# Patient Record
Sex: Female | Born: 1975 | Race: Black or African American | Hispanic: No | Marital: Single | Smoking: Current every day smoker
Health system: Southern US, Community
[De-identification: ages and names within clinical notes are randomized; demographics above are authoritative.]

## PROBLEM LIST (undated history)

## (undated) ENCOUNTER — Ambulatory Visit

## (undated) ENCOUNTER — Encounter

## (undated) ENCOUNTER — Telehealth

## (undated) ENCOUNTER — Encounter: Payer: MEDICARE | Attending: Registered" | Primary: Registered"

## (undated) ENCOUNTER — Encounter: Attending: Adult Health | Primary: Adult Health

## (undated) ENCOUNTER — Encounter: Attending: Emergency Medicine | Primary: Emergency Medicine

## (undated) ENCOUNTER — Encounter: Attending: Family | Primary: Family

## (undated) ENCOUNTER — Ambulatory Visit: Payer: MEDICARE

## (undated) ENCOUNTER — Encounter: Payer: MEDICARE | Attending: Emergency Medicine | Primary: Emergency Medicine

## (undated) ENCOUNTER — Ambulatory Visit
Attending: Student in an Organized Health Care Education/Training Program | Primary: Student in an Organized Health Care Education/Training Program

## (undated) ENCOUNTER — Ambulatory Visit: Attending: Adult Health | Primary: Adult Health

## (undated) ENCOUNTER — Ambulatory Visit: Payer: Medicare (Managed Care)

## (undated) ENCOUNTER — Telehealth: Attending: Registered" | Primary: Registered"

## (undated) ENCOUNTER — Encounter: Attending: Mental Health | Primary: Mental Health

## (undated) ENCOUNTER — Encounter: Payer: MEDICARE | Attending: Psychologist | Primary: Psychologist

## (undated) ENCOUNTER — Encounter: Attending: Diagnostic Radiology | Primary: Diagnostic Radiology

## (undated) ENCOUNTER — Ambulatory Visit: Payer: Medicare (Managed Care) | Attending: Gastroenterology | Primary: Gastroenterology

## (undated) ENCOUNTER — Encounter: Attending: Hematology & Oncology | Primary: Hematology & Oncology

## (undated) ENCOUNTER — Encounter: Payer: MEDICARE | Attending: Physical Medicine & Rehabilitation | Primary: Physical Medicine & Rehabilitation

## (undated) ENCOUNTER — Encounter: Payer: MEDICARE | Attending: Foot and Ankle Surgery | Primary: Foot and Ankle Surgery

## (undated) ENCOUNTER — Ambulatory Visit: Payer: MEDICARE | Attending: Adult Health | Primary: Adult Health

## (undated) ENCOUNTER — Encounter: Payer: MEDICARE | Attending: Adult Health | Primary: Adult Health

## (undated) ENCOUNTER — Telehealth: Attending: Family | Primary: Family

## (undated) ENCOUNTER — Encounter: Payer: Medicare (Managed Care) | Attending: Hematology & Oncology | Primary: Hematology & Oncology

## (undated) ENCOUNTER — Ambulatory Visit
Payer: MEDICARE | Attending: Student in an Organized Health Care Education/Training Program | Primary: Student in an Organized Health Care Education/Training Program

## (undated) ENCOUNTER — Encounter: Payer: Medicare (Managed Care) | Attending: Family Medicine | Primary: Family Medicine

## (undated) ENCOUNTER — Ambulatory Visit
Payer: Medicare (Managed Care) | Attending: Student in an Organized Health Care Education/Training Program | Primary: Student in an Organized Health Care Education/Training Program

## (undated) ENCOUNTER — Encounter: Attending: Family Medicine | Primary: Family Medicine

## (undated) ENCOUNTER — Ambulatory Visit: Payer: MEDICARE | Attending: Family | Primary: Family

## (undated) ENCOUNTER — Encounter
Attending: Student in an Organized Health Care Education/Training Program | Primary: Student in an Organized Health Care Education/Training Program

## (undated) ENCOUNTER — Encounter: Payer: MEDICARE | Attending: Family | Primary: Family

## (undated) ENCOUNTER — Ambulatory Visit: Payer: Medicare (Managed Care) | Attending: Adult Health | Primary: Adult Health

## (undated) ENCOUNTER — Telehealth: Attending: Physician Assistant | Primary: Physician Assistant

## (undated) ENCOUNTER — Ambulatory Visit: Payer: MEDICARE | Attending: Hematology & Oncology | Primary: Hematology & Oncology

## (undated) ENCOUNTER — Ambulatory Visit: Payer: Medicare (Managed Care) | Attending: Social Worker | Primary: Social Worker

## (undated) ENCOUNTER — Encounter: Attending: Registered" | Primary: Registered"

## (undated) ENCOUNTER — Ambulatory Visit: Payer: MEDICARE | Attending: Emergency Medicine | Primary: Emergency Medicine

## (undated) ENCOUNTER — Other Ambulatory Visit

## (undated) ENCOUNTER — Ambulatory Visit: Attending: Gastroenterology | Primary: Gastroenterology

## (undated) ENCOUNTER — Ambulatory Visit: Payer: Medicare (Managed Care) | Attending: Hematology & Oncology | Primary: Hematology & Oncology

## (undated) DIAGNOSIS — F419 Anxiety disorder, unspecified: Secondary | ICD-10-CM

## (undated) DIAGNOSIS — K449 Diaphragmatic hernia without obstruction or gangrene: Secondary | ICD-10-CM

## (undated) DIAGNOSIS — Z8679 Personal history of other diseases of the circulatory system: Secondary | ICD-10-CM

## (undated) DIAGNOSIS — F319 Bipolar disorder, unspecified: Secondary | ICD-10-CM

## (undated) DIAGNOSIS — J45909 Unspecified asthma, uncomplicated: Secondary | ICD-10-CM

## (undated) DIAGNOSIS — M810 Age-related osteoporosis without current pathological fracture: Secondary | ICD-10-CM

## (undated) DIAGNOSIS — F32A Depression, unspecified: Secondary | ICD-10-CM

## (undated) DIAGNOSIS — M5136 Other intervertebral disc degeneration, lumbar region: Secondary | ICD-10-CM

## (undated) DIAGNOSIS — E785 Hyperlipidemia, unspecified: Secondary | ICD-10-CM

## (undated) DIAGNOSIS — M51369 Other intervertebral disc degeneration, lumbar region without mention of lumbar back pain or lower extremity pain: Secondary | ICD-10-CM

## (undated) DIAGNOSIS — F329 Major depressive disorder, single episode, unspecified: Secondary | ICD-10-CM

## (undated) DIAGNOSIS — K589 Irritable bowel syndrome without diarrhea: Secondary | ICD-10-CM

## (undated) DIAGNOSIS — Z889 Allergy status to unspecified drugs, medicaments and biological substances status: Secondary | ICD-10-CM

## (undated) DIAGNOSIS — F4310 Post-traumatic stress disorder, unspecified: Secondary | ICD-10-CM

## (undated) HISTORY — DX: Age-related osteoporosis without current pathological fracture: M81.0

## (undated) HISTORY — DX: Post-traumatic stress disorder, unspecified: F43.10

## (undated) HISTORY — DX: Unspecified asthma, uncomplicated: J45.909

## (undated) HISTORY — DX: Irritable bowel syndrome, unspecified: K58.9

## (undated) HISTORY — DX: Diaphragmatic hernia without obstruction or gangrene: K44.9

## (undated) HISTORY — DX: Major depressive disorder, single episode, unspecified: F32.9

## (undated) HISTORY — PX: GALLBLADDER SURGERY: SHX652

## (undated) HISTORY — DX: Allergy status to unspecified drugs, medicaments and biological substances: Z88.9

## (undated) HISTORY — PX: TONSILLECTOMY AND ADENOIDECTOMY: SUR1326

## (undated) HISTORY — DX: Bipolar disorder, unspecified: F31.9

## (undated) HISTORY — DX: Anxiety disorder, unspecified: F41.9

## (undated) HISTORY — DX: Hyperlipidemia, unspecified: E78.5

## (undated) HISTORY — DX: Depression, unspecified: F32.A

## (undated) HISTORY — PX: ABDOMINAL HYSTERECTOMY: SUR658

## (undated) HISTORY — DX: Personal history of other diseases of the circulatory system: Z86.79

## (undated) HISTORY — PX: ABDOMINAL HYSTERECTOMY: SHX81

---
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
## 2003-10-24 ENCOUNTER — Other Ambulatory Visit: Payer: Self-pay

## 2003-12-28 ENCOUNTER — Other Ambulatory Visit: Payer: Self-pay

## 2003-12-29 ENCOUNTER — Other Ambulatory Visit: Payer: Self-pay

## 2003-12-30 ENCOUNTER — Other Ambulatory Visit: Payer: Self-pay

## 2003-12-31 ENCOUNTER — Other Ambulatory Visit: Payer: Self-pay

## 2004-01-01 ENCOUNTER — Other Ambulatory Visit: Payer: Self-pay

## 2004-05-17 ENCOUNTER — Emergency Department: Payer: Self-pay | Admitting: Emergency Medicine

## 2004-06-27 ENCOUNTER — Ambulatory Visit: Payer: Self-pay

## 2005-02-14 ENCOUNTER — Emergency Department: Payer: Self-pay | Admitting: Emergency Medicine

## 2005-05-12 ENCOUNTER — Emergency Department: Payer: Self-pay | Admitting: Emergency Medicine

## 2006-01-12 ENCOUNTER — Emergency Department: Payer: Self-pay | Admitting: Emergency Medicine

## 2006-01-12 ENCOUNTER — Other Ambulatory Visit: Payer: Self-pay

## 2006-03-02 ENCOUNTER — Emergency Department: Payer: Self-pay | Admitting: Emergency Medicine

## 2006-05-19 ENCOUNTER — Emergency Department: Payer: Self-pay | Admitting: Emergency Medicine

## 2006-06-14 ENCOUNTER — Emergency Department: Payer: Self-pay | Admitting: Emergency Medicine

## 2006-06-22 ENCOUNTER — Emergency Department: Payer: Self-pay | Admitting: Emergency Medicine

## 2006-06-25 ENCOUNTER — Emergency Department: Payer: Self-pay | Admitting: Emergency Medicine

## 2006-06-25 ENCOUNTER — Other Ambulatory Visit: Payer: Self-pay

## 2006-08-24 ENCOUNTER — Emergency Department: Payer: Self-pay | Admitting: General Practice

## 2006-10-09 ENCOUNTER — Encounter: Admission: RE | Admit: 2006-10-09 | Discharge: 2006-10-09 | Payer: Self-pay | Admitting: Family Medicine

## 2006-10-23 ENCOUNTER — Ambulatory Visit: Payer: Self-pay | Admitting: Unknown Physician Specialty

## 2006-10-24 ENCOUNTER — Emergency Department: Payer: Self-pay | Admitting: Emergency Medicine

## 2006-10-30 ENCOUNTER — Emergency Department: Payer: Self-pay | Admitting: Emergency Medicine

## 2006-11-29 ENCOUNTER — Emergency Department: Payer: Self-pay | Admitting: Emergency Medicine

## 2007-03-13 ENCOUNTER — Emergency Department: Payer: Self-pay | Admitting: Emergency Medicine

## 2007-06-04 ENCOUNTER — Emergency Department: Payer: Self-pay | Admitting: Emergency Medicine

## 2007-07-14 ENCOUNTER — Ambulatory Visit: Payer: Self-pay | Admitting: Gastroenterology

## 2007-09-20 ENCOUNTER — Emergency Department: Payer: Self-pay | Admitting: Internal Medicine

## 2007-12-05 ENCOUNTER — Emergency Department: Payer: Self-pay | Admitting: Emergency Medicine

## 2007-12-06 ENCOUNTER — Inpatient Hospital Stay: Payer: Self-pay | Admitting: Surgery

## 2009-07-26 ENCOUNTER — Emergency Department: Payer: Self-pay | Admitting: Unknown Physician Specialty

## 2009-11-21 ENCOUNTER — Emergency Department: Payer: Self-pay | Admitting: Emergency Medicine

## 2010-03-13 ENCOUNTER — Inpatient Hospital Stay: Payer: Self-pay | Admitting: Internal Medicine

## 2010-07-07 ENCOUNTER — Encounter: Payer: Self-pay | Admitting: Family Medicine

## 2011-03-07 ENCOUNTER — Emergency Department: Payer: Self-pay | Admitting: Internal Medicine

## 2011-05-23 ENCOUNTER — Emergency Department: Payer: Self-pay | Admitting: Emergency Medicine

## 2011-09-19 ENCOUNTER — Emergency Department: Payer: Self-pay | Admitting: Emergency Medicine

## 2011-12-04 ENCOUNTER — Ambulatory Visit: Payer: Self-pay | Admitting: Family Medicine

## 2012-05-11 ENCOUNTER — Emergency Department: Payer: Self-pay | Admitting: Emergency Medicine

## 2012-11-27 ENCOUNTER — Emergency Department: Payer: Self-pay | Admitting: Emergency Medicine

## 2012-11-27 LAB — BASIC METABOLIC PANEL
Creatinine: 0.93 mg/dL (ref 0.60–1.30)
EGFR (African American): >60
EGFR (Non-African Amer.): >60
Glucose: 95 mg/dL (ref 65–99)
Sodium: 140 mmol/L (ref 136–145)

## 2012-11-29 ENCOUNTER — Emergency Department: Payer: Self-pay | Admitting: Emergency Medicine

## 2013-01-30 ENCOUNTER — Emergency Department: Payer: Self-pay | Admitting: Internal Medicine

## 2013-01-30 LAB — URINALYSIS, COMPLETE
Bacteria: NONE SEEN
Bilirubin,UR: NEGATIVE
Glucose,UR: NEGATIVE mg/dL (ref 0–75)
Ph: 7 (ref 4.5–8.0)
Protein: 30
RBC,UR: 34 /HPF (ref 0–5)
Specific Gravity: 1.021 (ref 1.003–1.030)

## 2013-01-30 LAB — COMPREHENSIVE METABOLIC PANEL
Anion Gap: 3 — ABNORMAL LOW (ref 7–16)
BUN: 11 mg/dL (ref 7–18)
Calcium, Total: 9.2 mg/dL (ref 8.5–10.1)
Chloride: 109 mmol/L — ABNORMAL HIGH (ref 98–107)
Co2: 29 mmol/L (ref 21–32)
Creatinine: 0.86 mg/dL (ref 0.60–1.30)
Glucose: 91 mg/dL (ref 65–99)
Potassium: 4.1 mmol/L (ref 3.5–5.1)
SGOT(AST): 23 U/L (ref 15–37)

## 2013-01-30 LAB — CBC
HCT: 42.6 % (ref 35.0–47.0)
MCV: 92 fL (ref 80–100)
Platelet: 276 10*3/uL (ref 150–440)

## 2014-07-20 ENCOUNTER — Emergency Department: Payer: Self-pay | Admitting: Emergency Medicine

## 2014-07-20 LAB — URINALYSIS, COMPLETE
BACTERIA: NONE SEEN
Bilirubin,UR: NEGATIVE
GLUCOSE, UR: NEGATIVE mg/dL (ref 0–75)
KETONE: NEGATIVE
Leukocyte Esterase: NEGATIVE
NITRITE: NEGATIVE
PROTEIN: NEGATIVE
Ph: 7 (ref 4.5–8.0)
RBC,UR: 3 /HPF (ref 0–5)
SPECIFIC GRAVITY: 1.009 (ref 1.003–1.030)
Squamous Epithelial: 3
WBC UR: 1 /HPF (ref 0–5)

## 2014-07-20 LAB — CBC WITH DIFFERENTIAL/PLATELET
BASOS ABS: 0.1 10*3/uL (ref 0.0–0.1)
Basophil %: 1.6 %
Eosinophil #: 0.1 10*3/uL (ref 0.0–0.7)
Eosinophil %: 0.8 %
HCT: 43.9 % (ref 35.0–47.0)
HGB: 14.6 g/dL (ref 12.0–16.0)
LYMPHS PCT: 31.8 %
Lymphocyte #: 2.9 10*3/uL (ref 1.0–3.6)
MCH: 31 pg (ref 26.0–34.0)
MCHC: 33.3 g/dL (ref 32.0–36.0)
MCV: 93 fL (ref 80–100)
MONOS PCT: 7.8 %
Monocyte #: 0.7 x10 3/mm (ref 0.2–0.9)
NEUTROS PCT: 58 %
Neutrophil #: 5.2 10*3/uL (ref 1.4–6.5)
PLATELETS: 272 10*3/uL (ref 150–440)
RBC: 4.7 10*6/uL (ref 3.80–5.20)
RDW: 12.4 % (ref 11.5–14.5)
WBC: 9 10*3/uL (ref 3.6–11.0)

## 2014-07-20 LAB — COMPREHENSIVE METABOLIC PANEL
ALK PHOS: 54 U/L (ref 46–116)
ALT: 23 U/L (ref 14–63)
ANION GAP: 6 — AB (ref 7–16)
Albumin: 3.3 g/dL — ABNORMAL LOW (ref 3.4–5.0)
BILIRUBIN TOTAL: 0.5 mg/dL (ref 0.2–1.0)
BUN: 12 mg/dL (ref 7–18)
CALCIUM: 9 mg/dL (ref 8.5–10.1)
CO2: 27 mmol/L (ref 21–32)
CREATININE: 0.78 mg/dL (ref 0.60–1.30)
Chloride: 107 mmol/L (ref 98–107)
Glucose: 90 mg/dL (ref 65–99)
OSMOLALITY: 279 (ref 275–301)
Potassium: 4.1 mmol/L (ref 3.5–5.1)
SGOT(AST): 19 U/L (ref 15–37)
Sodium: 140 mmol/L (ref 136–145)
Total Protein: 7.4 g/dL (ref 6.4–8.2)

## 2014-07-20 LAB — PROTIME-INR
INR: 0.9
PROTHROMBIN TIME: 12.4 s

## 2014-07-20 LAB — APTT: Activated PTT: 27.1 secs (ref 23.6–35.9)

## 2014-07-20 LAB — TROPONIN I

## 2014-11-24 ENCOUNTER — Ambulatory Visit: Payer: Self-pay | Admitting: Cardiovascular Disease

## 2014-12-28 ENCOUNTER — Encounter: Payer: Self-pay | Admitting: *Deleted

## 2014-12-28 ENCOUNTER — Ambulatory Visit: Payer: Self-pay | Admitting: Cardiovascular Disease

## 2015-01-16 ENCOUNTER — Telehealth: Payer: Self-pay

## 2015-01-16 NOTE — Telephone Encounter (Signed)
l mom for pt to r/s no show appt

## 2015-02-01 ENCOUNTER — Ambulatory Visit: Payer: Self-pay | Admitting: Cardiovascular Disease

## 2015-03-14 ENCOUNTER — Ambulatory Visit: Payer: Self-pay | Admitting: Cardiovascular Disease

## 2015-03-26 ENCOUNTER — Encounter: Payer: Self-pay | Admitting: Cardiovascular Disease

## 2015-03-26 ENCOUNTER — Ambulatory Visit (INDEPENDENT_AMBULATORY_CARE_PROVIDER_SITE_OTHER): Payer: Medicare HMO | Admitting: Cardiovascular Disease

## 2015-03-26 VITALS — BP 110/74 | HR 58 | Ht 65.0 in | Wt 293.2 lb

## 2015-03-26 DIAGNOSIS — I2584 Coronary atherosclerosis due to calcified coronary lesion: Secondary | ICD-10-CM

## 2015-03-26 DIAGNOSIS — Z8679 Personal history of other diseases of the circulatory system: Secondary | ICD-10-CM | POA: Diagnosis not present

## 2015-03-26 DIAGNOSIS — R0602 Shortness of breath: Secondary | ICD-10-CM | POA: Diagnosis not present

## 2015-03-26 DIAGNOSIS — I2510 Atherosclerotic heart disease of native coronary artery without angina pectoris: Secondary | ICD-10-CM
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
## 2015-03-26 NOTE — Patient Instructions (Addendum)
Medication Instructions:  Your physician recommends that you continue on your current medications as directed. Please refer to the Current Medication list given to you today.   Labwork: none  Testing/Procedures: Your physician has requested that you have an echocardiogram. Echocardiography is a painless test that uses sound waves to create images of your heart. It provides your doctor with information about the size and shape of your heart and how well your heart's chambers and valves are working. This procedure takes approximately one hour. There are no restrictions for this procedure.  Your physician has requested that you have a lexiscan myoview. For further information please visit [email or web address] Please follow instruction sheet, as given.  [facility] MYOVIEW  Your caregiver has ordered a Stress Test with nuclear imaging. The purpose of this test is to evaluate the blood supply to your heart muscle. This procedure is referred to as a "Non-Invasive Stress Test." This is because other than having an IV started in your vein, nothing is inserted or "invades" your body. Cardiac stress tests are done to find areas of poor blood flow to the heart by determining the extent of coronary artery disease (CAD). Some patients exercise on a treadmill, which naturally increases the blood flow to your heart, while others who are  unable to walk on a treadmill due to physical limitations have a pharmacologic/chemical stress agent called Lexiscan . This medicine will mimic walking on a treadmill by temporarily increasing your coronary blood flow.   Please note: these test may take anywhere between 2-4 hours to complete  PLEASE REPORT TO [facility] ENTRANCE  THE VOLUNTEERS AT THE FIRST DESK WILL DIRECT YOU WHERE TO GO  Date of Procedure: Mon and Tues, Oct 17 and 18, 8:30am  Arrival Time for Procedure: 8:00am  Instructions regarding medication:   You may take all of your morning medications with a  sip of water  PLEASE NOTIFY THE OFFICE AT LEAST 24 HOURS IN ADVANCE IF YOU ARE UNABLE TO KEEP YOUR APPOINTMENT.  [phone] AND  PLEASE NOTIFY NUCLEAR MEDICINE AT [facility] AT LEAST 24 HOURS IN ADVANCE IF YOU ARE UNABLE TO KEEP YOUR APPOINTMENT. [phone]  How to prepare for your Myoview test:   Do not eat or drink after midnight  No caffeine for 24 hours prior to test  No smoking 24 hours prior to test.  Your medication may be taken with water.  If your doctor stopped a medication because of this test, do not take that medication.  Ladies, please do not wear dresses.  Skirts or pants are appropriate. Please wear a short sleeve shirt.  No perfume, cologne or lotion.  Wear comfortable walking shoes. No heels!            Follow-Up: Your physician recommends that you schedule a follow-up appointment with Dr. Elease Hashimoto as needed   Any Other Special Instructions Will Be Listed Below (If Applicable).  Echocardiogram An echocardiogram, or echocardiography, uses sound waves (ultrasound) to produce an image of your heart. The echocardiogram is simple, painless, obtained within a short period of time, and offers valuable information to your health care provider. The images from an echocardiogram can provide information such as:  Evidence of coronary artery disease (CAD).  Heart size.  Heart muscle function.  Heart valve function.  Aneurysm detection.  Evidence of a past heart attack.  Fluid buildup around the heart.  Heart muscle thickening.  Assess heart valve function. LET [facility] CARE PROVIDER KNOW ABOUT:  Any allergies you have.  All medicines you are taking, including vitamins, herbs, eye drops, creams, and over-the-counter medicines.  Previous problems you or members of your family have had with the use of anesthetics.  Any blood disorders you have.  Previous surgeries you have had.  Medical conditions you have.  Possibility of pregnancy, if this  applies. BEFORE THE PROCEDURE  No special preparation is needed. Eat and drink normally.  PROCEDURE   In order to produce an image of your heart, gel will be applied to your chest and a wand-like tool (transducer) will be moved over your chest. The gel will help transmit the sound waves from the transducer. The sound waves will harmlessly bounce off your heart to allow the heart images to be captured in real-time motion. These images will then be recorded.  You may need an IV to receive a medicine that improves the quality of the pictures. AFTER THE PROCEDURE You may return to your normal schedule including diet, activities, and medicines, unless your health care provider tells you otherwise.   This information is not intended to replace advice given to you by your health care provider. Make sure you discuss any questions you have with your health care provider.   Document Released: 05/30/2000 Document Revised: 06/23/2014 Document Reviewed: 02/07/2013 Elsevier Interactive Patient Education [employer]

## 2015-03-30 ENCOUNTER — Telehealth: Payer: Self-pay

## 2015-03-30 NOTE — Telephone Encounter (Signed)
Left detailed message on pt cell regarding 10/17 and 10/18 myoview. Reviewed instructions, left CB number if questions.

## 2015-04-02 ENCOUNTER — Ambulatory Visit: Payer: Medicare HMO

## 2015-04-02 ENCOUNTER — Ambulatory Visit: Admission: RE | Admit: 2015-04-02 | Payer: Medicare HMO | Source: Ambulatory Visit

## 2015-04-03 ENCOUNTER — Ambulatory Visit: Admission: RE | Admit: 2015-04-03 | Payer: Medicare HMO | Source: Ambulatory Visit

## 2015-04-16 ENCOUNTER — Encounter: Payer: Self-pay | Admitting: *Deleted

## 2015-04-16 ENCOUNTER — Other Ambulatory Visit: Payer: Self-pay

## 2015-04-16 DIAGNOSIS — R0989 Other specified symptoms and signs involving the circulatory and respiratory systems: Secondary | ICD-10-CM

## 2015-05-20 ENCOUNTER — Emergency Department
Admission: EM | Admit: 2015-05-20 | Discharge: 2015-05-20 | Disposition: A | Discharge status: Home / Self Care | Payer: Medicare HMO | Attending: Emergency Medicine | Admitting: Emergency Medicine

## 2015-05-20 ENCOUNTER — Encounter: Payer: Self-pay | Admitting: Emergency Medicine

## 2015-05-20 DIAGNOSIS — E785 Hyperlipidemia, unspecified: Secondary | ICD-10-CM | POA: Diagnosis not present

## 2015-05-20 DIAGNOSIS — Z79899 Other long term (current) drug therapy: Secondary | ICD-10-CM | POA: Insufficient documentation

## 2015-05-20 DIAGNOSIS — M5416 Radiculopathy, lumbar region: Secondary | ICD-10-CM | POA: Diagnosis not present

## 2015-05-20 DIAGNOSIS — M545 Low back pain: Secondary | ICD-10-CM | POA: Diagnosis present

## 2015-05-20 DIAGNOSIS — F17210 Nicotine dependence, cigarettes, uncomplicated: Secondary | ICD-10-CM | POA: Diagnosis not present

## 2015-05-20 DIAGNOSIS — Z880 Allergy status to penicillin: Secondary | ICD-10-CM | POA: Diagnosis not present

## 2015-05-20 DIAGNOSIS — Z792 Long term (current) use of antibiotics: Secondary | ICD-10-CM | POA: Diagnosis not present

## 2015-05-20 HISTORY — DX: Other intervertebral disc degeneration, lumbar region: M51.36

## 2015-05-20 HISTORY — DX: Other intervertebral disc degeneration, lumbar region without mention of lumbar back pain or lower extremity pain: M51.369

## 2015-05-20 MED ORDER — OXYCODONE-ACETAMINOPHEN 7.5-325 MG PO TABS: 1.0000 | ORAL_TABLET | ORAL | Status: DC | PRN

## 2015-05-20 MED ORDER — METHOCARBAMOL 500 MG PO TABS
1000.0000 mg | ORAL_TABLET | Freq: Once | ORAL | Status: AC
Start: 2015-05-20 — End: 2015-05-20
  Administered 2015-05-20: 1000 mg via ORAL
  Filled 2015-05-20: qty 2

## 2015-05-20 MED ORDER — METHYLPREDNISOLONE 4 MG PO TBPK: ORAL_TABLET | ORAL | Status: DC

## 2015-05-20 MED ORDER — DEXAMETHASONE SODIUM PHOSPHATE 10 MG/ML IJ SOLN
10.0000 mg | Freq: Once | INTRAMUSCULAR | Status: AC
  Administered 2015-05-20: 10 mg via INTRAMUSCULAR
  Filled 2015-05-20: qty 1

## 2015-05-20 MED ORDER — HYDROMORPHONE HCL 1 MG/ML IJ SOLN
1.0000 mg | Freq: Once | INTRAMUSCULAR | Status: AC
  Administered 2015-05-20: 1 mg via INTRAMUSCULAR
  Filled 2015-05-20: qty 1

## 2015-05-20 MED ORDER — ORPHENADRINE CITRATE ER 100 MG PO TB12: 100.0000 mg | ORAL_TABLET | Freq: Two times a day (BID) | ORAL | Status: DC

## 2015-05-20 NOTE — Discharge Instructions (Signed)
Radicular Pain °Radicular pain in either the arm or leg is usually from a bulging or herniated disk in the spine. A piece of the herniated disk may press against the nerves as the nerves exit the spine. This causes pain which is felt at the tips of the nerves down the arm or leg. Other causes of radicular pain may include: °· Fractures. °· Heart disease. °· Cancer. °· An abnormal and usually degenerative state of the nervous system or nerves (neuropathy). °Diagnosis may require CT or MRI scanning to determine the primary cause.  °Nerves that start at the neck (nerve roots) may cause radicular pain in the outer shoulder and arm. It can spread down to the thumb and fingers. The symptoms vary depending on which nerve root has been affected. In most cases radicular pain improves with conservative treatment. Neck problems may require physical therapy, a neck collar, or cervical traction. Treatment may take many weeks, and surgery may be considered if the symptoms do not improve.  °Conservative treatment is also recommended for sciatica. Sciatica causes pain to radiate from the lower back or buttock area down the leg into the foot. Often there is a history of back problems. Most patients with sciatica are better after 2 to 4 weeks of rest and other supportive care. Short term bed rest can reduce the disk pressure considerably. Sitting, however, is not a good position since this increases the pressure on the disk. You should avoid bending, lifting, and all other activities which make the problem worse. Traction can be used in severe cases. Surgery is usually reserved for patients who do not improve within the first months of treatment. °Only take over-the-counter or prescription medicines for pain, discomfort, or fever as directed by your caregiver. Narcotics and muscle relaxants may help by relieving more severe pain and spasm and by providing mild sedation. Cold or massage can give significant relief. Spinal manipulation  is not recommended. It can increase the degree of disc protrusion. Epidural steroid injections are often effective treatment for radicular pain. These injections deliver medicine to the spinal nerve in the space between the protective covering of the spinal cord and back bones (vertebrae). Your caregiver can give you more information about steroid injections. These injections are most effective when given within two weeks of the onset of pain.  °You should see your caregiver for follow up care as recommended. A program for neck and back injury rehabilitation with stretching and strengthening exercises is an important part of management.  °SEEK IMMEDIATE MEDICAL CARE IF: °· You develop increased pain, weakness, or numbness in your arm or leg. °· You develop difficulty with bladder or bowel control. °· You develop abdominal pain. °  °This information is not intended to replace advice given to you by your health care provider. Make sure you discuss any questions you have with your health care provider. °  °Document Released: 07/10/2004 Document Revised: 06/23/2014 Document Reviewed: 12/27/2014 °Elsevier Interactive Patient Education ©2016 Elsevier Inc. ° °

## 2015-05-20 NOTE — ED Notes (Signed)
Pt reports pain in lower back and both legs after turning while moping the floor.  Pt states numbness and pain in both legs.  Pt did not fall.
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
## 2015-06-07 ENCOUNTER — Other Ambulatory Visit: Payer: Medicare HMO

## 2015-06-07 ENCOUNTER — Other Ambulatory Visit: Payer: Self-pay | Admitting: Cardiovascular Disease

## 2015-06-07 DIAGNOSIS — Z8679 Personal history of other diseases of the circulatory system: Secondary | ICD-10-CM

## 2015-06-07 DIAGNOSIS — I059 Rheumatic mitral valve disease, unspecified: Secondary | ICD-10-CM

## 2015-06-16 ENCOUNTER — Emergency Department
Admission: EM | Admit: 2015-06-16 | Discharge: 2015-06-16 | Disposition: A | Discharge status: Home / Self Care | Payer: Medicare HMO | Attending: Emergency Medicine | Admitting: Emergency Medicine

## 2015-06-16 ENCOUNTER — Emergency Department: Payer: Medicare HMO

## 2015-06-16 DIAGNOSIS — J209 Acute bronchitis, unspecified: Secondary | ICD-10-CM

## 2015-06-16 DIAGNOSIS — F17210 Nicotine dependence, cigarettes, uncomplicated: Secondary | ICD-10-CM | POA: Diagnosis not present

## 2015-06-16 DIAGNOSIS — Z7951 Long term (current) use of inhaled steroids: Secondary | ICD-10-CM | POA: Diagnosis not present

## 2015-06-16 DIAGNOSIS — Z792 Long term (current) use of antibiotics: Secondary | ICD-10-CM | POA: Insufficient documentation

## 2015-06-16 DIAGNOSIS — R1110 Vomiting, unspecified: Secondary | ICD-10-CM | POA: Diagnosis not present

## 2015-06-16 DIAGNOSIS — Z79899 Other long term (current) drug therapy: Secondary | ICD-10-CM | POA: Insufficient documentation

## 2015-06-16 DIAGNOSIS — J45901 Unspecified asthma with (acute) exacerbation: Secondary | ICD-10-CM | POA: Insufficient documentation

## 2015-06-16 DIAGNOSIS — Z880 Allergy status to penicillin: Secondary | ICD-10-CM | POA: Diagnosis not present

## 2015-06-16 DIAGNOSIS — R05 Cough: Secondary | ICD-10-CM | POA: Diagnosis present

## 2015-06-16 MED ORDER — IPRATROPIUM-ALBUTEROL 0.5-2.5 (3) MG/3ML IN SOLN
RESPIRATORY_TRACT | Status: AC
  Filled 2015-06-16: qty 3

## 2015-06-16 MED ORDER — PREDNISONE 20 MG PO TABS
60.0000 mg | ORAL_TABLET | Freq: Once | ORAL | Status: AC
  Administered 2015-06-16: 60 mg via ORAL
  Filled 2015-06-16: qty 3

## 2015-06-16 MED ORDER — IPRATROPIUM-ALBUTEROL 0.5-2.5 (3) MG/3ML IN SOLN: 3.0000 mL | RESPIRATORY_TRACT | Status: DC | PRN

## 2015-06-16 MED ORDER — PREDNISONE 10 MG PO TABS: ORAL_TABLET | ORAL | Status: DC

## 2015-06-16 MED ORDER — IPRATROPIUM-ALBUTEROL 0.5-2.5 (3) MG/3ML IN SOLN
3.0000 mL | Freq: Once | RESPIRATORY_TRACT | Status: AC
  Administered 2015-06-16: 3 mL via RESPIRATORY_TRACT
  Filled 2015-06-16: qty 3

## 2015-06-16 MED ORDER — HYDROCOD POLST-CPM POLST ER 10-8 MG/5ML PO SUER: 5.0000 mL | Freq: Two times a day (BID) | ORAL | Status: DC

## 2015-06-16 MED ORDER — IPRATROPIUM-ALBUTEROL 0.5-2.5 (3) MG/3ML IN SOLN
3.0000 mL | Freq: Once | RESPIRATORY_TRACT | Status: AC
  Administered 2015-06-16: 3 mL via RESPIRATORY_TRACT

## 2015-06-16 MED ORDER — AZITHROMYCIN 250 MG PO TABS: ORAL_TABLET | ORAL | Status: DC

## 2015-06-16 NOTE — ED Notes (Signed)
Pt here for cough, congestion, vomiting that has been ongoing since Monday.  Has been taking antibiotics for sinus infection.

## 2015-06-16 NOTE — ED Provider Notes (Signed)
[facility] Emergency Department Provider Note  ____________________________________________  Time seen: Approximately 11:08 AM  I have reviewed the triage vital signs and the nursing notes.   HISTORY  Chief Complaint Cough; Nasal Congestion; and Emesis  HPI Kendra Clark is a 39 y.o. female is here with complaint of cough and congestion for 5 days. Patient states that she has vomiting only with coughing. She denies any diarrhea. She states she has been running fever at home but has not actually taken it. She has been taking multiple over-the-counter medications for cough and congestion. Patient is an asthmatic and has continued using her inhalers. Patient continues to smoke as well.She states that she used her inhaler prior to coming to the emergency room. She states her chest hurts with coughing, talking and taking deep breaths. Patient had leftover amoxicillin from a dental visit and began taking it 3 days ago. She is now out of antibiotics.   Past Medical History  Diagnosis Date  . History of mitral valve disease   . IBS (irritable bowel syndrome)   . Hyperlipidemia   . Osteoporosis   . Depression   . Asthma   . Hiatal hernia   . Atopy   . Degenerative disc disease, lumbar     Patient Active Problem List   Diagnosis Date Noted  . Coronary artery calcification 03/26/2015  . Morbid obesity (HCC) 03/26/2015    Past Surgical History  Procedure Laterality Date  . Abdominal hysterectomy    . Cesarean section      x 3     Current Outpatient Rx  Name  Route  Sig  Dispense  Refill  . AMOXICILLIN PO   Oral   Take by mouth.         Marland Kitchen azithromycin (ZITHROMAX Z-PAK) 250 MG tablet      Take 2 tablets (500 mg) on  Day 1,  followed by 1 tablet (250 mg) once daily on Days 2 through 5.   6 each   0   . chlorpheniramine-HYDROcodone (TUSSIONEX PENNKINETIC ER) 10-8 MG/5ML SUER   Oral   Take 5 mLs by mouth 2 (two) times daily.   100 mL   0   .  clindamycin (CLEOCIN) 300 MG capsule   Oral   Take 300 mg by mouth 2 (two) times daily.          . fluconazole (DIFLUCAN) 150 MG tablet   Oral   Take 150 mg by mouth once.          Marland Kitchen ibuprofen (ADVIL,MOTRIN) 600 MG tablet   Oral   Take by mouth every 6 (six) hours as needed.          Marland Kitchen ipratropium-albuterol (DUONEB) 0.5-2.5 (3) MG/3ML SOLN   Nebulization   Take 3 mLs by nebulization every 4 (four) hours as needed.   360 mL   0   . lubiprostone (AMITIZA) 24 MCG capsule   Oral   Take 24 mcg by mouth 2 (two) times daily with a meal.          . methylPREDNISolone (MEDROL DOSEPAK) 4 MG TBPK tablet      Take Tapered dose as directed   21 tablet   0   . orphenadrine (NORFLEX) 100 MG tablet   Oral   Take 1 tablet (100 mg total) by mouth 2 (two) times daily.   10 tablet   0   . oxyCODONE-acetaminophen (PERCOCET) 7.5-325 MG tablet   Oral   Take 1  tablet by mouth every 4 (four) hours as needed for severe pain.   20 tablet   0   . predniSONE (DELTASONE) 10 MG tablet      Take 5 tablets  tomorrow, on day 2 take 4 tablets, day 3 take 3 tablets, day 4 take 2 tablets, day 5 take  1 tablets   21 tablet   0     Allergies Penicillins and Tramadol  Family History  Problem Relation Age of Onset  . Hypertension Father   . Hyperlipidemia Father   . Hypertension Mother   . Hyperlipidemia Mother   . Arrhythmia Mother     A-fib    Social History Social History  Substance Use Topics  . Smoking status: Current Every Day Smoker -- 1.50 packs/day for 13 years    Types: Cigarettes  . Smokeless tobacco: Not on file  . Alcohol Use: No    Review of Systems Constitutional: Positive fever/chills Eyes: No visual changes. ENT: No sore throat. Cardiovascular: Denies chest pain. Respiratory: Positive shortness of breath and wheezing. Gastrointestinal: No abdominal pain.  No nausea, positive vomiting only with coughing.  No diarrhea.   Genitourinary: Negative for  dysuria. Musculoskeletal: Negative for back pain. Skin: Negative for rash. Neurological: Negative for headaches, focal weakness or numbness.  10-point ROS otherwise negative.  ____________________________________________   PHYSICAL EXAM:  VITAL SIGNS: ED Triage Vitals  Enc Vitals Group     BP 06/16/15 1056 124/68 mmHg     Pulse Rate 06/16/15 1055 74     Resp 06/16/15 1055 20     Temp --      Temp src --      SpO2 06/16/15 1055 95 %     Weight 06/16/15 1055 280 lb (127.007 kg)     Height 06/16/15 1055  (1.651 m)     Head Cir --      Peak Flow --      Pain Score 06/16/15 1059 9     Pain Loc --      Pain Edu? --      Excl. in GC? --     Constitutional: Alert and oriented. Well appearing and in no acute distress. Eyes: Conjunctivae are normal. PERRL. EOMI. Head: Atraumatic. Nose: Mild congestion/rhinnorhea.    EACs and TMs are clear bilaterally. Mouth/Throat: Mucous membranes are moist.  Oropharynx non-erythematous. Neck: No stridor.  Supple Hematological/Lymphatic/Immunilogical: No cervical lymphadenopathy. Cardiovascular: Normal rate, regular rhythm. Grossly normal heart sounds.  Good peripheral circulation. Respiratory: Normal respiratory effort.  No retractions or accessory muscles. Lungs bilateral expiratory wheezes heard throughout. Congestive cough. Gastrointestinal: Soft and nontender. No distention.  Musculoskeletal: No lower extremity tenderness nor edema.  No joint effusions. Neurologic:  Normal speech and language. No gross focal neurologic deficits are appreciated. No gait instability. Skin:  Skin is warm, dry and intact. No rash noted. Psychiatric: Mood and affect are normal. Speech and behavior are normal.  ____________________________________________   LABS (all labs ordered are listed, but only abnormal results are displayed)  Labs Reviewed - No data to display RADIOLOGY  X-rays x-ray per radiologist shows bronchitic changes. I, Tommi Rumps, personally viewed and evaluated these images (plain radiographs) as part of my medical decision making, as well as reviewing the written report by the radiologist. ____________________________________________   PROCEDURES  Procedure(s) performed: None  Critical Care performed: No  ____________________________________________   INITIAL IMPRESSION / ASSESSMENT AND PLAN / ED COURSE  Pertinent labs & imaging results that were available  during my care of the patient were reviewed by me and considered in my medical decision making (see chart for details).  Patient was given prednisone while in the emergency room along with 2 nebulizer treatments. Zithromax Pak, Tussionex, prednisone, and DuoNeb solution for her nebulizer machine at home. Patient improved while in the emergency room. ____________________________________________   FINAL CLINICAL IMPRESSION(S) / ED DIAGNOSES  Final diagnoses:  Acute bronchitis, unspecified organism  Cigarette smoker  Asthma exacerbation      Tommi RumpsRhonda L Summers, PA-C 06/16/15 1301  Darien Ramusavid W Kaminski, MD 06/16/15 [phone]

## 2015-06-16 NOTE — Discharge Instructions (Signed)
Acute Bronchitis Bronchitis is when the airways that extend from the windpipe into the lungs get red, puffy, and painful (inflamed). Bronchitis often causes thick spit (mucus) to develop. This leads to a cough. A cough is the most common symptom of bronchitis. In acute bronchitis, the condition usually begins suddenly and goes away over time (usually in 2 weeks). Smoking, allergies, and asthma can make bronchitis worse. Repeated episodes of bronchitis may cause more lung problems. HOME CARE  Rest.  Drink enough fluids to keep your pee (urine) clear or pale yellow (unless you need to limit fluids as told by your doctor).  Only take over-the-counter or prescription medicines as told by your doctor.  Avoid smoking and secondhand smoke. These can make bronchitis worse. If you are a smoker, think about using nicotine gum or skin patches. Quitting smoking will help your lungs heal faster.  Reduce the chance of getting bronchitis again by:  Washing your hands often.  Avoiding people with cold symptoms.  Trying not to touch your hands to your mouth, nose, or eyes.  Follow up with your doctor as told. GET HELP IF: Your symptoms do not improve after 1 week of treatment. Symptoms include:  Cough.  Fever.  Coughing up thick spit.  Body aches.  Chest congestion.  Chills.  Shortness of breath.  Sore throat. GET HELP RIGHT AWAY IF:   You have an increased fever.  You have chills.  You have severe shortness of breath.  You have bloody thick spit (sputum).  You throw up (vomit) often.  You lose too much body fluid (dehydration).  You have a severe headache.  You faint. MAKE SURE YOU:   Understand these instructions.  Will watch your condition.  Will get help right away if you are not doing well or get worse.   This information is not intended to replace advice given to you by your health care provider. Make sure you discuss any questions you have with your health care  provider.   Document Released: 11/19/2007 Document Revised: 02/02/2013 Document Reviewed: 11/23/2012 Elsevier Interactive Patient Education [employer]2016 [employer]   [facility] first of the week if not improving. Use nebulizer machine at home as directed. Tussionex twice a day only as needed for coughing. Prednisone as directed. Zithromax began today. Increase fluids.

## 2015-06-16 NOTE — ED Notes (Signed)
Discussed discharge instructions, prescriptions, and follow-up care with patient. No questions or concerns at this time. Pt stable at discharge.  

## 2015-06-16 NOTE — ED Notes (Signed)
Patient transported to X-ray 

## 2015-06-20 ENCOUNTER — Other Ambulatory Visit: Payer: Medicare HMO

## 2015-06-21 ENCOUNTER — Other Ambulatory Visit: Payer: Self-pay | Admitting: Cardiovascular Disease

## 2015-06-21 DIAGNOSIS — R0609 Other forms of dyspnea: Secondary | ICD-10-CM

## 2015-06-21 DIAGNOSIS — Z8679 Personal history of other diseases of the circulatory system: Secondary | ICD-10-CM

## 2015-06-21 DIAGNOSIS — I059 Rheumatic mitral valve disease, unspecified: Secondary | ICD-10-CM

## 2015-06-22 ENCOUNTER — Other Ambulatory Visit: Payer: Self-pay

## 2015-06-22 ENCOUNTER — Ambulatory Visit (INDEPENDENT_AMBULATORY_CARE_PROVIDER_SITE_OTHER): Payer: Medicare HMO

## 2015-06-22 DIAGNOSIS — I059 Rheumatic mitral valve disease, unspecified: Secondary | ICD-10-CM

## 2015-06-22 DIAGNOSIS — R0609 Other forms of dyspnea: Secondary | ICD-10-CM

## 2015-06-22 DIAGNOSIS — Z8679 Personal history of other diseases of the circulatory system: Secondary | ICD-10-CM | POA: Diagnosis not present

## 2016-02-29 ENCOUNTER — Emergency Department: Payer: Medicare HMO

## 2016-02-29 ENCOUNTER — Emergency Department
Admission: EM | Admit: 2016-02-29 | Discharge: 2016-02-29 | Disposition: A | Discharge status: Home / Self Care | Payer: Medicare HMO | Attending: Emergency Medicine | Admitting: Emergency Medicine

## 2016-02-29 ENCOUNTER — Encounter: Payer: Self-pay | Admitting: Emergency Medicine

## 2016-02-29 DIAGNOSIS — J069 Acute upper respiratory infection, unspecified: Secondary | ICD-10-CM | POA: Diagnosis not present

## 2016-02-29 DIAGNOSIS — Z792 Long term (current) use of antibiotics: Secondary | ICD-10-CM | POA: Insufficient documentation

## 2016-02-29 DIAGNOSIS — J45901 Unspecified asthma with (acute) exacerbation: Secondary | ICD-10-CM | POA: Diagnosis not present

## 2016-02-29 DIAGNOSIS — R05 Cough: Secondary | ICD-10-CM | POA: Diagnosis present

## 2016-02-29 DIAGNOSIS — Z79899 Other long term (current) drug therapy: Secondary | ICD-10-CM | POA: Diagnosis not present

## 2016-02-29 DIAGNOSIS — M549 Dorsalgia, unspecified: Secondary | ICD-10-CM | POA: Diagnosis not present

## 2016-02-29 DIAGNOSIS — F17210 Nicotine dependence, cigarettes, uncomplicated: Secondary | ICD-10-CM | POA: Insufficient documentation

## 2016-02-29 DIAGNOSIS — B9789 Other viral agents as the cause of diseases classified elsewhere: Secondary | ICD-10-CM

## 2016-02-29 MED ORDER — IPRATROPIUM-ALBUTEROL 0.5-2.5 (3) MG/3ML IN SOLN
3.0000 mL | Freq: Once | RESPIRATORY_TRACT | Status: AC
  Administered 2016-02-29: 3 mL via RESPIRATORY_TRACT

## 2016-02-29 MED ORDER — IPRATROPIUM-ALBUTEROL 0.5-2.5 (3) MG/3ML IN SOLN
RESPIRATORY_TRACT | Status: AC
  Filled 2016-02-29: qty 3

## 2016-02-29 MED ORDER — LORATADINE 10 MG PO TABS: 10.0000 mg | ORAL_TABLET | Freq: Every day | ORAL | 0 refills | Status: DC

## 2016-02-29 MED ORDER — PROMETHAZINE-DM 6.25-15 MG/5ML PO SYRP: 5.0000 mL | ORAL_SOLUTION | Freq: Four times a day (QID) | ORAL | 0 refills | Status: DC | PRN

## 2016-02-29 MED ORDER — IPRATROPIUM-ALBUTEROL 0.5-2.5 (3) MG/3ML IN SOLN: 3.0000 mL | Freq: Four times a day (QID) | RESPIRATORY_TRACT | 0 refills | Status: DC | PRN

## 2016-02-29 NOTE — Discharge Instructions (Signed)
Please use your nebulizer as needed.

## 2016-02-29 NOTE — ED Triage Notes (Signed)
Back pain and cough started last night.

## 2016-02-29 NOTE — ED Provider Notes (Signed)
[facility] Emergency Department Provider Note  ____________________________________________  Time seen: Approximately 4:02 PM  I have reviewed the triage vital signs and the nursing notes.   HISTORY  Chief Complaint Cough and Back Pain    HPI Kendra Clark is a 40 y.o. female , NAD, presents to the emergency department for evaluation of cough and back pain that began last night.  Patient states that last night she began wheezing with, and a cough and chest tightness.  States that when she coughs it causes her back to hurt and she can occasionally have a headache.  States back pain is chronic but it worsens with cough. Symptoms have persisted through the day today causing a family member to be concerned so they called EMS.  Patient states she was given an albuterol treatment and EMS was seemed help. She denies fever, chills, body aches, rash, neck stiffness, changes in vision, chest pain, palpitations, abdominal pain, nausea, vomiting, changes in urination or bowel habits. Has had no numbness, weakness, tingling. Has not taken anything over-the-counter for her symptoms. States that she uses [employer] twice daily and has another albuterol inhaler that she uses as needed. Also has a nebulizer at home with medications but has not used it in 2 days. No known sick contacts.   Past Medical History:  Diagnosis Date  . Asthma   . Atopy   . Degenerative disc disease, lumbar   . Depression   . Hiatal hernia   . History of mitral valve disease   . Hyperlipidemia   . IBS (irritable bowel syndrome)   . Osteoporosis     Patient Active Problem List   Diagnosis Date Noted  . Coronary artery calcification 03/26/2015  . Morbid obesity (HCC) 03/26/2015    Past Surgical History:  Procedure Laterality Date  . ABDOMINAL HYSTERECTOMY    . CESAREAN SECTION     x 3     Prior to Admission medications   Medication Sig Start Date End Date Taking? Authorizing Provider   AMOXICILLIN PO Take by mouth.    Historical Provider, MD  azithromycin (ZITHROMAX Z-PAK) 250 MG tablet Take 2 tablets (500 mg) on  Day 1,  followed by 1 tablet (250 mg) once daily on Days 2 through 5. 06/16/15   Tommi Rumps, PA-C  chlorpheniramine-HYDROcodone (TUSSIONEX PENNKINETIC ER) 10-8 MG/5ML SUER Take 5 mLs by mouth 2 (two) times daily. 06/16/15   Tommi Rumps, PA-C  clindamycin (CLEOCIN) 300 MG capsule Take 300 mg by mouth 2 (two) times daily.  03/21/15   Historical Provider, MD  fluconazole (DIFLUCAN) 150 MG tablet Take 150 mg by mouth once.  04/06/10   Historical Provider, MD  ibuprofen (ADVIL,MOTRIN) 600 MG tablet Take by mouth every 6 (six) hours as needed.  03/21/15   Historical Provider, MD  ipratropium-albuterol (DUONEB) 0.5-2.5 (3) MG/3ML SOLN Take 3 mLs by nebulization every 6 (six) hours as needed. 02/29/16   Jami L Hagler, PA-C  loratadine (CLARITIN) 10 MG tablet Take 1 tablet (10 mg total) by mouth daily. 02/29/16   Jami L Hagler, PA-C  lubiprostone (AMITIZA) 24 MCG capsule Take 24 mcg by mouth 2 (two) times daily with a meal.  06/25/10   Historical Provider, MD  methylPREDNISolone (MEDROL DOSEPAK) 4 MG TBPK tablet Take Tapered dose as directed 05/20/15   [name], PA-C  orphenadrine (NORFLEX) 100 MG tablet Take 1 tablet (100 mg total) by mouth 2 (two) times daily. 05/20/15   [name],  PA-C  oxyCODONE-acetaminophen (PERCOCET) 7.5-325 MG tablet Take 1 tablet by mouth every 4 (four) hours as needed for severe pain. 05/20/15   [name], PA-C  predniSONE (DELTASONE) 10 MG tablet Take 5 tablets  tomorrow, on day 2 take 4 tablets, day 3 take 3 tablets, day 4 take 2 tablets, day 5 take  1 tablets 06/16/15   Tommi Rumps, PA-C  promethazine-dextromethorphan (PROMETHAZINE-DM) 6.25-15 MG/5ML syrup Take 5 mLs by mouth 4 (four) times daily as needed for cough. 02/29/16   Jami L Hagler, PA-C    Allergies Penicillins and Tramadol  Family History  Problem Relation Age  of Onset  . Hypertension Father   . Hyperlipidemia Father   . Hypertension Mother   . Hyperlipidemia Mother   . Arrhythmia Mother     A-fib    Social History Social History  Substance Use Topics  . Smoking status: Current Every Day Smoker    Packs/day: 1.50    Years: 13.00    Types: Cigarettes  . Smokeless tobacco: Never Used  . Alcohol use No     Review of Systems  Constitutional: No fever/chills, Fatigue Eyes: No visual changes. No discharge ENT: No sore throat, nasal congestion, runny nose, sinus pressure, ear pain. Cardiovascular: No chest pain. Respiratory: Positive cough, shortness breath, wheezing. Gastrointestinal: No abdominal pain.  No nausea, vomiting.  No diarrhea. . Musculoskeletal: Positive for back pain. No general myalgias, neck pain Skin: Negative for rash. Neurological: Positive for headaches, but no focal weakness or numbness. No tingling, saddle paresthesias, loss of bowel or bladder control. 10-point ROS otherwise negative.  ____________________________________________   PHYSICAL EXAM:  VITAL SIGNS: ED Triage Vitals [02/29/16 1447]  Enc Vitals Group     BP 105/64     Pulse Rate 71     Resp 18     Temp 98.3 F (36.8 C)     Temp Source Oral     SpO2 99 %     Weight 289 lb (131.1 kg)     Height 5\' 5"  (1.651 m)     Head Circumference      Peak Flow      Pain Score 10     Pain Loc      Pain Edu?      Excl. in GC?      Constitutional: Alert and oriented. Well appearing and in no acute distress. Eyes: Conjunctivae are normal without icterus or injection Head: Atraumatic. ENT:      Ears: TMs visualized bilaterally without erythema, effusion, bulging or perforation      Nose: Trace congestion with trace clear rhinorrhea.      Mouth/Throat: Mucous membranes are moist. Pharynx without erythema, swelling, exudate. Airways patent. Uvula is midline. Clear postnasal drip. Neck: Supple with FROM Hematological/Lymphatic/Immunilogical: No cervical  lymphadenopathy. Cardiovascular: Normal rate, regular rhythm. Normal S1 and S2.  Good peripheral circulation. Respiratory: Normal respiratory effort without tachypnea or retractions. Lungs with trace wheeze throughout but no rhonchi or rales. Breath sounds noted in all lung fields. Neurologic:  Normal speech and language. No gross focal neurologic deficits are appreciated.  Skin:  Skin is warm, dry and intact. No rash noted. Psychiatric: Mood and affect are normal. Speech and behavior are normal. Patient exhibits appropriate insight and judgement.   ____________________________________________   LABS  None ____________________________________________  EKG  EKG reveals normal sinus rhythm with a ventricular rate of 78 bpm. No acute changes or evidence of STEMI. EKG was compared to EKG completed in October  2016 and they are with the same electrical findings. No changes noted on today's EKG as compared to the previous EKG. EKG also reviewed by Dr. Willy EddyPatrick Robinson. ____________________________________________  RADIOLOGY I have personally viewed and evaluated these images (plain radiographs) as part of my medical decision making, as well as reviewing the written report by the radiologist.  Dg Chest 2 View  Result Date: 02/29/2016 CLINICAL DATA:  Coughing and back pain started last night. EXAM: CHEST  2 VIEW COMPARISON:  06/16/2015 FINDINGS: The lungs are clear wiithout focal pneumonia, edema, pneumothorax or pleural effusion. The cardiopericardial silhouette is within normal limits for size. The visualized bony structures of the thorax are intact. IMPRESSION: No active cardiopulmonary disease. Electronically Signed   By: Kennith CenterEric  Mansell M.D.   On: 02/29/2016 16:26    ____________________________________________    PROCEDURES  Procedure(s) performed: None   Procedures   Medications  ipratropium-albuterol (DUONEB) 0.5-2.5 (3) MG/3ML nebulizer solution 3 mL (3 mLs Nebulization Given  02/29/16 1704)   ----------------------------------------- 5:21 PM on 02/29/2016 -----------------------------------------  Respiratory exam after DuoNeb was significantly improved with clear air sounds noted throughout bilateral lung fields. No wheeze, rhonchi or rales. Patient was eating and drinking without difficulty or distress.Patient does note onset of runny nose and nasal congestion since DuoNeb was given.  ____________________________________________   INITIAL IMPRESSION / ASSESSMENT AND PLAN / ED COURSE  Pertinent labs & imaging results that were available during my care of the patient were reviewed by me and considered in my medical decision making (see chart for details).  Clinical Course  Comment By Time  Imaging results were discussed with the patient. DuoNeb nebulized treatment was ordered. Patient requests graham crackers, peanut butter and a soda. I told the patient we will bring her food and beverages as requested once her nebulized treatment is completed. Hope PigeonJami L Hagler, PA-C 09/15 1702    Patient's diagnosis is consistent with Asthma exacerbation in the setting of viral URI with cough. Patient will be discharged home with prescriptions for loratadine and promethazine DM to take as directed. Patient was also given a prescription for DuoNeb nebulized solution that she may use every 6 hours as needed. Patient is to follow up with her primary care provider if symptoms persist past this treatment course. Patient is given ED precautions to return to the ED for any worsening or new symptoms.    ____________________________________________  FINAL CLINICAL IMPRESSION(S) / ED DIAGNOSES  Final diagnoses:  Asthma exacerbation  Viral URI with cough      NEW MEDICATIONS STARTED DURING THIS VISIT:  Discharge Medication List as of 02/29/2016  5:24 PM    START taking these medications   Details  loratadine (CLARITIN) 10 MG tablet Take 1 tablet (10 mg total) by mouth daily.,  Starting Fri 02/29/2016, Print    promethazine-dextromethorphan (PROMETHAZINE-DM) 6.25-15 MG/5ML syrup Take 5 mLs by mouth 4 (four) times daily as needed for cough., Starting Fri 02/29/2016, Print             Ernestene KielJami L EdgewaterHagler, PA-C 02/29/16 1759    Governor Rooksebecca Lord, MD 02/29/16 2046

## 2016-07-28 ENCOUNTER — Other Ambulatory Visit: Payer: Self-pay | Admitting: Family Medicine

## 2016-07-28 DIAGNOSIS — N644 Mastodynia: Secondary | ICD-10-CM

## 2016-08-11 ENCOUNTER — Other Ambulatory Visit: Payer: Self-pay

## 2016-08-11 ENCOUNTER — Ambulatory Visit: Payer: Medicare HMO

## 2016-08-21 ENCOUNTER — Ambulatory Visit: Payer: Medicare HMO

## 2016-08-21 ENCOUNTER — Other Ambulatory Visit: Payer: Self-pay

## 2016-09-08 ENCOUNTER — Ambulatory Visit
Admission: RE | Admit: 2016-09-08 | Discharge: 2016-09-08 | Disposition: A | Discharge status: Home / Self Care | Payer: Medicare Other | Source: Ambulatory Visit | Attending: Family Medicine | Admitting: Family Medicine

## 2016-09-08 DIAGNOSIS — N644 Mastodynia: Secondary | ICD-10-CM

## 2016-09-08 DIAGNOSIS — N641 Fat necrosis of breast: Secondary | ICD-10-CM | POA: Insufficient documentation

## 2017-01-09 ENCOUNTER — Encounter: Payer: Self-pay | Admitting: Emergency Medicine

## 2017-01-09 DIAGNOSIS — J45909 Unspecified asthma, uncomplicated: Secondary | ICD-10-CM | POA: Insufficient documentation

## 2017-01-09 DIAGNOSIS — F17210 Nicotine dependence, cigarettes, uncomplicated: Secondary | ICD-10-CM | POA: Insufficient documentation

## 2017-01-09 DIAGNOSIS — R1012 Left upper quadrant pain: Secondary | ICD-10-CM | POA: Insufficient documentation

## 2017-01-09 DIAGNOSIS — R109 Unspecified abdominal pain: Secondary | ICD-10-CM | POA: Diagnosis present

## 2017-01-09 DIAGNOSIS — Z79899 Other long term (current) drug therapy: Secondary | ICD-10-CM | POA: Insufficient documentation

## 2017-01-09 LAB — CBC
HEMATOCRIT: 41 % (ref 35.0–47.0)
Hemoglobin: 14.1 g/dL (ref 12.0–16.0)
MCH: 31.2 pg (ref 26.0–34.0)
MCHC: 34.4 g/dL (ref 32.0–36.0)
MCV: 90.8 fL (ref 80.0–100.0)
Platelets: 275 10*3/uL (ref 150–440)
RBC: 4.52 MIL/uL (ref 3.80–5.20)
RDW: 12.7 % (ref 11.5–14.5)
WBC: 8.2 10*3/uL (ref 3.6–11.0)

## 2017-01-09 LAB — COMPREHENSIVE METABOLIC PANEL
ALBUMIN: 4.3 g/dL (ref 3.5–5.0)
ALT: 21 U/L (ref 14–54)
ANION GAP: 9 (ref 5–15)
AST: 25 U/L (ref 15–41)
Alkaline Phosphatase: 48 U/L (ref 38–126)
BILIRUBIN TOTAL: 0.6 mg/dL (ref 0.3–1.2)
BUN: 17 mg/dL (ref 6–20)
CHLORIDE: 107 mmol/L (ref 101–111)
CO2: 23 mmol/L (ref 22–32)
Calcium: 9.7 mg/dL (ref 8.9–10.3)
Creatinine, Ser: 0.89 mg/dL (ref 0.44–1.00)
GFR calc Af Amer: >60 mL/min (ref >60)
GFR calc non Af Amer: >60 mL/min (ref >60)
GLUCOSE: 99 mg/dL (ref 65–99)
Potassium: 4.1 mmol/L (ref 3.5–5.1)
SODIUM: 139 mmol/L (ref 135–145)
TOTAL PROTEIN: 7.6 g/dL (ref 6.5–8.1)

## 2017-01-09 LAB — URINALYSIS, COMPLETE (UACMP) WITH MICROSCOPIC
BACTERIA UA: NONE SEEN
BILIRUBIN URINE: NEGATIVE
Glucose, UA: NEGATIVE mg/dL
HGB URINE DIPSTICK: NEGATIVE
Ketones, ur: NEGATIVE mg/dL
LEUKOCYTES UA: NEGATIVE
NITRITE: NEGATIVE
PROTEIN: NEGATIVE mg/dL
Specific Gravity, Urine: 1.015 (ref 1.005–1.030)
pH: 5 (ref 5.0–8.0)

## 2017-01-09 LAB — LIPASE, BLOOD: LIPASE: 45 U/L (ref 11–51)

## 2017-01-09 MED ORDER — OXYCODONE-ACETAMINOPHEN 5-325 MG PO TABS
1.0000 | ORAL_TABLET | Freq: Once | ORAL | Status: AC
  Administered 2017-01-09: 1 via ORAL
  Filled 2017-01-09: qty 1

## 2017-01-09 NOTE — ED Triage Notes (Signed)
C/O LUQ/ left flank, left back pain x 3 days.

## 2017-01-10 ENCOUNTER — Emergency Department: Payer: Medicare Other

## 2017-01-10 ENCOUNTER — Emergency Department
Admission: EM | Admit: 2017-01-10 | Discharge: 2017-01-10 | Disposition: A | Discharge status: Home / Self Care | Payer: Medicare Other | Attending: Emergency Medicine | Admitting: Emergency Medicine

## 2017-01-10 ENCOUNTER — Encounter: Payer: Self-pay | Admitting: Radiology

## 2017-01-10 DIAGNOSIS — R1012 Left upper quadrant pain: Secondary | ICD-10-CM

## 2017-01-10 MED ORDER — OXYCODONE-ACETAMINOPHEN 5-325 MG PO TABS: 1.0000 | ORAL_TABLET | ORAL | 0 refills | Status: DC | PRN

## 2017-01-10 MED ORDER — OXYCODONE-ACETAMINOPHEN 5-325 MG PO TABS: 1.0000 | ORAL_TABLET | Freq: Once | ORAL | Status: DC

## 2017-01-10 MED ORDER — MORPHINE SULFATE (PF) 4 MG/ML IV SOLN
4.0000 mg | Freq: Once | INTRAVENOUS | Status: AC
  Administered 2017-01-10: 4 mg via INTRAVENOUS
  Filled 2017-01-10: qty 1

## 2017-01-10 MED ORDER — KETOROLAC TROMETHAMINE 30 MG/ML IJ SOLN
30.0000 mg | Freq: Once | INTRAMUSCULAR | Status: AC
  Administered 2017-01-10: 30 mg via INTRAVENOUS
  Filled 2017-01-10: qty 1

## 2017-01-10 MED ORDER — IOPAMIDOL (ISOVUE-300) INJECTION 61%
100.0000 mL | Freq: Once | INTRAVENOUS | Status: AC | PRN
  Administered 2017-01-10: 100 mL via INTRAVENOUS

## 2017-01-10 MED ORDER — MORPHINE SULFATE (PF) 4 MG/ML IV SOLN
4.0000 mg | Freq: Once | INTRAVENOUS | Status: DC
  Filled 2017-01-10: qty 1

## 2017-01-10 MED ORDER — ONDANSETRON HCL 4 MG/2ML IJ SOLN
4.0000 mg | Freq: Once | INTRAMUSCULAR | Status: AC
  Administered 2017-01-10: 4 mg via INTRAVENOUS
  Filled 2017-01-10: qty 2

## 2017-01-10 MED ORDER — ONDANSETRON 4 MG PO TBDP: 4.0000 mg | ORAL_TABLET | Freq: Three times a day (TID) | ORAL | 0 refills | Status: DC | PRN

## 2017-01-10 NOTE — ED Notes (Signed)

## 2017-01-10 NOTE — ED Provider Notes (Signed)
[facility] Emergency Department Provider Note    First MD Initiated Contact with Patient 01/10/17 [phone]-[phone]     (approximate)  I have reviewed the triage vital signs and the nursing notes.   HISTORY  Chief Complaint Abdominal Pain    HPI Kendra Clark is a 41 y.o. female with bolus of chronic medical conditions presents to the emergency department with 10 out of 10 sharp left upper quadrant abdominal pain 3 days. Patient denies any vomiting but does admit to nausea no fever. Patient denies any urinary symptoms.   Past Medical History:  Diagnosis Date  . Asthma   . Atopy   . Degenerative disc disease, lumbar   . Depression   . Hiatal hernia   . History of mitral valve disease   . Hyperlipidemia   . IBS (irritable bowel syndrome)   . Osteoporosis     Patient Active Problem List   Diagnosis Date Noted  . Coronary artery calcification 03/26/2015  . Morbid obesity (HCC) 03/26/2015    Past Surgical History:  Procedure Laterality Date  . ABDOMINAL HYSTERECTOMY    . CESAREAN SECTION     x 3     Prior to Admission medications   Medication Sig Start Date End Date Taking? Authorizing Provider  AMOXICILLIN PO Take by mouth.    [provider]  azithromycin (ZITHROMAX Z-PAK) 250 MG tablet Take 2 tablets (500 mg) on  Day 1,  followed by 1 tablet (250 mg) once daily on Days 2 through 5. 06/16/15   Tommi RumpsSummers, [name], PA-C  chlorpheniramine-HYDROcodone (TUSSIONEX PENNKINETIC ER) 10-8 MG/5ML SUER Take 5 mLs by mouth 2 (two) times daily. 06/16/15   Tommi RumpsSummers, [name], PA-C  clindamycin (CLEOCIN) 300 MG capsule Take 300 mg by mouth 2 (two) times daily.  03/21/15   [provider]  fluconazole (DIFLUCAN) 150 MG tablet Take 150 mg by mouth once.  04/06/10   [provider]  ibuprofen (ADVIL,MOTRIN) 600 MG tablet Take by mouth every 6 (six) hours as needed.  03/21/15   [provider]  ipratropium-albuterol (DUONEB) 0.5-2.5 (3)  MG/3ML SOLN Take 3 mLs by nebulization every 6 (six) hours as needed. 02/29/16   Hagler, Jami L, PA-C  loratadine (CLARITIN) 10 MG tablet Take 1 tablet (10 mg total) by mouth daily. 02/29/16   Hagler, Jami L, PA-C  lubiprostone (AMITIZA) 24 MCG capsule Take 24 mcg by mouth 2 (two) times daily with a meal.  06/25/10   [provider]  methylPREDNISolone (MEDROL DOSEPAK) 4 MG TBPK tablet Take Tapered dose as directed 05/20/15   [name], [name], PA-C  ondansetron (ZOFRAN ODT) 4 MG disintegrating tablet Take 1 tablet (4 mg total) by mouth every 8 (eight) hours as needed for nausea or vomiting. 01/10/17   [name], [place] N, MD  orphenadrine (NORFLEX) 100 MG tablet Take 1 tablet (100 mg total) by mouth 2 (two) times daily. 05/20/15   [name], [name], PA-C  oxyCODONE-acetaminophen (PERCOCET) 7.5-325 MG tablet Take 1 tablet by mouth every 4 (four) hours as needed for severe pain. 05/20/15   [name], [name], PA-C  oxyCODONE-acetaminophen (ROXICET) 5-325 MG tablet Take 1 tablet by mouth every 4 (four) hours as needed for severe pain. 01/10/17   [name],  N, MD  predniSONE (DELTASONE) 10 MG tablet Take 5 tablets  tomorrow, on day 2 take 4 tablets, day 3 take 3 tablets, day 4 take 2 tablets, day 5 take  1 tablets 06/16/15   Bridget HartshornSummers, Rhonda  L, PA-C  promethazine-dextromethorphan (PROMETHAZINE-DM) 6.25-15 MG/5ML syrup Take 5 mLs by mouth 4 (four) times daily as needed for cough. 02/29/16   Hagler, Jami L, PA-C    Allergies Penicillins and Tramadol  Family History  Problem Relation Age of Onset  . Hypertension Father   . Hyperlipidemia Father   . Hypertension Mother   . Hyperlipidemia Mother   . Arrhythmia Mother        A-fib  . Breast cancer Mother 66  . Breast cancer Paternal Aunt 60  . Breast cancer Paternal Grandmother 70    Social History Social History  Substance Use Topics  . Smoking status: Current Every Day Smoker    Packs/day: 1.50    Years: 13.00    Types: Cigarettes  . Smokeless  tobacco: Never Used  . Alcohol use No    Review of Systems Constitutional: No fever/chills Eyes: No visual changes. ENT: No sore throat. Cardiovascular: Denies chest pain. Respiratory: Denies shortness of breath. Gastrointestinal:Positive for left upper quadrant pain abdominal pain.  No nausea, no vomiting.  No diarrhea.  No constipation. Genitourinary: Negative for dysuria. Musculoskeletal: Negative for neck pain.  Negative for back pain. Integumentary: Negative for rash. Neurological: Negative for headaches, focal weakness or numbness.  ____________________________________________   PHYSICAL EXAM:  VITAL SIGNS: ED Triage Vitals  Enc Vitals Group     BP 01/09/17 2233 135/68     Pulse Rate 01/09/17 2233 74     Resp 01/09/17 2233 18     Temp 01/09/17 2233 97.8 F (36.6 C)     Temp Source 01/09/17 2233 Oral     SpO2 01/09/17 2233 97 %     Weight 01/09/17 2232 (!) 163.3 kg (360 lb)     Height 01/09/17 2232 1.651 m (5\' 5" )     Head Circumference --      Peak Flow --      Pain Score 01/09/17 2231 10     Pain Loc --      Pain Edu? --      Excl. in GC? --     Constitutional: Alert and oriented. Apparent discomfort Eyes: Conjunctivae are normal.  Head: Atraumatic. Mouth/Throat: Mucous membranes are moist. Neck: No stridor.   Cardiovascular: Normal rate, regular rhythm. Good peripheral circulation. Grossly normal heart sounds. Respiratory: Normal respiratory effort.  No retractions. Lungs CTAB. Gastrointestinal: Left lower quadrant tenderness to palpation.. No distention.  Musculoskeletal: No lower extremity tenderness nor edema. No gross deformities of extremities. Neurologic:  Normal speech and language. No gross focal neurologic deficits are appreciated.  Skin:  Skin is warm, dry and intact. No rash noted. Psychiatric: Mood and affect are normal. Speech and behavior are normal.  ____________________________________________   LABS (all labs ordered are listed, but  only abnormal results are displayed)  Labs Reviewed  URINALYSIS, COMPLETE (UACMP) WITH MICROSCOPIC - Abnormal; Notable for the following:       Result Value   Color, Urine YELLOW (*)    APPearance CLEAR (*)    Squamous Epithelial / LPF 0-5 (*)    All other components within normal limits  LIPASE, BLOOD  COMPREHENSIVE METABOLIC PANEL  CBC     RADIOLOGY I, Fabens N Genowefa Morga, personally viewed and evaluated these images (plain radiographs) as part of my medical decision making, as well as reviewing the written report by the radiologist.  Ct Abdomen Pelvis W Contrast  Result Date: 01/10/2017 CLINICAL DATA:  Left upper quadrant pain and left flank pain for 3 days. EXAM: CT  ABDOMEN AND PELVIS WITH CONTRAST TECHNIQUE: Multidetector CT imaging of the abdomen and pelvis was performed using the standard protocol following bolus administration of intravenous contrast. CONTRAST:  100mL ISOVUE-300 IOPAMIDOL (ISOVUE-300) INJECTION 61% COMPARISON:  11/27/2012 FINDINGS: Lower chest: No acute abnormality. Hepatobiliary: No focal liver abnormality is seen. Status post cholecystectomy. No biliary dilatation. Pancreas: Unremarkable. No pancreatic ductal dilatation or surrounding inflammatory changes. Spleen: Normal in size without focal abnormality. Adrenals/Urinary Tract: Adrenal glands are unremarkable. 2.4 cm water density lesion of the left renal midpole, probably a benign cyst. No suspicious renal parenchymal lesions. Kidneys are otherwise normal, without renal calculi or hydronephrosis. Bladder is unremarkable. Stomach/Bowel: Stomach is within normal limits. Appendix is normal. No evidence of bowel wall thickening, distention, or inflammatory changes. Vascular/Lymphatic: No significant vascular findings are present. No enlarged abdominal or pelvic lymph nodes. Reproductive: Status post hysterectomy. No adnexal masses. Other: No focal inflammation. No ascites. Small fat containing umbilical hernia.  Musculoskeletal: No significant skeletal lesion. IMPRESSION: 1. 2.4 cm low-attenuation left renal lesion, probably a benign cyst. 2. No acute findings in the abdomen or pelvis. 3. Fat containing umbilical hernia. Electronically Signed   By: Ellery Plunkaniel R Mitchell M.D.   On: 01/10/2017 02:53     Procedures   ____________________________________________   INITIAL IMPRESSION / ASSESSMENT AND PLAN / ED COURSE  Pertinent labs & imaging results that were available during my care of the patient were reviewed by me and considered in my medical decision making (see chart for details).  Patient received 2 doses of IV morphine in the emergency department 444 mg each 41 year old female presenting to the emergency part of left upper quadrant abdominal pain. Laboratory data unremarkable CT scan of the abdomen and pelvis likewise no acute findings. Unclear etiology for the patient's left upper quadrant abdominal pain review chart which noted [facility] with no acute findings there as well. WIll prescribe Percocet and Zofran for the patient at home with referral to gastroenterology. Patient understandably upset regarding the fact that no diagnosis was found spoke with the patient regarding this fact at length and apologized however I informed patient that it is not possible to make every diagnosis in the emergency department.      ____________________________________________  FINAL CLINICAL IMPRESSION(S) / ED DIAGNOSES  Final diagnoses:  Left upper quadrant pain     MEDICATIONS GIVEN DURING THIS VISIT:  Medications  oxyCODONE-acetaminophen (PERCOCET/ROXICET) 5-325 MG per tablet 1 tablet (not administered)  morphine 4 MG/ML injection 4 mg (not administered)  oxyCODONE-acetaminophen (PERCOCET/ROXICET) 5-325 MG per tablet 1 tablet (1 tablet Oral Given 01/09/17 2255)  morphine 4 MG/ML injection 4 mg (4 mg Intravenous Given 01/10/17 0218)  ondansetron (ZOFRAN) injection 4 mg  (4 mg Intravenous Given 01/10/17 0218)  iopamidol (ISOVUE-300) 61 % injection 100 mL (100 mLs Intravenous Contrast Given 01/10/17 0236)     NEW OUTPATIENT MEDICATIONS STARTED DURING THIS VISIT:  New Prescriptions   ONDANSETRON (ZOFRAN ODT) 4 MG DISINTEGRATING TABLET    Take 1 tablet (4 mg total) by mouth every 8 (eight) hours as needed for nausea or vomiting.   OXYCODONE-ACETAMINOPHEN (ROXICET) 5-325 MG TABLET    Take 1 tablet by mouth every 4 (four) hours as needed for severe pain.    Modified Medications   No medications on file    Discontinued Medications   No medications on file     Note:  This document was prepared using Dragon voice recognition software and may include unintentional dictation errors.  Darci Current, MD 01/10/17 [phone]

## 2017-01-10 NOTE — ED Notes (Signed)
E-signature pad is not working in this room, unable to obtain signature from the patient for discharge.

## 2017-02-02 ENCOUNTER — Ambulatory Visit
Admission: RE | Admit: 2017-02-02 | Discharge: 2017-02-02 | Disposition: A | Discharge status: Home / Self Care | Payer: Disability Insurance | Source: Ambulatory Visit | Attending: Pediatrics | Admitting: Pediatrics

## 2017-02-02 ENCOUNTER — Other Ambulatory Visit: Payer: Self-pay | Admitting: Pediatrics

## 2017-02-02 DIAGNOSIS — M545 Low back pain: Secondary | ICD-10-CM

## 2017-02-02 DIAGNOSIS — R29898 Other symptoms and signs involving the musculoskeletal system: Secondary | ICD-10-CM

## 2017-02-02 DIAGNOSIS — F39 Unspecified mood [affective] disorder: Secondary | ICD-10-CM | POA: Diagnosis not present

## 2017-02-02 DIAGNOSIS — M549 Dorsalgia, unspecified: Secondary | ICD-10-CM | POA: Diagnosis not present

## 2017-02-13 ENCOUNTER — Ambulatory Visit
Admission: RE | Admit: 2017-02-13 | Discharge: 2017-02-13 | Attending: Emergency Medicine | Admitting: Emergency Medicine

## 2017-02-13 DIAGNOSIS — M722 Plantar fascial fibromatosis: Principal | ICD-10-CM

## 2017-02-19 ENCOUNTER — Ambulatory Visit: Admission: RE | Admit: 2017-02-19 | Discharge: 2017-02-19 | Attending: Emergency Medicine

## 2017-02-19 DIAGNOSIS — M722 Plantar fascial fibromatosis: Principal | ICD-10-CM

## 2017-02-19 MED ORDER — OXYCODONE-ACETAMINOPHEN 5 MG-325 MG TABLET: tablet | Freq: Four times a day (QID) | 0 refills | 0 days | Status: AC

## 2017-02-19 MED ORDER — OXYCODONE-ACETAMINOPHEN 5 MG-325 MG TABLET
ORAL_TABLET | Freq: Four times a day (QID) | ORAL | 0 refills | 0.00000 days | Status: CP | PRN
Start: 2017-02-19 — End: 2018-04-27

## 2017-03-06 ENCOUNTER — Ambulatory Visit: Admission: RE | Admit: 2017-03-06 | Discharge: 2017-03-06

## 2017-03-06 DIAGNOSIS — M722 Plantar fascial fibromatosis: Principal | ICD-10-CM

## 2017-06-05 ENCOUNTER — Ambulatory Visit: Admission: RE | Admit: 2017-06-05 | Discharge: 2017-06-05 | Admitting: Emergency Medicine

## 2017-06-05 DIAGNOSIS — M79672 Pain in left foot: Secondary | ICD-10-CM

## 2017-06-05 DIAGNOSIS — M722 Plantar fascial fibromatosis: Principal | ICD-10-CM

## 2017-06-05 DIAGNOSIS — M79671 Pain in right foot: Secondary | ICD-10-CM

## 2017-06-05 DIAGNOSIS — M5136 Other intervertebral disc degeneration, lumbar region: Secondary | ICD-10-CM

## 2017-06-05 MED ORDER — METHYLPREDNISOLONE 4 MG TABLETS IN A DOSE PACK
PACK | 0 refills | 0 days | Status: CP
Start: 2017-06-05 — End: 2018-04-27

## 2017-06-10 ENCOUNTER — Other Ambulatory Visit: Payer: Self-pay

## 2017-06-10 ENCOUNTER — Emergency Department: Payer: Medicare Other

## 2017-06-10 ENCOUNTER — Emergency Department
Admission: EM | Admit: 2017-06-10 | Discharge: 2017-06-10 | Disposition: A | Discharge status: Home / Self Care | Payer: Medicare Other | Attending: Emergency Medicine | Admitting: Emergency Medicine

## 2017-06-10 DIAGNOSIS — J45901 Unspecified asthma with (acute) exacerbation: Secondary | ICD-10-CM | POA: Diagnosis not present

## 2017-06-10 DIAGNOSIS — R079 Chest pain, unspecified: Secondary | ICD-10-CM | POA: Diagnosis present

## 2017-06-10 DIAGNOSIS — J4521 Mild intermittent asthma with (acute) exacerbation: Secondary | ICD-10-CM

## 2017-06-10 DIAGNOSIS — J069 Acute upper respiratory infection, unspecified: Secondary | ICD-10-CM

## 2017-06-10 DIAGNOSIS — F17210 Nicotine dependence, cigarettes, uncomplicated: Secondary | ICD-10-CM | POA: Diagnosis not present

## 2017-06-10 LAB — BASIC METABOLIC PANEL
ANION GAP: 5 (ref 5–15)
BUN: 15 mg/dL (ref 6–20)
CALCIUM: 9 mg/dL (ref 8.9–10.3)
CO2: 24 mmol/L (ref 22–32)
Chloride: 110 mmol/L (ref 101–111)
Creatinine, Ser: 0.91 mg/dL (ref 0.44–1.00)
Glucose, Bld: 90 mg/dL (ref 65–99)
POTASSIUM: 4.2 mmol/L (ref 3.5–5.1)
Sodium: 139 mmol/L (ref 135–145)

## 2017-06-10 LAB — CBC
HEMATOCRIT: 41.7 % (ref 35.0–47.0)
HEMOGLOBIN: 14.3 g/dL (ref 12.0–16.0)
MCH: 31.4 pg (ref 26.0–34.0)
MCHC: 34.4 g/dL (ref 32.0–36.0)
MCV: 91.2 fL (ref 80.0–100.0)
Platelets: 288 10*3/uL (ref 150–440)
RBC: 4.57 MIL/uL (ref 3.80–5.20)
RDW: 12.8 % (ref 11.5–14.5)
WBC: 10.1 10*3/uL (ref 3.6–11.0)

## 2017-06-10 LAB — INFLUENZA PANEL BY PCR (TYPE A & B)
INFLAPCR: NEGATIVE
Influenza B By PCR: NEGATIVE

## 2017-06-10 LAB — TROPONIN I

## 2017-06-10 MED ORDER — IPRATROPIUM-ALBUTEROL 0.5-2.5 (3) MG/3ML IN SOLN
3.0000 mL | Freq: Once | RESPIRATORY_TRACT | Status: AC
  Administered 2017-06-10: 3 mL via RESPIRATORY_TRACT
  Filled 2017-06-10: qty 3

## 2017-06-10 MED ORDER — PREDNISONE 20 MG PO TABS
60.0000 mg | ORAL_TABLET | Freq: Once | ORAL | Status: AC
  Administered 2017-06-10: 60 mg via ORAL
  Filled 2017-06-10: qty 3

## 2017-06-10 MED ORDER — HYDROCOD POLST-CPM POLST ER 10-8 MG/5ML PO SUER
5.0000 mL | Freq: Once | ORAL | Status: AC
  Administered 2017-06-10: 5 mL via ORAL
  Filled 2017-06-10: qty 5

## 2017-06-10 MED ORDER — HYDROCOD POLST-CPM POLST ER 10-8 MG/5ML PO SUER: 5.0000 mL | Freq: Two times a day (BID) | ORAL | 0 refills | Status: DC

## 2017-06-10 MED ORDER — PREDNISONE 10 MG (21) PO TBPK: ORAL_TABLET | Freq: Every day | ORAL | 0 refills | Status: DC

## 2017-06-10 NOTE — ED Triage Notes (Addendum)
Pt reports flu like sx X 1 week. Nasal drainage, sore throat, cough, fever, body aches. Pt wearing mask at this time.   Pt c/o left sided chest paint that radiates around left axilla and into left shoulder blade; sharp pain with coughing.   Pt took 1000mg  tylenol PTA.

## 2017-06-10 NOTE — ED Provider Notes (Signed)
[facility] Emergency Department Provider Note       Time seen: ----------------------------------------- 12:19 PM on 06/10/2017 -----------------------------------------   I have reviewed the triage vital signs and the nursing notes.  HISTORY   Chief Complaint Flu Like Symptoms and Chest Pain    HPI Kendra Clark is a 41 y.o. female with a history of asthma, depression, hyperlipidemia who presents to the ED for flulike symptoms for the last week.  She has had nasal drainage, sore throat, cough, fever and body aches.  She also has left-sided chest pain that is worse with coughing.  She received some Tylenol prior to arrival.  Past Medical History:  Diagnosis Date  . Asthma   . Atopy   . Degenerative disc disease, lumbar   . Depression   . Hiatal hernia   . History of mitral valve disease   . Hyperlipidemia   . IBS (irritable bowel syndrome)   . Osteoporosis     Patient Active Problem List   Diagnosis Date Noted  . Coronary artery calcification 03/26/2015  . Morbid obesity (HCC) 03/26/2015    Past Surgical History:  Procedure Laterality Date  . ABDOMINAL HYSTERECTOMY    . CESAREAN SECTION     x 3     Allergies Penicillins and Tramadol  Social History Social History   Tobacco Use  . Smoking status: Current Every Day Smoker    Packs/day: 1.50    Years: 13.00    Pack years: 19.50    Types: Cigarettes  . Smokeless tobacco: Never Used  Substance Use Topics  . Alcohol use: No  . Drug use: Not on file    Review of Systems Constitutional: Positive for fever Eyes: Negative for vision changes ENT: Positive for congestion Cardiovascular: Positive for chest pain Respiratory: Positive for shortness of breath and cough Gastrointestinal: Negative for abdominal pain, vomiting and diarrhea. Musculoskeletal: Negative for back pain. Skin: Negative for rash. Neurological: Negative for headaches, focal weakness or numbness.  All systems  negative/normal/unremarkable except as stated in the HPI  ____________________________________________   PHYSICAL EXAM:  VITAL SIGNS: ED Triage Vitals [06/10/17 1021]  Enc Vitals Group     BP 115/60     Pulse Rate 80     Resp 18     Temp 98.4 F (36.9 C)     Temp Source Oral     SpO2 100 %     Weight (!) 310 lb (140.6 kg)     Height 5\' 5"  (1.651 m)     Head Circumference      Peak Flow      Pain Score 9     Pain Loc      Pain Edu?      Excl. in GC?     Constitutional: Alert and oriented. Well appearing and in no distress. Eyes: Conjunctivae are normal. Normal extraocular movements. ENT   Head: Normocephalic and atraumatic.   Nose: No congestion/rhinnorhea.   Mouth/Throat: Mucous membranes are moist.   Neck: No stridor. Cardiovascular: Normal rate, regular rhythm. No murmurs, rubs, or gallops. Respiratory: Normal respiratory effort without tachypnea nor retractions. Breath sounds are clear and equal bilaterally. No wheezes/rales/rhonchi. Gastrointestinal: Soft and nontender. Normal bowel sounds Musculoskeletal: Nontender with normal range of motion in extremities. No lower extremity tenderness nor edema. Neurologic:  Normal speech and language. No gross focal neurologic deficits are appreciated.  Skin:  Skin is warm, dry and intact. No rash noted. Psychiatric: Mood and affect are normal. Speech and behavior  are normal.  ____________________________________________  EKG: Interpreted by me.  Sinus rhythm the rate is 64 bpm, septal infarct age-indeterminate, normal QRS, T wave inversions.  ____________________________________________  ED COURSE:  As part of my medical decision making, I reviewed the following data within the electronic MEDICAL RECORD NUMBER History obtained from family if available, nursing notes, old chart and ekg, as well as notes from prior ED visits. Patient presented for flulike symptoms, we will assess with labs and imaging as indicated at  this time. Clinical Course as of Jun 11 1415  Wed Jun 10, 2017  1222 EKG is unchanged from last year  [JW]    Clinical Course User Index [JW] [name], [name], MD   Procedures ____________________________________________   LABS (pertinent positives/negatives)  Labs Reviewed  BASIC METABOLIC PANEL  CBC  TROPONIN I  INFLUENZA PANEL BY PCR (TYPE A & B)    RADIOLOGY  Chest x-ray is normal  ____________________________________________  DIFFERENTIAL DIAGNOSIS   URI, bronchitis, pneumonia, PE, pneumothorax  FINAL ASSESSMENT AND PLAN  URI, asthma exacerbation   Plan: Patient had presented for flulike symptoms. Patient's labs were reassuring. Patient's imaging did not reveal any acute process.  She is currently feeling and breathing better.  There are no wheezes on auscultation.  I will advise her to continue using her nebulizer at home.  She will be prescribed steroids and cough medication.  Overall she is stable for outpatient follow-up.  This appears to be viral.   [name], [name], MD   Note: This note was generated in part or whole with voice recognition software. Voice recognition is usually quite accurate but there are transcription errors that can and very often do occur. I apologize for any typographical errors that were not detected and corrected.     [name], [name], MD 06/10/17 [phone]

## 2017-06-10 NOTE — ED Notes (Signed)
[facility]  Taken to room 6.  Dajea RN aware.

## 2017-08-11 ENCOUNTER — Encounter: Admit: 2017-08-11 | Discharge: 2017-08-12 | Payer: MEDICARE

## 2017-08-11 DIAGNOSIS — M79671 Pain in right foot: Principal | ICD-10-CM

## 2017-08-19 ENCOUNTER — Other Ambulatory Visit: Payer: Self-pay

## 2017-08-19 ENCOUNTER — Encounter: Payer: Self-pay | Admitting: Emergency Medicine

## 2017-08-19 ENCOUNTER — Emergency Department
Admission: EM | Admit: 2017-08-19 | Discharge: 2017-08-20 | Disposition: A | Discharge status: Home / Self Care | Payer: Medicare Other | Attending: Emergency Medicine | Admitting: Emergency Medicine

## 2017-08-19 DIAGNOSIS — R05 Cough: Secondary | ICD-10-CM | POA: Diagnosis present

## 2017-08-19 DIAGNOSIS — F17210 Nicotine dependence, cigarettes, uncomplicated: Secondary | ICD-10-CM | POA: Diagnosis not present

## 2017-08-19 DIAGNOSIS — J45909 Unspecified asthma, uncomplicated: Secondary | ICD-10-CM | POA: Insufficient documentation

## 2017-08-19 DIAGNOSIS — J209 Acute bronchitis, unspecified: Secondary | ICD-10-CM | POA: Diagnosis not present

## 2017-08-19 NOTE — ED Triage Notes (Signed)
Patient ambulatory to triage with steady gait, without difficulty or distress noted; pt reports recently dx with pneumonia on Tuesday; rx zpak & prednisone, cheratussin; pt st not feeling better; prod cough red/green sputum; c/o upper back & chest pain

## 2017-08-20 ENCOUNTER — Emergency Department: Payer: Medicare Other

## 2017-08-20 MED ORDER — BENZONATATE 100 MG PO CAPS: 100.0000 mg | ORAL_CAPSULE | Freq: Three times a day (TID) | ORAL | 0 refills | Status: AC | PRN

## 2017-08-20 MED ORDER — ALBUTEROL SULFATE (2.5 MG/3ML) 0.083% IN NEBU: 2.5000 mg | INHALATION_SOLUTION | Freq: Four times a day (QID) | RESPIRATORY_TRACT | 0 refills | Status: DC | PRN

## 2017-08-20 MED ORDER — ALBUTEROL SULFATE (2.5 MG/3ML) 0.083% IN NEBU
2.5000 mg | INHALATION_SOLUTION | Freq: Once | RESPIRATORY_TRACT | Status: AC
  Administered 2017-08-20: 2.5 mg via RESPIRATORY_TRACT
  Filled 2017-08-20: qty 3

## 2017-08-20 MED ORDER — ALBUTEROL SULFATE (2.5 MG/3ML) 0.083% IN NEBU
INHALATION_SOLUTION | RESPIRATORY_TRACT | Status: AC
  Filled 2017-08-20: qty 3

## 2017-08-20 MED ORDER — METHYLPREDNISOLONE SODIUM SUCC 125 MG IJ SOLR
125.0000 mg | Freq: Once | INTRAMUSCULAR | Status: AC
  Administered 2017-08-20: 125 mg via INTRAVENOUS
  Filled 2017-08-20: qty 2

## 2017-08-20 MED ORDER — ALBUTEROL SULFATE (2.5 MG/3ML) 0.083% IN NEBU
2.5000 mg | INHALATION_SOLUTION | Freq: Once | RESPIRATORY_TRACT | Status: AC
Start: 2017-08-20 — End: 2017-08-20
  Administered 2017-08-20: 2.5 mg via RESPIRATORY_TRACT

## 2017-08-20 MED ORDER — HYDROCOD POLST-CPM POLST ER 10-8 MG/5ML PO SUER
5.0000 mL | Freq: Once | ORAL | Status: AC
  Administered 2017-08-20: 5 mL via ORAL
  Filled 2017-08-20: qty 5

## 2017-08-20 NOTE — ED Provider Notes (Signed)
[facility] Emergency Department Provider Note    First MD Initiated Contact with Patient 08/20/17 0015     (approximate)  I have reviewed the triage vital signs and the nursing notes.   HISTORY  Chief Complaint Cough    HPI Kendra Clark is a 42 y.o. female [name] to the emergency department with a one-week history of productive cough with greenish yellow sputum.  Patient admits to chills and subjective fevers.  Patient states that cough is persistent resultant and upper chest and back discomfort.  Patient states that she was seen by her primary care provider on Tuesday diagnosed with pneumonia (no x-ray was performed).  Patient was prescribed a Z-Pak prednisone and Cheratussin however patient states that symptoms have persisted.   Past Medical History:  Diagnosis Date  . Asthma   . Atopy   . Degenerative disc disease, lumbar   . Depression   . Hiatal hernia   . History of mitral valve disease   . Hyperlipidemia   . IBS (irritable bowel syndrome)   . Osteoporosis     Patient Active Problem List   Diagnosis Date Noted  . Coronary artery calcification 03/26/2015  . Morbid obesity (HCC) 03/26/2015    Past Surgical History:  Procedure Laterality Date  . ABDOMINAL HYSTERECTOMY    . CESAREAN SECTION     x 3     Prior to Admission medications   Medication Sig Start Date End Date Taking? Authorizing Provider  azithromycin (ZITHROMAX Z-PAK) 250 MG tablet Take 2 tablets (500 mg) on  Day 1,  followed by 1 tablet (250 mg) once daily on Days 2 through 5. Patient not taking: Reported on 06/10/2017 06/16/15   Tommi RumpsSummers, [name], PA-C  chlorpheniramine-HYDROcodone [facility](TUSSIONEX PENNKINETIC ER) 10-8 MG/5ML SUER Take 5 mLs by mouth 2 (two) times daily. Patient not taking: Reported on 06/10/2017 06/16/15   Tommi RumpsSummers, [name], PA-C  chlorpheniramine-HYDROcodone [facility](TUSSIONEX PENNKINETIC ER) 10-8 MG/5ML SUER Take 5 mLs by mouth 2 (two) times daily. 06/10/17    [name], [name], MD  ibuprofen (ADVIL,MOTRIN) 600 MG tablet Take by mouth every 6 (six) hours as needed.  03/21/15   [provider]  ipratropium-albuterol (DUONEB) 0.5-2.5 (3) MG/3ML SOLN Take 3 mLs by nebulization every 6 (six) hours as needed. Patient not taking: Reported on 06/10/2017 02/29/16   Hagler, Jami L, PA-C  loratadine (CLARITIN) 10 MG tablet Take 1 tablet (10 mg total) by mouth daily. Patient not taking: Reported on 06/10/2017 02/29/16   Hagler, Jami L, PA-C  methylPREDNISolone (MEDROL DOSEPAK) 4 MG TBPK tablet Take Tapered dose as directed Patient not taking: Reported on 06/10/2017 05/20/15   [name], [name], PA-C  ondansetron (ZOFRAN ODT) 4 MG disintegrating tablet Take 1 tablet (4 mg total) by mouth every 8 (eight) hours as needed for nausea or vomiting. Patient not taking: Reported on 06/10/2017 01/10/17   [name], [name], MD  orphenadrine (NORFLEX) 100 MG tablet Take 1 tablet (100 mg total) by mouth 2 (two) times daily. Patient not taking: Reported on 06/10/2017 05/20/15   [name], [name], PA-C  oxyCODONE-acetaminophen (PERCOCET) 7.5-325 MG tablet Take 1 tablet by mouth every 4 (four) hours as needed for severe pain. Patient not taking: Reported on 06/10/2017 05/20/15   [name], [name], PA-C  oxyCODONE-acetaminophen (ROXICET) 5-325 MG tablet Take 1 tablet by mouth every 4 (four) hours as needed for severe pain. Patient not taking: Reported on 06/10/2017 01/10/17   [name], [name], MD  predniSONE (DELTASONE) 10  MG tablet Take 5 tablets  tomorrow, on day 2 take 4 tablets, day 3 take 3 tablets, day 4 take 2 tablets, day 5 take  1 tablets Patient not taking: Reported on 06/10/2017 06/16/15   Tommi Rumps, PA-C  predniSONE (STERAPRED UNI-PAK 21 TAB) 10 MG (21) TBPK tablet Take by mouth daily. Dispense steroid taper pack as directed 06/10/17   Emily Filbert, MD  promethazine-dextromethorphan (PROMETHAZINE-DM) 6.25-15 MG/5ML syrup Take 5 mLs by mouth 4 (four)  times daily as needed for cough. Patient not taking: Reported on 06/10/2017 02/29/16   Hagler, Jami L, PA-C    Allergies Penicillins and Tramadol  Family History  Problem Relation Age of Onset  . Hypertension Father   . Hyperlipidemia Father   . Hypertension Mother   . Hyperlipidemia Mother   . Arrhythmia Mother        A-fib  . Breast cancer Mother 39  . Breast cancer Paternal Aunt 64  . Breast cancer Paternal Grandmother 36    Social History Social History   Tobacco Use  . Smoking status: Current Every Day Smoker    Packs/day: 1.50    Years: 13.00    Pack years: 19.50    Types: Cigarettes  . Smokeless tobacco: Never Used  Substance Use Topics  . Alcohol use: No  . Drug use: Not on file    Review of Systems Constitutional: Positive for fever/chills Eyes: No visual changes. ENT: No sore throat. Cardiovascular: Denies chest pain. Respiratory: Denies shortness of breath.  Positive for productive cough Gastrointestinal: No abdominal pain.  No nausea, no vomiting.  No diarrhea.  No constipation. Genitourinary: Negative for dysuria. Musculoskeletal: Negative for neck pain.  Negative for back pain. Integumentary: Negative for rash. Neurological: Negative for headaches, focal weakness or numbness.   ____________________________________________   PHYSICAL EXAM:  VITAL SIGNS: ED Triage Vitals  Enc Vitals Group     BP 08/19/17 2352 101/65     Pulse Rate 08/19/17 2352 88     Resp 08/19/17 2352 18     Temp 08/19/17 2352 98.2 F (36.8 C)     Temp Source 08/19/17 2352 Oral     SpO2 08/19/17 2352 95 %     Weight 08/19/17 2350 (!) 139.3 kg (307 lb)     Height 08/19/17 2350 1.651 m (5\' 5" )     Head Circumference --      Peak Flow --      Pain Score 08/19/17 2350 10     Pain Loc --      Pain Edu? --      Excl. in GC? --     Constitutional: Alert and oriented.  Actively coughing eyes: Conjunctivae are normal.  Head: Atraumatic.Marland Kitchen Mouth/Throat: Mucous membranes are  moist. Oropharynx non-erythematous. Neck: No stridor.   Cardiovascular: Normal rate, regular rhythm. Good peripheral circulation. Grossly normal heart sounds. Respiratory: Normal respiratory effort.  No retractions. Lungs CTAB. Gastrointestinal: Soft and nontender. No distention.  Musculoskeletal: No lower extremity tenderness nor edema. No gross deformities of extremities. Neurologic:  Normal speech and language. No gross focal neurologic deficits are appreciated.  Skin:  Skin is warm, dry and intact. No rash noted. Psychiatric: Mood and affect are normal. Speech and behavior are normal.  ____________________________________________   LABS (all labs ordered are listed, but only abnormal results are displayed)  Labs Reviewed - No data to display ____________________________________________  EKG  ED ECG REPORT I, [place] N BROWN, the attending physician, personally viewed and interpreted this ECG.  Date: 08/20/2017  EKG Time: 12:01 AM  Rate: 90  Rhythm: Normal sinus rhythm  Axis: Normal  Intervals: Normal  ST&T Change: None  ____________________________________________  RADIOLOGY I, [place] N BROWN, personally viewed and evaluated these images (plain radiographs) as part of my medical decision making, as well as reviewing the written report by the radiologist  ED MD interpretation: No acute cardiopulmonary findings  Official radiology report(s): Dg Chest Portable 1 View  Result Date: 08/20/2017 CLINICAL DATA:  Cough.  Recent diagnosis of pneumonia. EXAM: PORTABLE CHEST 1 VIEW COMPARISON:  Radiographs 06/10/2017 FINDINGS: The cardiomediastinal contours are normal. Heart size normal for AP technique. Pulmonary vasculature is normal. No consolidation, pleural effusion, or pneumothorax. No acute osseous abnormalities are seen. IMPRESSION: No acute pulmonary process. Electronically Signed   By: Rubye Oaks M.D.   On: 08/20/2017 01:19     Procedures   ____________________________________________   INITIAL IMPRESSION / ASSESSMENT AND PLAN / ED COURSE  As part of my medical decision making, I reviewed the following data within the electronic MEDICAL RECORD NUMBER   42 year old female presented with above-stated history and physical exam with concern for possible influenza however patient states influenza versus pneumonia versus bronchitis.  influenza swab was performed that primary care provider which was negative.  Chest x-ray was performed which revealed no evidence of pneumonia per radiologist and as such suspect bronchitis as etiology for the patient's symptoms.  Patient received albuterol nebulized treatment x2 Solu-Medrol 125 mg and Tussionex with market improvement of symptoms while in the emergency department.    ____________________________________________  FINAL CLINICAL IMPRESSION(S) / ED DIAGNOSES  Final diagnoses:  Acute bronchitis, unspecified organism     MEDICATIONS GIVEN DURING THIS VISIT:  Medications  albuterol (PROVENTIL) (2.5 MG/3ML) 0.083% nebulizer solution 2.5 mg (2.5 mg Nebulization Given 08/20/17 0031)  chlorpheniramine-HYDROcodone (TUSSIONEX) 10-8 MG/5ML suspension 5 mL (5 mLs Oral Given 08/20/17 0040)  methylPREDNISolone sodium succinate (SOLU-MEDROL) 125 mg/2 mL injection 125 mg (125 mg Intravenous Given 08/20/17 0041)  albuterol (PROVENTIL) (2.5 MG/3ML) 0.083% nebulizer solution 2.5 mg (2.5 mg Nebulization Given 08/20/17 0207)     ED Discharge Orders    None       Note:  This document was prepared using Dragon voice recognition software and may include unintentional dictation errors.    Darci Current, MD 08/20/17 [phone]

## 2017-10-09 ENCOUNTER — Other Ambulatory Visit: Payer: Self-pay | Admitting: Family Medicine

## 2017-10-09 DIAGNOSIS — Z1231 Encounter for screening mammogram for malignant neoplasm of breast: Secondary | ICD-10-CM

## 2017-10-14 ENCOUNTER — Encounter
Admit: 2017-10-14 | Discharge: 2017-10-15 | Payer: MEDICARE | Attending: Emergency Medicine | Primary: Emergency Medicine

## 2017-10-14 DIAGNOSIS — M19072 Primary osteoarthritis, left ankle and foot: Secondary | ICD-10-CM

## 2017-10-14 DIAGNOSIS — M19071 Primary osteoarthritis, right ankle and foot: Secondary | ICD-10-CM

## 2017-10-14 DIAGNOSIS — M722 Plantar fascial fibromatosis: Principal | ICD-10-CM

## 2017-10-14 MED ORDER — DICLOFENAC POTASSIUM 50 MG TABLET
ORAL_TABLET | Freq: Two times a day (BID) | ORAL | 0 refills | 0 days | Status: CP
Start: 2017-10-14 — End: 2017-11-13

## 2017-11-11 MED ORDER — DICLOFENAC SODIUM 75 MG TABLET,DELAYED RELEASE
ORAL_TABLET | 0 refills | 0 days | Status: CP
Start: 2017-11-11 — End: 2018-02-10

## 2018-02-09 ENCOUNTER — Encounter: Payer: Self-pay | Admitting: Emergency Medicine

## 2018-02-09 ENCOUNTER — Other Ambulatory Visit: Payer: Self-pay

## 2018-02-09 ENCOUNTER — Emergency Department: Payer: Medicare HMO

## 2018-02-09 ENCOUNTER — Emergency Department
Admission: EM | Admit: 2018-02-09 | Discharge: 2018-02-09 | Disposition: A | Discharge status: Home / Self Care | Payer: Medicare HMO | Attending: Emergency Medicine | Admitting: Emergency Medicine

## 2018-02-09 DIAGNOSIS — R0602 Shortness of breath: Secondary | ICD-10-CM | POA: Insufficient documentation

## 2018-02-09 DIAGNOSIS — F17210 Nicotine dependence, cigarettes, uncomplicated: Secondary | ICD-10-CM | POA: Diagnosis not present

## 2018-02-09 DIAGNOSIS — R079 Chest pain, unspecified: Secondary | ICD-10-CM | POA: Diagnosis not present

## 2018-02-09 DIAGNOSIS — J45909 Unspecified asthma, uncomplicated: Secondary | ICD-10-CM | POA: Insufficient documentation

## 2018-02-09 LAB — COMPREHENSIVE METABOLIC PANEL
ALK PHOS: 48 U/L (ref 38–126)
ALT: 30 U/L (ref 0–44)
AST: 27 U/L (ref 15–41)
Albumin: 4.1 g/dL (ref 3.5–5.0)
Anion gap: 8 (ref 5–15)
BILIRUBIN TOTAL: 0.7 mg/dL (ref 0.3–1.2)
BUN: 14 mg/dL (ref 6–20)
CALCIUM: 9.1 mg/dL (ref 8.9–10.3)
CO2: 25 mmol/L (ref 22–32)
CREATININE: 1.17 mg/dL — AB (ref 0.44–1.00)
Chloride: 106 mmol/L (ref 98–111)
GFR, EST NON AFRICAN AMERICAN: 57 mL/min — AB (ref >60)
Glucose, Bld: 94 mg/dL (ref 70–99)
Potassium: 3.6 mmol/L (ref 3.5–5.1)
Sodium: 139 mmol/L (ref 135–145)
TOTAL PROTEIN: 7.5 g/dL (ref 6.5–8.1)

## 2018-02-09 LAB — CBC
HEMATOCRIT: 44.2 % (ref 35.0–47.0)
Hemoglobin: 15.1 g/dL (ref 12.0–16.0)
MCH: 31.8 pg (ref 26.0–34.0)
MCHC: 34.2 g/dL (ref 32.0–36.0)
MCV: 93 fL (ref 80.0–100.0)
Platelets: 309 10*3/uL (ref 150–440)
RBC: 4.75 MIL/uL (ref 3.80–5.20)
RDW: 12.9 % (ref 11.5–14.5)
WBC: 10.1 10*3/uL (ref 3.6–11.0)

## 2018-02-09 LAB — BRAIN NATRIURETIC PEPTIDE: B Natriuretic Peptide: 16 pg/mL (ref 0.0–100.0)

## 2018-02-09 LAB — TROPONIN I: Troponin I: <0.03 ng/mL (ref <0.03)

## 2018-02-09 MED ORDER — HYDROCODONE-ACETAMINOPHEN 5-325 MG PO TABS
1.0000 | ORAL_TABLET | Freq: Once | ORAL | Status: AC
  Administered 2018-02-09: 1 via ORAL
  Filled 2018-02-09: qty 1

## 2018-02-09 MED ORDER — KETOROLAC TROMETHAMINE 30 MG/ML IJ SOLN
15.0000 mg | Freq: Once | INTRAMUSCULAR | Status: AC
  Administered 2018-02-09: 15 mg via INTRAVENOUS
  Filled 2018-02-09: qty 1

## 2018-02-09 MED ORDER — IOPAMIDOL (ISOVUE-370) INJECTION 76%
75.0000 mL | Freq: Once | INTRAVENOUS | Status: AC | PRN
  Administered 2018-02-09: 75 mL via INTRAVENOUS

## 2018-02-09 MED ORDER — IBUPROFEN 600 MG PO TABS: 600.0000 mg | ORAL_TABLET | Freq: Three times a day (TID) | ORAL | 0 refills | Status: DC | PRN

## 2018-02-09 NOTE — ED Triage Notes (Signed)
Patient to ER from home via POV for c/o chest pain since day before yesterday. Patient states pain is stabbing in nature, has worsened since day before yesterday. Patient also c/o radiating pain to back of neck, back, shoulder blades, left arm. Also c/o pain to left calf. Denies any other area of leg hurting. Denies any injury to leg. Patient reports intermittent shortness of breath last 2 days. Denies any nausea. +"Cold sweats and dizziness". Denies any known fevers.

## 2018-02-09 NOTE — ED Provider Notes (Signed)
[facility] Emergency Department Provider Note  ____________________________________________   First MD Initiated Contact with Patient 02/09/18 1937     (approximate)  I have reviewed the triage vital signs and the nursing notes.   HISTORY  Chief Complaint Chest Pain   HPI Kendra Clark is a 41 y.o. female who self presents to the emergency department with 1 day of atypical chest pain.  Pain is in her left lateral chest is sharp and stabbing.  It is severe.  It is constant.  It radiates to the back of her neck on the left and her left shoulder.  Pain is sharp and stabbing and worse when taking a deep breath.  She has mild shortness of breath.  She reports pain to her left calf.  No history of DVT or pulmonary embolism.  She also is concerned because she has had "cold sweats" at night although has not measured any fever.  No history of coronary artery disease.  No abdominal pain.  Symptoms came on insidiously are now constant and nothing in particular seems to make them better.    Past Medical History:  Diagnosis Date  . Asthma   . Atopy   . Degenerative disc disease, lumbar   . Depression   . Hiatal hernia   . History of mitral valve disease   . Hyperlipidemia   . IBS (irritable bowel syndrome)   . Osteoporosis     Patient Active Problem List   Diagnosis Date Noted  . Coronary artery calcification 03/26/2015  . Morbid obesity (HCC) 03/26/2015    Past Surgical History:  Procedure Laterality Date  . ABDOMINAL HYSTERECTOMY    . CESAREAN SECTION     x 3     Prior to Admission medications   Medication Sig Start Date End Date Taking? Authorizing Provider  albuterol (PROVENTIL) (2.5 MG/3ML) 0.083% nebulizer solution Take 3 mLs (2.5 mg total) by nebulization every 6 (six) hours as needed for wheezing or shortness of breath. Patient not taking: Reported on 02/09/2018 08/20/17   Darci Current, MD  azithromycin (ZITHROMAX Z-PAK) 250 MG tablet Take  2 tablets (500 mg) on  Day 1,  followed by 1 tablet (250 mg) once daily on Days 2 through 5. Patient not taking: Reported on 06/10/2017 06/16/15   Tommi Rumps, PA-C  benzonatate (TESSALON PERLES) 100 MG capsule Take 1 capsule (100 mg total) by mouth 3 (three) times daily as needed for cough. Patient not taking: Reported on 02/09/2018 08/20/17 08/20/18  Darci Current, MD  chlorpheniramine-HYDROcodone [facility]) 10-8 MG/5ML SUER Take 5 mLs by mouth 2 (two) times daily. Patient not taking: Reported on 06/10/2017 06/16/15   Tommi Rumps, PA-C  chlorpheniramine-HYDROcodone [facility]) 10-8 MG/5ML SUER Take 5 mLs by mouth 2 (two) times daily. Patient not taking: Reported on 02/09/2018 06/10/17   Emily Filbert, MD  ibuprofen (ADVIL,MOTRIN) 600 MG tablet Take 1 tablet (600 mg total) by mouth every 8 (eight) hours as needed. 02/09/18   Merrily Brittle, MD  ipratropium-albuterol (DUONEB) 0.5-2.5 (3) MG/3ML SOLN Take 3 mLs by nebulization every 6 (six) hours as needed. Patient not taking: Reported on 06/10/2017 02/29/16   Hagler, Jami L, PA-C  loratadine (CLARITIN) 10 MG tablet Take 1 tablet (10 mg total) by mouth daily. Patient not taking: Reported on 06/10/2017 02/29/16   Hagler, Jami L, PA-C  methylPREDNISolone (MEDROL DOSEPAK) 4 MG TBPK tablet Take Tapered dose as directed Patient not taking: Reported on 06/10/2017  05/20/15   [name], PA-C  ondansetron (ZOFRAN ODT) 4 MG disintegrating tablet Take 1 tablet (4 mg total) by mouth every 8 (eight) hours as needed for nausea or vomiting. Patient not taking: Reported on 06/10/2017 01/10/17   Darci Current, MD  orphenadrine (NORFLEX) 100 MG tablet Take 1 tablet (100 mg total) by mouth 2 (two) times daily. Patient not taking: Reported on 06/10/2017 05/20/15   [name], PA-C  oxyCODONE-acetaminophen (PERCOCET) 7.5-325 MG tablet Take 1 tablet by mouth every 4 (four) hours as needed for severe  pain. Patient not taking: Reported on 06/10/2017 05/20/15   [name], PA-C  oxyCODONE-acetaminophen (ROXICET) 5-325 MG tablet Take 1 tablet by mouth every 4 (four) hours as needed for severe pain. Patient not taking: Reported on 06/10/2017 01/10/17   Darci Current, MD  predniSONE (DELTASONE) 10 MG tablet Take 5 tablets  tomorrow, on day 2 take 4 tablets, day 3 take 3 tablets, day 4 take 2 tablets, day 5 take  1 tablets Patient not taking: Reported on 06/10/2017 06/16/15   Tommi Rumps, PA-C  predniSONE (STERAPRED UNI-PAK 21 TAB) 10 MG (21) TBPK tablet Take by mouth daily. Dispense steroid taper pack as directed Patient not taking: Reported on 02/09/2018 06/10/17   Emily Filbert, MD  promethazine-dextromethorphan (PROMETHAZINE-DM) 6.25-15 MG/5ML syrup Take 5 mLs by mouth 4 (four) times daily as needed for cough. Patient not taking: Reported on 06/10/2017 02/29/16   Hagler, Jami L, PA-C    Allergies Penicillins and Tramadol  Family History  Problem Relation Age of Onset  . Hypertension Father   . Hyperlipidemia Father   . Hypertension Mother   . Hyperlipidemia Mother   . Arrhythmia Mother        A-fib  . Breast cancer Mother 24  . Breast cancer Paternal Aunt 55  . Breast cancer Paternal Grandmother 51    Social History Social History   Tobacco Use  . Smoking status: Current Every Day Smoker    Packs/day: 1.50    Years: 13.00    Pack years: 19.50    Types: Cigarettes  . Smokeless tobacco: Never Used  Substance Use Topics  . Alcohol use: No  . Drug use: Not on file    Review of Systems Constitutional: Positive for chills Eyes: No visual changes. ENT: No sore throat. Cardiovascular: Positive for chest pain. Respiratory: Positive for shortness of breath. Gastrointestinal: No abdominal pain.  No nausea, no vomiting.  No diarrhea.  No constipation. Genitourinary: Negative for dysuria. Musculoskeletal: Positive for leg pain Skin: Negative for  rash. Neurological: Negative for headaches, focal weakness or numbness.   ____________________________________________   PHYSICAL EXAM:  VITAL SIGNS: ED Triage Vitals  Enc Vitals Group     BP 02/09/18 1923 (!) 93/58     Pulse Rate 02/09/18 1923 69     Resp 02/09/18 1923 20     Temp 02/09/18 1923 98 F (36.7 C)     Temp Source 02/09/18 1923 Oral     SpO2 02/09/18 1923 96 %     Weight 02/09/18 1919 (!) 310 lb (140.6 kg)     Height 02/09/18 1919 5\' 5"  (1.651 m)     Head Circumference --      Peak Flow --      Pain Score 02/09/18 1919 8     Pain Loc --      Pain Edu? --      Excl. in GC? --  Constitutional: Alert and oriented x4 tearful and uncomfortable appearing Eyes: PERRL EOMI. Head: Atraumatic. Nose: No congestion/rhinnorhea. Mouth/Throat: No trismus Neck: No stridor.   Cardiovascular: Normal rate, regular rhythm. Grossly normal heart sounds.  Good peripheral circulation.  Exquisitely tender over left lateral chest although no crepitus Respiratory: Normal respiratory effort.  No retractions. Lungs CTAB and moving good air Gastrointestinal: Obese soft nontender Musculoskeletal: No lower extremity edema legs are equal in size Neurologic:  Normal speech and language. No gross focal neurologic deficits are appreciated. Skin:  Skin is warm, dry and intact. No rash noted.  Specifically no zoster rash Psychiatric: Mood and affect are normal. Speech and behavior are normal.    ____________________________________________   DIFFERENTIAL includes but not limited to  Zoster, pulmonary embolism, pneumothorax, acute coronary syndrome, aortic dissection ____________________________________________   LABS (all labs ordered are listed, but only abnormal results are displayed)  Labs Reviewed  COMPREHENSIVE METABOLIC PANEL - Abnormal; Notable for the following components:      Result Value   Creatinine, Ser 1.17 (*)    GFR calc non Af Amer 57 (*)    All other components  within normal limits  CBC  TROPONIN I  BRAIN NATRIURETIC PEPTIDE    Lab work reviewed by me with no signs of acute ischemia __________________________________________  EKG  ED ECG REPORT I, Merrily BrittleNeil Bethaney Oshana, the attending physician, personally viewed and interpreted this ECG.  Date: 02/09/2018 EKG Time:  Rate: 70 Rhythm: normal sinus rhythm QRS Axis: normal Intervals: normal ST/T Wave abnormalities: normal Narrative Interpretation: no evidence of acute ischemia  ____________________________________________  RADIOLOGY  CT angiogram reviewed by me with no acute disease ____________________________________________   PROCEDURES  Procedure(s) performed: no  Procedures  Critical Care performed: no  ____________________________________________   INITIAL IMPRESSION / ASSESSMENT AND PLAN / ED COURSE  Pertinent labs & imaging results that were available during my care of the patient were reviewed by me and considered in my medical decision making (see chart for details).   As part of my medical decision making, I reviewed the following data within the electronic MEDICAL RECORD NUMBER History obtained from family if available, nursing notes, old chart and ekg, as well as notes from prior ED visits.  The patient arrives with sharp constant pleuritic left chest pain and left leg pain.  Leg is not swollen however given the pleuritic nature and shortness of breath I obtained a CT angiogram which is fortunately negative for acute pathology.  Pain is significantly improved after Toradol.  Her son who is a IT sales professionalfirefighter is at bedside and understands and agrees with a short course of nonsteroidals and referral to cardiology.  The patient is discharged home in improved condition.      ____________________________________________   FINAL CLINICAL IMPRESSION(S) / ED DIAGNOSES  Final diagnoses:  Chest pain, unspecified type      NEW MEDICATIONS STARTED DURING THIS VISIT:  Discharge  Medication List as of 02/09/2018 10:03 PM    START taking these medications   Details  ibuprofen (ADVIL,MOTRIN) 600 MG tablet Take 1 tablet (600 mg total) by mouth every 8 (eight) hours as needed., Starting Tue 02/09/2018, Print         Note:  This document was prepared using Dragon voice recognition software and may include unintentional dictation errors.     Merrily Brittleifenbark, Lizzette Carbonell, MD 02/10/18 2233

## 2018-02-09 NOTE — Discharge Instructions (Signed)
Fortunately today your EKG, your CT scan, and your blood work were reassuring.  Please take your anti-inflammatory medication as prescribed and make an appointment to follow-up with the cardiologist this coming week for recheck.  Return to the emergency department sooner for any concerns.  It was a pleasure to take care of you today, and thank you for coming to our emergency department.  If you have any questions or concerns before leaving please ask the nurse to grab me and I'm more than happy to go through your aftercare instructions again.  If you were prescribed any opioid pain medication today such as Norco, Vicodin, Percocet, morphine, hydrocodone, or oxycodone please make sure you do not drive when you are taking this medication as it can alter your ability to drive safely.  If you have any concerns once you are home that you are not improving or are in fact getting worse before you can make it to your follow-up appointment, please do not hesitate to call 911 and come back for further evaluation.  Merrily BrittleNeil Jodie Leiner, MD  Results for orders placed or performed during the hospital encounter of 02/09/18  CBC  Result Value Ref Range   WBC 10.1 3.6 - 11.0 K/uL   RBC 4.75 3.80 - 5.20 MIL/uL   Hemoglobin 15.1 12.0 - 16.0 g/dL   HCT 96.044.2 45.435.0 - 09.847.0 %   MCV 93.0 80.0 - 100.0 fL   MCH 31.8 26.0 - 34.0 pg   MCHC 34.2 32.0 - 36.0 g/dL   RDW 11.912.9 14.711.5 - 82.914.5 %   Platelets 309 150 - 440 K/uL  Troponin I  Result Value Ref Range   Troponin I <0.03 <0.03 ng/mL  Brain natriuretic peptide  Result Value Ref Range   B Natriuretic Peptide 16.0 0.0 - 100.0 pg/mL  Comprehensive metabolic panel  Result Value Ref Range   Sodium 139 135 - 145 mmol/L   Potassium 3.6 3.5 - 5.1 mmol/L   Chloride 106 98 - 111 mmol/L   CO2 25 22 - 32 mmol/L   Glucose, Bld 94 70 - 99 mg/dL   BUN 14 6 - 20 mg/dL   Creatinine, Ser 5.621.17 (H) 0.44 - 1.00 mg/dL   Calcium 9.1 8.9 - 13.010.3 mg/dL   Total Protein 7.5 6.5 - 8.1 g/dL   Albumin 4.1 3.5 - 5.0 g/dL   AST 27 15 - 41 U/L   ALT 30 0 - 44 U/L   Alkaline Phosphatase 48 38 - 126 U/L   Total Bilirubin 0.7 0.3 - 1.2 mg/dL   GFR calc non Af Amer 57 (L) >60 mL/min   GFR calc Af Amer >60 >60 mL/min   Anion gap 8 5 - 15   Dg Chest 2 View  Result Date: 02/09/2018 CLINICAL DATA:  Chest pain for 2 days, initial encounter EXAM: CHEST - 2 VIEW COMPARISON:  08/19/2017 FINDINGS: The heart size and mediastinal contours are within normal limits. Both lungs are clear. The visualized skeletal structures are unremarkable. IMPRESSION: No active cardiopulmonary disease. Electronically Signed   By: Alcide CleverMark  Lukens M.D.   On: 02/09/2018 19:39   Ct Angio Chest Pe W/cm &/or Wo Cm  Result Date: 02/09/2018 CLINICAL DATA:  Chest pain radiating to the back. Intermittent shortness of breath. EXAM: CT ANGIOGRAPHY CHEST WITH CONTRAST TECHNIQUE: Multidetector CT imaging of the chest was performed using the standard protocol during bolus administration of intravenous contrast. Multiplanar CT image reconstructions and MIPs were obtained to evaluate the vascular anatomy. CONTRAST:  75mL ISOVUE-370  IOPAMIDOL (ISOVUE-370) INJECTION 76% COMPARISON:  CT chest dated November 27, 2012. FINDINGS: Cardiovascular: Satisfactory opacification of the pulmonary arteries to the segmental level. No evidence of pulmonary embolism. Normal heart size. No pericardial effusion. Normal caliber thoracic aorta. No aortic dissection. Mediastinum/Nodes: No enlarged mediastinal, hilar, or axillary lymph nodes. Thyroid gland, trachea, and esophagus demonstrate no significant findings. Lungs/Pleura: Lungs are clear. No pleural effusion or pneumothorax. No suspicious pulmonary nodule. Upper Abdomen: No acute abnormality. Musculoskeletal: No chest wall abnormality. No acute or significant osseous findings. Review of the MIP images confirms the above findings. IMPRESSION: 1. No evidence of pulmonary embolism. No acute intrathoracic process.  Electronically Signed   By: Obie Dredge M.D.   On: 02/09/2018 21:29

## 2018-02-10 MED ORDER — DICLOFENAC SODIUM 75 MG TABLET,DELAYED RELEASE
ORAL_TABLET | 0 refills | 0 days | Status: CP
Start: 2018-02-10 — End: 2018-04-03

## 2018-03-01 ENCOUNTER — Other Ambulatory Visit: Payer: Self-pay | Admitting: Family Medicine

## 2018-03-01 DIAGNOSIS — R195 Other fecal abnormalities: Secondary | ICD-10-CM

## 2018-03-01 DIAGNOSIS — R1012 Left upper quadrant pain: Secondary | ICD-10-CM

## 2018-03-02 ENCOUNTER — Ambulatory Visit: Payer: Medicare HMO

## 2018-03-02 ENCOUNTER — Ambulatory Visit
Admission: RE | Admit: 2018-03-02 | Discharge: 2018-03-02 | Disposition: A | Discharge status: Home / Self Care | Payer: Medicare HMO | Source: Ambulatory Visit | Attending: Family Medicine | Admitting: Family Medicine

## 2018-03-02 DIAGNOSIS — R1012 Left upper quadrant pain: Secondary | ICD-10-CM | POA: Diagnosis present

## 2018-03-02 DIAGNOSIS — R195 Other fecal abnormalities: Secondary | ICD-10-CM | POA: Insufficient documentation

## 2018-03-02 DIAGNOSIS — K760 Fatty (change of) liver, not elsewhere classified: Secondary | ICD-10-CM | POA: Insufficient documentation

## 2018-03-02 MED ORDER — IOPAMIDOL (ISOVUE-300) INJECTION 61%
125.0000 mL | Freq: Once | INTRAVENOUS | Status: AC | PRN
  Administered 2018-03-02: 125 mL via INTRAVENOUS

## 2018-03-09 ENCOUNTER — Emergency Department
Admission: EM | Admit: 2018-03-09 | Discharge: 2018-03-09 | Disposition: A | Discharge status: Home / Self Care | Payer: Medicare HMO | Attending: Emergency Medicine | Admitting: Emergency Medicine

## 2018-03-09 ENCOUNTER — Emergency Department: Payer: Medicare HMO

## 2018-03-09 ENCOUNTER — Encounter: Payer: Self-pay | Admitting: Medical Oncology

## 2018-03-09 DIAGNOSIS — R200 Anesthesia of skin: Secondary | ICD-10-CM | POA: Diagnosis not present

## 2018-03-09 DIAGNOSIS — M25511 Pain in right shoulder: Secondary | ICD-10-CM | POA: Diagnosis present

## 2018-03-09 DIAGNOSIS — Z79899 Other long term (current) drug therapy: Secondary | ICD-10-CM | POA: Diagnosis not present

## 2018-03-09 DIAGNOSIS — J45909 Unspecified asthma, uncomplicated: Secondary | ICD-10-CM | POA: Insufficient documentation

## 2018-03-09 DIAGNOSIS — F17210 Nicotine dependence, cigarettes, uncomplicated: Secondary | ICD-10-CM | POA: Diagnosis not present

## 2018-03-09 DIAGNOSIS — M1990 Unspecified osteoarthritis, unspecified site: Secondary | ICD-10-CM | POA: Diagnosis not present

## 2018-03-09 MED ORDER — PREDNISONE 10 MG PO TABS: ORAL_TABLET | ORAL | 0 refills | Status: DC

## 2018-03-09 MED ORDER — CYCLOBENZAPRINE HCL 5 MG PO TABS: ORAL_TABLET | ORAL | 0 refills | Status: DC

## 2018-03-09 NOTE — ED Triage Notes (Signed)
Pt reports that she has been having rt sided should blade pain since Saturday. Pt denies injury. Pain hurts with touch and movement. Pt describes pain as "burning".

## 2018-03-09 NOTE — ED Notes (Signed)
See triage note  States she developed some posterior right shoulder on Saturday  States pain radiates into arm and under her right arm and lateral rib area   Unsure of injury but states she has been taking care of her mother  F.R.O.M.   No deformity noted

## 2018-03-09 NOTE — ED Provider Notes (Signed)
[facility] Emergency Department Provider Note  ____________________________________________  Time seen: Approximately 9:16 AM  I have reviewed the triage vital signs and the nursing notes.   HISTORY  Chief Complaint Shoulder Pain    HPI Kendra Clark is a 42 y.o. female presents to emergency department for evaluation of right shoulder pain for 4 days.  Pain is primarily in the back of her shoulder.  She states that the back of her shoulder feels swollen.  Pain occasionally radiates into her right arm.  She has had some numbness on and off in her right fingers.  She states that she has had nerve pain before and this feels the same.  Patient states that she has been taking care of her mother and has been doing a lot of heavy lifting.  No specific injury.  She takes medication for arthritis.  No shortness of breath, chest pain.   Past Medical History:  Diagnosis Date  . Asthma   . Atopy   . Degenerative disc disease, lumbar   . Depression   . Hiatal hernia   . History of mitral valve disease   . Hyperlipidemia   . IBS (irritable bowel syndrome)   . Osteoporosis     Patient Active Problem List   Diagnosis Date Noted  . Coronary artery calcification 03/26/2015  . Morbid obesity (HCC) 03/26/2015    Past Surgical History:  Procedure Laterality Date  . ABDOMINAL HYSTERECTOMY    . CESAREAN SECTION     x 3     Prior to Admission medications   Medication Sig Start Date End Date Taking? Authorizing Provider  diclofenac (VOLTAREN) 75 MG EC tablet Take 75 mg by mouth 2 (two) times daily.   Yes [provider]  albuterol (PROVENTIL) (2.5 MG/3ML) 0.083% nebulizer solution Take 3 mLs (2.5 mg total) by nebulization every 6 (six) hours as needed for wheezing or shortness of breath. Patient not taking: Reported on 02/09/2018 08/20/17   [name], [place] N, MD  azithromycin (ZITHROMAX Z-PAK) 250 MG tablet Take 2 tablets (500 mg) on  Day 1,  followed by 1  tablet (250 mg) once daily on Days 2 through 5. Patient not taking: Reported on 06/10/2017 06/16/15   Tommi RumpsSummers, [name], PA-C  benzonatate (TESSALON PERLES) 100 MG capsule Take 1 capsule (100 mg total) by mouth 3 (three) times daily as needed for cough. Patient not taking: Reported on 02/09/2018 08/20/17 08/20/18  [name], [place] N, MD  chlorpheniramine-HYDROcodone [facility](TUSSIONEX PENNKINETIC ER) 10-8 MG/5ML SUER Take 5 mLs by mouth 2 (two) times daily. Patient not taking: Reported on 06/10/2017 06/16/15   Tommi RumpsSummers, [name], PA-C  chlorpheniramine-HYDROcodone [facility](TUSSIONEX PENNKINETIC ER) 10-8 MG/5ML SUER Take 5 mLs by mouth 2 (two) times daily. Patient not taking: Reported on 02/09/2018 06/10/17   [name], [name], MD  cyclobenzaprine (FLEXERIL) 5 MG tablet Take 1-2 tablets 3 times daily as needed 03/09/18   Enid DerryWagner, Kerie Badger, PA-C  ibuprofen (ADVIL,MOTRIN) 600 MG tablet Take 1 tablet (600 mg total) by mouth every 8 (eight) hours as needed. 02/09/18   Merrily Brittleifenbark, Neil, MD  ipratropium-albuterol (DUONEB) 0.5-2.5 (3) MG/3ML SOLN Take 3 mLs by nebulization every 6 (six) hours as needed. Patient not taking: Reported on 06/10/2017 02/29/16   Hagler, Jami L, PA-C  loratadine (CLARITIN) 10 MG tablet Take 1 tablet (10 mg total) by mouth daily. Patient not taking: Reported on 06/10/2017 02/29/16   Hagler, Jami L, PA-C  methylPREDNISolone (MEDROL DOSEPAK) 4 MG TBPK tablet Take Tapered dose as  directed Patient not taking: Reported on 06/10/2017 05/20/15   [name], PA-C  ondansetron (ZOFRAN ODT) 4 MG disintegrating tablet Take 1 tablet (4 mg total) by mouth every 8 (eight) hours as needed for nausea or vomiting. Patient not taking: Reported on 06/10/2017 01/10/17   Darci Current, MD  orphenadrine (NORFLEX) 100 MG tablet Take 1 tablet (100 mg total) by mouth 2 (two) times daily. Patient not taking: Reported on 06/10/2017 05/20/15   [name], PA-C  oxyCODONE-acetaminophen (PERCOCET) 7.5-325 MG tablet Take 1  tablet by mouth every 4 (four) hours as needed for severe pain. Patient not taking: Reported on 06/10/2017 05/20/15   [name], PA-C  oxyCODONE-acetaminophen (ROXICET) 5-325 MG tablet Take 1 tablet by mouth every 4 (four) hours as needed for severe pain. Patient not taking: Reported on 06/10/2017 01/10/17   Darci Current, MD  predniSONE (DELTASONE) 10 MG tablet Take 6 tablets on day 1, take 5 tablets on day 2, take 4 tablets on day 3, take 3 tablets on day 4, take 2 tablets on day 5, take 1 tablet on day 6 03/09/18   Enid Derry, PA-C  promethazine-dextromethorphan (PROMETHAZINE-DM) 6.25-15 MG/5ML syrup Take 5 mLs by mouth 4 (four) times daily as needed for cough. Patient not taking: Reported on 06/10/2017 02/29/16   Hagler, Jami L, PA-C    Allergies Penicillins and Tramadol  Family History  Problem Relation Age of Onset  . Hypertension Father   . Hyperlipidemia Father   . Hypertension Mother   . Hyperlipidemia Mother   . Arrhythmia Mother        A-fib  . Breast cancer Mother 1  . Breast cancer Paternal Aunt 15  . Breast cancer Paternal Grandmother 58    Social History Social History   Tobacco Use  . Smoking status: Current Every Day Smoker    Packs/day: 1.50    Years: 13.00    Pack years: 19.50    Types: Cigarettes  . Smokeless tobacco: Never Used  Substance Use Topics  . Alcohol use: No  . Drug use: Not on file     Review of Systems  Constitutional: No fever/chills ENT: No upper respiratory complaints. Cardiovascular: No chest pain. Respiratory: No cough. No SOB. Gastrointestinal: No abdominal pain.  No nausea, no vomiting.  Musculoskeletal: Positive for shoulder pain. Skin: Negative for rash, abrasions, lacerations, ecchymosis. Neurological: Negative for headaches, numbness or tingling   ____________________________________________   PHYSICAL EXAM:  VITAL SIGNS: ED Triage Vitals  Enc Vitals Group     BP 03/09/18 0839 127/71     Pulse Rate  03/09/18 0839 63     Resp 03/09/18 0839 16     Temp 03/09/18 0839 98 F (36.7 C)     Temp Source 03/09/18 0839 Oral     SpO2 03/09/18 0839 97 %     Weight 03/09/18 0840 (!) 305 lb (138.3 kg)     Height 03/09/18 0840 5\' 5"  (1.651 m)     Head Circumference --      Peak Flow --      Pain Score 03/09/18 0840 9     Pain Loc --      Pain Edu? --      Excl. in GC? --      Constitutional: Alert and oriented. Well appearing and in no acute distress. Eyes: Conjunctivae are normal. PERRL. EOMI. Head: Atraumatic. ENT:      Ears:      Nose: No congestion/rhinnorhea.  Mouth/Throat: Mucous membranes are moist.  Neck: No stridor.  No cervical spine tenderness to palpation. Cardiovascular: Normal rate, regular rhythm.  Good peripheral circulation. Respiratory: Normal respiratory effort without tachypnea or retractions. Lungs CTAB. Good air entry to the bases with no decreased or absent breath sounds. Gastrointestinal: Bowel sounds 4 quadrants. Soft and nontender to palpation. No guarding or rigidity. No palpable masses. No distention.  Musculoskeletal: Full range of motion to all extremities. No gross deformities appreciated.  Tenderness to palpation over superior and posterior shoulder.  Pain elicited with range of motion of shoulder.  Strength equal in upper extremities bilaterally. Neurologic:  Normal speech and language. No gross focal neurologic deficits are appreciated.  Skin:  Skin is warm, dry and intact. No rash noted. Psychiatric: Mood and affect are normal. Speech and behavior are normal. Patient exhibits appropriate insight and judgement.   ____________________________________________   LABS (all labs ordered are listed, but only abnormal results are displayed)  Labs Reviewed - No data to display ____________________________________________  EKG   ____________________________________________  RADIOLOGY Lexine Baton, personally viewed and evaluated these images  (plain radiographs) as part of my medical decision making, as well as reviewing the written report by the radiologist.  Dg Shoulder Right  Result Date: 03/09/2018 CLINICAL DATA:  Posterior right shoulder pain radiating into the arm and lateral ribcage for the past 2 days. No discrete recollection of injury but the patient does take care of her mother. EXAM: RIGHT SHOULDER - 2+ VIEW COMPARISON:  Limited views of the right shoulder from a chest x-ray of February 09, 2018 FINDINGS: The bones of the shoulder are subjectively adequately mineralized. There is no acute fracture nor dislocation. There are degenerative changes of the [facility] joint. The glenohumeral joint appears normal. IMPRESSION: There is no acute bony abnormality of the right shoulder. Minimal degenerative narrowing of the right AC joint. Electronically Signed   By: David  [place] M.D.   On: 03/09/2018 10:15    ____________________________________________    PROCEDURES  Procedure(s) performed:    Procedures    Medications - No data to display   ____________________________________________   INITIAL IMPRESSION / ASSESSMENT AND PLAN / ED COURSE  Pertinent labs & imaging results that were available during my care of the patient were reviewed by me and considered in my medical decision making (see chart for details).  Review of the [place] CSRS was performed in accordance of the [facility] prior to dispensing any controlled drugs.     Patient's diagnosis is consistent with muscle strain and osteoarthritis.  X-ray consistent with arthritis.  She will avoid taking NSAIDs due to recent stomach issues.  Patient will be discharged home with prescriptions for prednisone and Flexeril. Patient is to follow up with primary care as directed. Patient is given ED precautions to return to the ED for any worsening or new symptoms.     ____________________________________________  FINAL CLINICAL IMPRESSION(S) / ED DIAGNOSES  Final diagnoses:  Acute  pain of right shoulder  Osteoarthritis, unspecified osteoarthritis type, unspecified site      NEW MEDICATIONS STARTED DURING THIS VISIT:  ED Discharge Orders         Ordered    cyclobenzaprine (FLEXERIL) 5 MG tablet     03/09/18 1046    predniSONE (DELTASONE) 10 MG tablet     03/09/18 1046              This chart was dictated using voice recognition software/Dragon. Despite best efforts to proofread, errors can occur which  can change the meaning. Any change was purely unintentional.    Enid Derry, PA-C 03/09/18 1603    Jene Every, MD 03/12/18 [phone]

## 2018-04-06 MED ORDER — DICLOFENAC SODIUM 75 MG TABLET,DELAYED RELEASE
ORAL_TABLET | 0 refills | 0 days | Status: CP
Start: 2018-04-06 — End: 2018-05-17

## 2018-04-27 ENCOUNTER — Encounter: Admit: 2018-04-27 | Discharge: 2018-04-28 | Payer: MEDICARE

## 2018-04-27 ENCOUNTER — Encounter
Admit: 2018-04-27 | Discharge: 2018-04-28 | Payer: MEDICARE | Attending: Foot and Ankle Surgery | Primary: Foot and Ankle Surgery

## 2018-04-27 DIAGNOSIS — M19072 Primary osteoarthritis, left ankle and foot: Secondary | ICD-10-CM

## 2018-04-27 DIAGNOSIS — M19071 Primary osteoarthritis, right ankle and foot: Principal | ICD-10-CM

## 2018-04-27 DIAGNOSIS — M722 Plantar fascial fibromatosis: Secondary | ICD-10-CM

## 2018-04-27 DIAGNOSIS — M79672 Pain in left foot: Principal | ICD-10-CM

## 2018-04-27 MED ORDER — CELECOXIB 100 MG CAPSULE
ORAL_CAPSULE | Freq: Two times a day (BID) | ORAL | 0 refills | 0 days | Status: CP
Start: 2018-04-27 — End: 2019-02-16

## 2018-05-17 MED ORDER — DICLOFENAC SODIUM 75 MG TABLET,DELAYED RELEASE
ORAL_TABLET | 0 refills | 0 days | Status: CP
Start: 2018-05-17 — End: 2019-02-16

## 2018-08-10 ENCOUNTER — Other Ambulatory Visit: Payer: Self-pay | Admitting: Family Medicine

## 2018-08-10 ENCOUNTER — Ambulatory Visit
Admission: RE | Admit: 2018-08-10 | Discharge: 2018-08-10 | Disposition: A | Discharge status: Home / Self Care | Payer: Medicare HMO | Source: Ambulatory Visit | Attending: Family Medicine | Admitting: Family Medicine

## 2018-08-10 DIAGNOSIS — M79604 Pain in right leg: Secondary | ICD-10-CM | POA: Insufficient documentation

## 2018-08-10 DIAGNOSIS — M79605 Pain in left leg: Secondary | ICD-10-CM | POA: Diagnosis present

## 2018-09-27 ENCOUNTER — Ambulatory Visit: Admit: 2018-09-27 | Discharge: 2018-09-28 | Payer: MEDICARE

## 2018-09-29 ENCOUNTER — Encounter: Admit: 2018-09-29 | Discharge: 2018-09-30 | Payer: MEDICARE | Attending: Registered" | Primary: Registered"

## 2018-09-29 DIAGNOSIS — Z713 Dietary counseling and surveillance: Principal | ICD-10-CM

## 2018-09-29 DIAGNOSIS — Z6841 Body Mass Index (BMI) 40.0 and over, adult: Secondary | ICD-10-CM

## 2018-10-08 ENCOUNTER — Ambulatory Visit (INDEPENDENT_AMBULATORY_CARE_PROVIDER_SITE_OTHER): Payer: Medicare Other | Admitting: Psychiatry

## 2018-10-08 ENCOUNTER — Other Ambulatory Visit: Payer: Self-pay

## 2018-10-08 ENCOUNTER — Encounter: Payer: Self-pay | Admitting: Psychiatry

## 2018-10-08 DIAGNOSIS — F5105 Insomnia due to other mental disorder: Secondary | ICD-10-CM

## 2018-10-08 DIAGNOSIS — F4310 Post-traumatic stress disorder, unspecified: Secondary | ICD-10-CM | POA: Diagnosis not present

## 2018-10-08 DIAGNOSIS — F410 Panic disorder [episodic paroxysmal anxiety] without agoraphobia: Secondary | ICD-10-CM | POA: Diagnosis not present

## 2018-10-08 DIAGNOSIS — F3130 Bipolar disorder, current episode depressed, mild or moderate severity, unspecified: Secondary | ICD-10-CM | POA: Diagnosis not present

## 2018-10-08 DIAGNOSIS — F1021 Alcohol dependence, in remission: Secondary | ICD-10-CM

## 2018-10-08 DIAGNOSIS — F1210 Cannabis abuse, uncomplicated: Secondary | ICD-10-CM

## 2018-10-08 MED ORDER — ZIPRASIDONE HCL 40 MG PO CAPS: 40.0000 mg | ORAL_CAPSULE | Freq: Every day | ORAL | 1 refills | Status: DC

## 2018-10-08 MED ORDER — HYDROXYZINE PAMOATE 25 MG PO CAPS: 25.0000 mg | ORAL_CAPSULE | Freq: Two times a day (BID) | ORAL | 1 refills | Status: DC | PRN

## 2018-10-08 NOTE — Progress Notes (Signed)
Spoke with patient. Reviewed and updated medical history, surgical history, allergies. Medication and pharmacy reviewed and updated. No vitals taken due to phone visit.
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
## 2018-10-14 ENCOUNTER — Ambulatory Visit (INDEPENDENT_AMBULATORY_CARE_PROVIDER_SITE_OTHER): Payer: Medicare Other | Admitting: Licensed Clinical Social Worker

## 2018-10-14 ENCOUNTER — Other Ambulatory Visit: Payer: Self-pay

## 2018-10-14 DIAGNOSIS — F3130 Bipolar disorder, current episode depressed, mild or moderate severity, unspecified: Secondary | ICD-10-CM

## 2018-10-30 ENCOUNTER — Other Ambulatory Visit: Payer: Self-pay | Admitting: Psychiatry

## 2018-10-30 DIAGNOSIS — F410 Panic disorder [episodic paroxysmal anxiety] without agoraphobia: Secondary | ICD-10-CM

## 2018-10-30 DIAGNOSIS — F4310 Post-traumatic stress disorder, unspecified: Secondary | ICD-10-CM

## 2018-10-30 DIAGNOSIS — F3130 Bipolar disorder, current episode depressed, mild or moderate severity, unspecified: Secondary | ICD-10-CM

## 2018-11-01 ENCOUNTER — Other Ambulatory Visit: Payer: Self-pay

## 2018-11-01 ENCOUNTER — Encounter: Admit: 2018-11-01 | Discharge: 2018-11-02 | Payer: MEDICARE

## 2018-11-01 ENCOUNTER — Ambulatory Visit (INDEPENDENT_AMBULATORY_CARE_PROVIDER_SITE_OTHER): Payer: Medicare Other | Admitting: Licensed Clinical Social Worker

## 2018-11-01 DIAGNOSIS — F3130 Bipolar disorder, current episode depressed, mild or moderate severity, unspecified: Secondary | ICD-10-CM | POA: Diagnosis not present

## 2018-11-01 DIAGNOSIS — F4310 Post-traumatic stress disorder, unspecified: Secondary | ICD-10-CM

## 2018-11-01 NOTE — Progress Notes (Signed)
Virtual Visit via Telephone Note  I connected with Kendra Clark on 11/01/18 at 11:00 AM EDT by telephone and verified that I am speaking with the correct person using two identifiers.  Location: Patient: Home Provider: [facility]   I discussed the limitations, risks, security and privacy concerns of performing an evaluation and management service by telephone and the availability of in person appointments. I also discussed with the patient that there may be a patient responsible charge related to this service. The patient expressed understanding and agreed to proceed.   History of Present Illness: PTSD & Bipolar    Observations/Objective: Therapist met with Patient in an initial therapy session to assess current mood and to build rapport. Therapist engaged Patient in discussion about her life and what is going well for her. Therapist provided support for Patient as she shared details about her life, her current stressors, mood, coping skills, her past, and her children. Therapist prompted Patient to discuss her support system and ways that she manages her daily stress, anger, and frustrations.  LCSW discussed what psychotherapy is and is not and the importance of the therapeutic relationship to include open and honest communication between client and therapist and building trust.  Reviewed advantages and disadvantages of the therapeutic process and limitations to the therapeutic relationship including LCSW's role in maintaining the safety of the client, others and those in client's care.   Assessment and Plan: Journal and deep breathing techniques   Follow Up Instructions: 2 weeks    I discussed the assessment and treatment plan with the patient. The patient was provided an opportunity to ask questions and all were answered. The patient agreed with the plan and demonstrated an understanding of the instructions.   The patient was advised to call back or seek an in-person evaluation if the  symptoms worsen or if the condition fails to improve as anticipated.  I provided 60 minutes of non-face-to-face time during this encounter.   Lubertha South, LCSW
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
## 2018-11-03 ENCOUNTER — Encounter: Payer: Self-pay | Admitting: Psychiatry

## 2018-11-03 ENCOUNTER — Ambulatory Visit: Admit: 2018-11-03 | Discharge: 2018-11-04 | Payer: MEDICARE

## 2018-11-03 ENCOUNTER — Other Ambulatory Visit: Payer: Self-pay

## 2018-11-03 ENCOUNTER — Ambulatory Visit (INDEPENDENT_AMBULATORY_CARE_PROVIDER_SITE_OTHER): Payer: Medicare Other | Admitting: Psychiatry

## 2018-11-03 ENCOUNTER — Ambulatory Visit (INDEPENDENT_AMBULATORY_CARE_PROVIDER_SITE_OTHER): Payer: Medicare Other | Admitting: Licensed Clinical Social Worker

## 2018-11-03 DIAGNOSIS — Z1159 Encounter for screening for other viral diseases: Principal | ICD-10-CM

## 2018-11-03 DIAGNOSIS — F3130 Bipolar disorder, current episode depressed, mild or moderate severity, unspecified: Secondary | ICD-10-CM

## 2018-11-03 DIAGNOSIS — F5105 Insomnia due to other mental disorder: Secondary | ICD-10-CM

## 2018-11-03 DIAGNOSIS — F4310 Post-traumatic stress disorder, unspecified: Secondary | ICD-10-CM

## 2018-11-03 DIAGNOSIS — F410 Panic disorder [episodic paroxysmal anxiety] without agoraphobia: Secondary | ICD-10-CM | POA: Diagnosis not present

## 2018-11-03 DIAGNOSIS — F1021 Alcohol dependence, in remission: Secondary | ICD-10-CM

## 2018-11-03 DIAGNOSIS — F1210 Cannabis abuse, uncomplicated: Secondary | ICD-10-CM
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
## 2018-11-04 DIAGNOSIS — Z01818 Encounter for other preprocedural examination: Principal | ICD-10-CM

## 2018-11-05 DIAGNOSIS — Z01818 Encounter for other preprocedural examination: Principal | ICD-10-CM

## 2018-11-05 NOTE — Progress Notes (Signed)
Left message encouraging contact 

## 2018-11-16 ENCOUNTER — Encounter: Admit: 2018-11-16 | Discharge: 2018-11-17 | Payer: MEDICARE | Attending: Family | Primary: Family

## 2018-11-16 DIAGNOSIS — Z0000 Encounter for general adult medical examination without abnormal findings: Secondary | ICD-10-CM

## 2018-11-16 DIAGNOSIS — R635 Abnormal weight gain: Secondary | ICD-10-CM

## 2018-11-16 DIAGNOSIS — K219 Gastro-esophageal reflux disease without esophagitis: Secondary | ICD-10-CM

## 2018-11-16 DIAGNOSIS — Z6841 Body Mass Index (BMI) 40.0 and over, adult: Secondary | ICD-10-CM

## 2018-11-16 DIAGNOSIS — Z1159 Encounter for screening for other viral diseases: Secondary | ICD-10-CM

## 2018-11-16 DIAGNOSIS — F419 Anxiety disorder, unspecified: Secondary | ICD-10-CM

## 2018-11-16 DIAGNOSIS — J4540 Moderate persistent asthma, uncomplicated: Principal | ICD-10-CM

## 2018-11-16 DIAGNOSIS — Z8639 Personal history of other endocrine, nutritional and metabolic disease: Secondary | ICD-10-CM

## 2018-11-16 MED ORDER — ESOMEPRAZOLE MAGNESIUM 40 MG CAPSULE,DELAYED RELEASE
ORAL_CAPSULE | Freq: Every morning | ORAL | 0 refills | 0 days | Status: CP
Start: 2018-11-16 — End: 2018-12-06

## 2018-11-16 MED ORDER — ALBUTEROL SULFATE HFA 90 MCG/ACTUATION AEROSOL INHALER
Freq: Four times a day (QID) | RESPIRATORY_TRACT | 3 refills | 0.00000 days | Status: CP | PRN
Start: 2018-11-16 — End: 2019-02-11

## 2018-11-19 MED ORDER — ERGOCALCIFEROL (VITAMIN D2) 1,250 MCG (50,000 UNIT) CAPSULE
ORAL_CAPSULE | ORAL | 6 refills | 0 days | Status: CP
Start: 2018-11-19 — End: 2019-02-16

## 2018-12-01 ENCOUNTER — Encounter: Admit: 2018-12-01 | Discharge: 2018-12-02 | Payer: MEDICARE | Attending: Family | Primary: Family

## 2018-12-01 DIAGNOSIS — Z1159 Encounter for screening for other viral diseases: Principal | ICD-10-CM

## 2018-12-03 ENCOUNTER — Encounter: Admit: 2018-12-03 | Discharge: 2018-12-03 | Payer: MEDICARE

## 2018-12-03 ENCOUNTER — Encounter
Admit: 2018-12-03 | Discharge: 2018-12-03 | Payer: MEDICARE | Attending: Certified Registered" | Primary: Certified Registered"

## 2018-12-06 MED ORDER — ESOMEPRAZOLE MAGNESIUM 40 MG CAPSULE,DELAYED RELEASE
ORAL_CAPSULE | Freq: Two times a day (BID) | ORAL | 0 refills | 0.00000 days | Status: CP
Start: 2018-12-06 — End: 2019-02-11

## 2018-12-07 ENCOUNTER — Telehealth: Admit: 2018-12-07 | Discharge: 2018-12-08 | Payer: MEDICARE | Attending: Registered" | Primary: Registered"

## 2018-12-07 ENCOUNTER — Other Ambulatory Visit: Payer: Self-pay | Admitting: Nurse Practitioner

## 2018-12-07 DIAGNOSIS — Z713 Dietary counseling and surveillance: Principal | ICD-10-CM

## 2018-12-07 DIAGNOSIS — Z6841 Body Mass Index (BMI) 40.0 and over, adult: Secondary | ICD-10-CM

## 2018-12-15 ENCOUNTER — Other Ambulatory Visit: Payer: Self-pay

## 2018-12-15 ENCOUNTER — Ambulatory Visit: Payer: Medicare Other | Admitting: Psychiatry

## 2018-12-24 ENCOUNTER — Emergency Department: Payer: Medicare Other

## 2018-12-24 ENCOUNTER — Encounter: Payer: Self-pay | Admitting: Radiology

## 2018-12-24 ENCOUNTER — Emergency Department
Admission: EM | Admit: 2018-12-24 | Discharge: 2018-12-24 | Disposition: A | Discharge status: Home / Self Care | Payer: Medicare Other | Attending: Emergency Medicine | Admitting: Emergency Medicine

## 2018-12-24 ENCOUNTER — Other Ambulatory Visit: Payer: Self-pay

## 2018-12-24 DIAGNOSIS — J45909 Unspecified asthma, uncomplicated: Secondary | ICD-10-CM | POA: Insufficient documentation

## 2018-12-24 DIAGNOSIS — K0889 Other specified disorders of teeth and supporting structures: Secondary | ICD-10-CM

## 2018-12-24 DIAGNOSIS — Z79899 Other long term (current) drug therapy: Secondary | ICD-10-CM | POA: Diagnosis not present

## 2018-12-24 DIAGNOSIS — F17210 Nicotine dependence, cigarettes, uncomplicated: Secondary | ICD-10-CM | POA: Insufficient documentation

## 2018-12-24 DIAGNOSIS — Z9889 Other specified postprocedural states: Secondary | ICD-10-CM | POA: Insufficient documentation

## 2018-12-24 LAB — BASIC METABOLIC PANEL
Anion gap: 10 (ref 5–15)
BUN: 11 mg/dL (ref 6–20)
CO2: 24 mmol/L (ref 22–32)
Calcium: 8.9 mg/dL (ref 8.9–10.3)
Chloride: 106 mmol/L (ref 98–111)
Creatinine, Ser: 0.7 mg/dL (ref 0.44–1.00)
GFR calc Af Amer: >60 mL/min (ref >60)
GFR calc non Af Amer: >60 mL/min (ref >60)
Glucose, Bld: 115 mg/dL — ABNORMAL HIGH (ref 70–99)
Potassium: 3.4 mmol/L — ABNORMAL LOW (ref 3.5–5.1)
Sodium: 140 mmol/L (ref 135–145)

## 2018-12-24 LAB — CBC WITH DIFFERENTIAL/PLATELET
Abs Immature Granulocytes: 0.03 10*3/uL (ref 0.00–0.07)
Basophils Absolute: 0.1 10*3/uL (ref 0.0–0.1)
Basophils Relative: 1 %
Eosinophils Absolute: 0.1 10*3/uL (ref 0.0–0.5)
Eosinophils Relative: 1 %
HCT: 42.2 % (ref 36.0–46.0)
Hemoglobin: 14.1 g/dL (ref 12.0–15.0)
Immature Granulocytes: 0 %
Lymphocytes Relative: 51 %
Lymphs Abs: 4.1 10*3/uL — ABNORMAL HIGH (ref 0.7–4.0)
MCH: 31.1 pg (ref 26.0–34.0)
MCHC: 33.4 g/dL (ref 30.0–36.0)
MCV: 93.2 fL (ref 80.0–100.0)
Monocytes Absolute: 0.6 10*3/uL (ref 0.1–1.0)
Monocytes Relative: 7 %
Neutro Abs: 3.3 10*3/uL (ref 1.7–7.7)
Neutrophils Relative %: 40 %
Platelets: 295 10*3/uL (ref 150–400)
RBC: 4.53 MIL/uL (ref 3.87–5.11)
RDW: 12.2 % (ref 11.5–15.5)
WBC: 8.2 10*3/uL (ref 4.0–10.5)
nRBC: 0 % (ref 0.0–0.2)

## 2018-12-24 MED ORDER — IBUPROFEN 600 MG PO TABS: 600.0000 mg | ORAL_TABLET | Freq: Three times a day (TID) | ORAL | 0 refills | Status: DC | PRN

## 2018-12-24 MED ORDER — LIDOCAINE VISCOUS HCL 2 % MT SOLN: 5.0000 mL | Freq: Four times a day (QID) | OROMUCOSAL | 0 refills | Status: DC | PRN

## 2018-12-24 MED ORDER — OXYCODONE-ACETAMINOPHEN 7.5-325 MG PO TABS: 1.0000 | ORAL_TABLET | Freq: Four times a day (QID) | ORAL | 0 refills | Status: AC | PRN

## 2018-12-24 MED ORDER — IOHEXOL 300 MG/ML  SOLN
75.0000 mL | Freq: Once | INTRAMUSCULAR | Status: AC | PRN
  Administered 2018-12-24: 75 mL via INTRAVENOUS
  Filled 2018-12-24: qty 75

## 2018-12-24 MED ORDER — HYDROMORPHONE HCL 1 MG/ML IJ SOLN
1.0000 mg | Freq: Once | INTRAMUSCULAR | Status: AC
  Administered 2018-12-24: 1 mg via INTRAVENOUS
  Filled 2018-12-24: qty 1

## 2018-12-24 NOTE — ED Notes (Signed)
See triage note  Presents with dental pain  States she had a tooth pulled last Thursday  Was placed on clinda for 7 days    States she is having more pain and swelling  Placed a called back to office w/o results

## 2018-12-24 NOTE — ED Provider Notes (Signed)
[facility] Emergency Department Provider Note   ____________________________________________   First MD Initiated Contact with Patient 12/24/18 1132     (approximate)  I have reviewed the triage vital signs and the nursing notes.   HISTORY  Chief Complaint No chief complaint on file.    HPI Kendra Clark is a 43 y.o. female patient complains of dental pain secondary to a tooth extraction 8 days ago.  Patient states she has called her dentist all this week and was told that another antibiotic will be prescribed.  Patient states she is currently taking clindamycin.  Patient concerned today due to increased swelling around the tooth and she believes swelling to the right lateral neck.  Patient rates the pain as a 10/10.  Patient described the pain is "achy".  Patient state unable to tolerate solid foods secondary to the pain and edema.         Past Medical History:  Diagnosis Date  . Anxiety   . Asthma   . Atopy   . Bipolar disorder (HCC)   . Degenerative disc disease, lumbar   . Depression   . Hiatal hernia   . History of mitral valve disease   . Hyperlipidemia   . IBS (irritable bowel syndrome)   . Osteoporosis   . PTSD (post-traumatic stress disorder)     Patient Active Problem List   Diagnosis Date Noted  . Coronary artery calcification 03/26/2015  . Morbid obesity (HCC) 03/26/2015    Past Surgical History:  Procedure Laterality Date  . ABDOMINAL HYSTERECTOMY    . CESAREAN SECTION     x 3   . GALLBLADDER SURGERY    . TONSILLECTOMY AND ADENOIDECTOMY      Prior to Admission medications   Medication Sig Start Date End Date Taking? Authorizing Provider  azelastine (OPTIVAR) 0.05 % ophthalmic solution INSTILL 1 DROP INTO BOTH EYES TWICE A DAY FOR 10 DAYS AS NEEDED 03/03/18   [provider]  celecoxib (CELEBREX) 100 MG capsule Take by mouth. 04/27/18 04/27/19  [provider]  cyclobenzaprine (FLEXERIL) 5 MG tablet  Take 1-2 tablets 3 times daily as needed 03/09/18   Enid DerryWagner, Ashley, PA-C  diclofenac (VOLTAREN) 75 MG EC tablet Take 75 mg by mouth 2 (two) times daily.    [provider]  esomeprazole (NEXIUM) 40 MG capsule Take by mouth. 06/26/15   [provider]  furosemide (LASIX) 20 MG tablet  08/26/18   [provider]  hydrOXYzine (VISTARIL) 25 MG capsule TAKE 1 CAPSULE BY MOUTH 2 (TWO) TIMES DAILY AS NEEDED. FOR SEVERE ANXIETY ATTACKS ONLY, SLEEP 11/01/18   Jomarie LongsEappen, Saramma, MD  ibuprofen (ADVIL) 600 MG tablet Take 1 tablet (600 mg total) by mouth every 8 (eight) hours as needed. 12/24/18   [name], Murtaza Shell K, PA-C  ibuprofen (ADVIL,MOTRIN) 600 MG tablet Take 1 tablet (600 mg total) by mouth every 8 (eight) hours as needed. 02/09/18   Merrily Brittleifenbark, Neil, MD  lidocaine (XYLOCAINE) 2 % solution Use as directed 5 mLs in the mouth or throat every 6 (six) hours as needed for mouth pain. 12/24/18   [name], Naman Spychalski K, PA-C  lubiprostone (AMITIZA) 24 MCG capsule Take by mouth. 04/06/08   [provider]  oxyCODONE-acetaminophen (PERCOCET) 7.5-325 MG tablet Take 1 tablet by mouth every 6 (six) hours as needed for up to 5 days. 12/24/18 12/29/18  [name], Hardy Harcum K, PA-C  umeclidinium bromide (INCRUSE ELLIPTA) 62.5 MCG/INH AEPB  09/13/18   [provider]  ziprasidone (  GEODON) 40 MG capsule TAKE 1 CAPSULE (40 MG TOTAL) BY MOUTH DAILY WITH SUPPER. MOOD 11/01/18   Jomarie LongsEappen, Saramma, MD    Allergies Penicillins and Tramadol  Family History  Problem Relation Age of Onset  . Hypertension Father   . Hyperlipidemia Father   . Hypertension Mother   . Hyperlipidemia Mother   . Arrhythmia Mother        A-fib  . Breast cancer Mother [phone]  . Breast cancer Paternal Aunt [phone]  . Breast cancer Paternal Grandmother [phone]    Social History Social History   Tobacco Use  . Smoking status: Current Every Day Smoker    Packs/day: 1.50    Years: 13.00    Pack years: 19.50    Types: Cigarettes  .  Smokeless tobacco: Never Used  Substance Use Topics  . Alcohol use: No  . Drug use: Yes    Types: Marijuana    Review of Systems  Constitutional: No fever/chills Eyes: No visual changes. ENT: No sore throat. Cardiovascular: Denies chest pain. Respiratory: Denies shortness of breath. Gastrointestinal: No abdominal pain.  No nausea, no vomiting.  No diarrhea.  No constipation. Genitourinary: Negative for dysuria. Musculoskeletal: Negative for back pain. Skin: Negative for rash. Neurological: Negative for headaches, focal weakness or numbness. Psychiatric:  Anxiety, bipolar, and PTSD.   Endocrine:  Hyperlipidemia.  Allergic/Immunilogical: Penicillin and tramadol.  ____________________________________________   PHYSICAL EXAM:  VITAL SIGNS: ED Triage Vitals  Enc Vitals Group     BP 12/24/18 1048 135/84     Pulse Rate 12/24/18 1048 67     Resp 12/24/18 1048 17     Temp 12/24/18 1048 98.6 F (37 C)     Temp Source 12/24/18 1048 Oral     SpO2 12/24/18 1048 98 %     Weight 12/24/18 1049 (!) 310 lb (140.6 kg)     Height 12/24/18 1049 5\' 5"  (1.651 m)     Head Circumference --      Peak Flow --      Pain Score 12/24/18 1049 10     Pain Loc --      Pain Edu? --      Excl. in GC? --     Constitutional: Alert and oriented.  Anxious.   Mouth/Throat: Mucous membranes are moist.  Oropharynx non-erythematous.  Mild edema from extraction site on her right lower molar. Neck: No stridor.   Hematological/Lymphatic/Immunilogical: No cervical lymphadenopathy. Cardiovascular: Normal rate, regular rhythm. Grossly normal heart sounds.  Good peripheral circulation. Respiratory: Normal respiratory effort.  No retractions. Lungs CTAB. Gastrointestinal: Soft and nontender. No distention. No abdominal bruits. No CVA tenderness. Musculoskeletal: No lower extremity tenderness nor edema.  No joint effusions. Neurologic:  Normal speech and language. No gross focal neurologic deficits are  appreciated. No gait instability. Skin:  Skin is warm, dry and intact. No rash noted. Psychiatric: Mood and affect are normal. Speech and behavior are normal.  ____________________________________________   LABS (all labs ordered are listed, but only abnormal results are displayed)  Labs Reviewed  BASIC METABOLIC PANEL - Abnormal; Notable for the following components:      Result Value   Potassium 3.4 (*)    Glucose, Bld 115 (*)    All other components within normal limits  CBC WITH DIFFERENTIAL/PLATELET - Abnormal; Notable for the following components:   Lymphs Abs 4.1 (*)    All other components within normal limits   ____________________________________________  EKG   ____________________________________________  RADIOLOGY  ED MD interpretation:  Official radiology report(s): Ct Maxillofacial W Contrast  Result Date: 12/24/2018 CLINICAL DATA:  Right tooth extraction 8 days ago with subsequent pain and swelling of the face. EXAM: CT MAXILLOFACIAL WITH CONTRAST TECHNIQUE: Multidetector CT imaging of the maxillofacial structures was performed with intravenous contrast. Multiplanar CT image reconstructions were also generated. CONTRAST:  37mL OMNIPAQUE IOHEXOL 300 MG/ML  SOLN COMPARISON:  None. FINDINGS: Osseous: There has been recent extraction of the right mandibular molar tooth 31. Extraction socket appears unremarkable. No evidence of additional bone destruction. No evidence of fracture. No other significant dental or periodontal disease is identified. Orbits: Normal Sinuses: Normal Soft tissues: Soft tissues of the face appear unremarkable by CT. I do not see definite CT evidence of cellulitis. Certainly there is no sign of abscess or adenopathy. Limited intracranial: Negative IMPRESSION: By CT, there is an uncomplicated appearance following extraction of the right mandibular molar tooth 31. The extraction socket appears unremarkable. No evidence of fracture or osteomyelitis.  No CT evidence of any notable soft tissue inflammation. Electronically Signed   By: Nelson Chimes M.D.   On: 12/24/2018 13:50    ____________________________________________   PROCEDURES  Procedure(s) performed (including Critical Care):  Procedures   ____________________________________________   INITIAL IMPRESSION / ASSESSMENT AND PLAN / ED COURSE  As part of my medical decision making, I reviewed the following data within the [place]         Patient presents with dental pain secondary to tooth extraction 8 days ago.  Differential consist of abscess or dry socket.  Further evaluation with CT of the maxillofacial area with contrast showed no abnormalities.  Patient given discharge care instructions, advised to continue previous medication from dentist, and follow-up with treating dentist as soon as possible.      ____________________________________________   FINAL CLINICAL IMPRESSION(S) / ED DIAGNOSES  Final diagnoses:  Pain, dental     ED Discharge Orders         Ordered    lidocaine (XYLOCAINE) 2 % solution  Every 6 hours PRN     12/24/18 1409    oxyCODONE-acetaminophen (PERCOCET) 7.5-325 MG tablet  Every 6 hours PRN     12/24/18 1409    ibuprofen (ADVIL) 600 MG tablet  Every 8 hours PRN     12/24/18 1409           Note:  This document was prepared using Dragon voice recognition software and may include unintentional dictation errors.    Sable Feil, PA-C 12/24/18 1412    Nena Polio, MD 12/28/18 1730

## 2018-12-24 NOTE — Discharge Instructions (Addendum)
Continue previous medication and follow-up with treating dentist.

## 2018-12-24 NOTE — ED Triage Notes (Signed)
Pt reports having a tooth pulled on Thursday, has been calling her dentist since Monday due to pain to right side of face. Pt reports they called in another antibiotic. Pt reports swelling around the tooth removal side.

## 2019-01-29 ENCOUNTER — Emergency Department
Admission: EM | Admit: 2019-01-29 | Discharge: 2019-01-29 | Disposition: A | Discharge status: Home / Self Care | Payer: Medicare Other | Attending: Emergency Medicine | Admitting: Emergency Medicine

## 2019-01-29 ENCOUNTER — Other Ambulatory Visit: Payer: Self-pay

## 2019-01-29 DIAGNOSIS — Z885 Allergy status to narcotic agent status: Secondary | ICD-10-CM | POA: Insufficient documentation

## 2019-01-29 DIAGNOSIS — R0602 Shortness of breath: Secondary | ICD-10-CM | POA: Insufficient documentation

## 2019-01-29 DIAGNOSIS — R438 Other disturbances of smell and taste: Secondary | ICD-10-CM | POA: Insufficient documentation

## 2019-01-29 DIAGNOSIS — J45909 Unspecified asthma, uncomplicated: Secondary | ICD-10-CM | POA: Diagnosis not present

## 2019-01-29 DIAGNOSIS — R42 Dizziness and giddiness: Secondary | ICD-10-CM | POA: Insufficient documentation

## 2019-01-29 DIAGNOSIS — Z20828 Contact with and (suspected) exposure to other viral communicable diseases: Secondary | ICD-10-CM | POA: Diagnosis not present

## 2019-01-29 DIAGNOSIS — Z880 Allergy status to penicillin: Secondary | ICD-10-CM | POA: Diagnosis not present

## 2019-01-29 DIAGNOSIS — Z20822 Contact with and (suspected) exposure to covid-19: Secondary | ICD-10-CM

## 2019-01-29 DIAGNOSIS — F17210 Nicotine dependence, cigarettes, uncomplicated: Secondary | ICD-10-CM | POA: Insufficient documentation

## 2019-01-29 DIAGNOSIS — R05 Cough: Secondary | ICD-10-CM | POA: Insufficient documentation

## 2019-01-29 DIAGNOSIS — Z79899 Other long term (current) drug therapy: Secondary | ICD-10-CM | POA: Insufficient documentation

## 2019-01-29 DIAGNOSIS — R110 Nausea: Secondary | ICD-10-CM | POA: Diagnosis not present

## 2019-01-29 LAB — URINALYSIS, COMPLETE (UACMP) WITH MICROSCOPIC
Bilirubin Urine: NEGATIVE
Glucose, UA: NEGATIVE mg/dL
Hgb urine dipstick: NEGATIVE
Ketones, ur: NEGATIVE mg/dL
Leukocytes,Ua: NEGATIVE
Nitrite: NEGATIVE
Protein, ur: NEGATIVE mg/dL
Specific Gravity, Urine: 1.013 (ref 1.005–1.030)
pH: 6 (ref 5.0–8.0)

## 2019-01-29 LAB — COMPREHENSIVE METABOLIC PANEL
ALT: 21 U/L (ref 0–44)
AST: 18 U/L (ref 15–41)
Albumin: 3.6 g/dL (ref 3.5–5.0)
Alkaline Phosphatase: 48 U/L (ref 38–126)
Anion gap: 9 (ref 5–15)
BUN: 10 mg/dL (ref 6–20)
CO2: 27 mmol/L (ref 22–32)
Calcium: 9.2 mg/dL (ref 8.9–10.3)
Chloride: 105 mmol/L (ref 98–111)
Creatinine, Ser: 0.91 mg/dL (ref 0.44–1.00)
GFR calc Af Amer: >60 mL/min (ref >60)
GFR calc non Af Amer: >60 mL/min (ref >60)
Glucose, Bld: 127 mg/dL — ABNORMAL HIGH (ref 70–99)
Potassium: 3.7 mmol/L (ref 3.5–5.1)
Sodium: 141 mmol/L (ref 135–145)
Total Bilirubin: 0.6 mg/dL (ref 0.3–1.2)
Total Protein: 7.3 g/dL (ref 6.5–8.1)

## 2019-01-29 LAB — CBC
HCT: 42.2 % (ref 36.0–46.0)
Hemoglobin: 14.2 g/dL (ref 12.0–15.0)
MCH: 31.1 pg (ref 26.0–34.0)
MCHC: 33.6 g/dL (ref 30.0–36.0)
MCV: 92.3 fL (ref 80.0–100.0)
Platelets: 316 10*3/uL (ref 150–400)
RBC: 4.57 MIL/uL (ref 3.87–5.11)
RDW: 12.3 % (ref 11.5–15.5)
WBC: 11 10*3/uL — ABNORMAL HIGH (ref 4.0–10.5)
nRBC: 0 % (ref 0.0–0.2)

## 2019-01-29 LAB — LIPASE, BLOOD: Lipase: 45 U/L (ref 11–51)

## 2019-01-29 MED ORDER — DEXAMETHASONE SODIUM PHOSPHATE 10 MG/ML IJ SOLN
10.0000 mg | Freq: Once | INTRAMUSCULAR | Status: AC
  Administered 2019-01-29: 10 mg via INTRAMUSCULAR
  Filled 2019-01-29: qty 1

## 2019-01-29 NOTE — Discharge Instructions (Addendum)
Please seek medical attention for any high fevers, chest pain, shortness of breath, change in behavior, persistent vomiting, bloody stool or any other new or concerning symptoms.  

## 2019-01-29 NOTE — ED Provider Notes (Signed)
[facility] Emergency Department Provider Note   ____________________________________________   I have reviewed the triage vital signs and the nursing notes.   HISTORY  Chief Complaint Nausea  History limited by: Not Limited   HPI Kendra Clark is a 43 y.o. female who presents to the emergency department today with primary complaint of nausea. The patient has complaints of multiple symptoms that started earlier this week. She says she feels like she has a respiratory infection because she has had some cough. Has been using her nebulizer at home for cough and shortness of breath. Additionally the patient complains of chills. For the past couple of days she has had nausea and dizziness with movement. Today the patient lost her sense of taste. Denies any known sick contacts.    Records reviewed. Per medical record review patient has a history of asthma.   Past Medical History:  Diagnosis Date  . Anxiety   . Asthma   . Atopy   . Bipolar disorder (HCC)   . Degenerative disc disease, lumbar   . Depression   . Hiatal hernia   . History of mitral valve disease   . Hyperlipidemia   . IBS (irritable bowel syndrome)   . Osteoporosis   . PTSD (post-traumatic stress disorder)     Patient Active Problem List   Diagnosis Date Noted  . Coronary artery calcification 03/26/2015  . Morbid obesity (HCC) 03/26/2015    Past Surgical History:  Procedure Laterality Date  . ABDOMINAL HYSTERECTOMY    . ABDOMINAL HYSTERECTOMY    . CESAREAN SECTION     x 3   . GALLBLADDER SURGERY    . TONSILLECTOMY AND ADENOIDECTOMY      Prior to Admission medications   Medication Sig Start Date End Date Taking? Authorizing Provider  azelastine (OPTIVAR) 0.05 % ophthalmic solution INSTILL 1 DROP INTO BOTH EYES TWICE A DAY FOR 10 DAYS AS NEEDED 03/03/18   [provider]  celecoxib (CELEBREX) 100 MG capsule Take by mouth. 04/27/18 04/27/19  [provider]   cyclobenzaprine (FLEXERIL) 5 MG tablet Take 1-2 tablets 3 times daily as needed 03/09/18   Enid DerryWagner, Ashley, PA-C  diclofenac (VOLTAREN) 75 MG EC tablet Take 75 mg by mouth 2 (two) times daily.    [provider]  esomeprazole (NEXIUM) 40 MG capsule Take by mouth. 06/26/15   [provider]  furosemide (LASIX) 20 MG tablet  08/26/18   [provider]  hydrOXYzine (VISTARIL) 25 MG capsule TAKE 1 CAPSULE BY MOUTH 2 (TWO) TIMES DAILY AS NEEDED. FOR SEVERE ANXIETY ATTACKS ONLY, SLEEP 11/01/18   Jomarie LongsEappen, Saramma, MD  ibuprofen (ADVIL) 600 MG tablet Take 1 tablet (600 mg total) by mouth every 8 (eight) hours as needed. 12/24/18   [name], [name], PA-C  ibuprofen (ADVIL,MOTRIN) 600 MG tablet Take 1 tablet (600 mg total) by mouth every 8 (eight) hours as needed. 02/09/18   Merrily Brittleifenbark, Neil, MD  lidocaine (XYLOCAINE) 2 % solution Use as directed 5 mLs in the mouth or throat every 6 (six) hours as needed for mouth pain. 12/24/18   [name], [name], PA-C  lubiprostone (AMITIZA) 24 MCG capsule Take by mouth. 04/06/08   [provider]  umeclidinium bromide (INCRUSE ELLIPTA) 62.5 MCG/INH AEPB  09/13/18   [provider]  ziprasidone (GEODON) 40 MG capsule TAKE 1 CAPSULE (40 MG TOTAL) BY MOUTH DAILY WITH SUPPER. MOOD 11/01/18   Jomarie LongsEappen, Saramma, MD    Allergies Penicillins and Tramadol  Family History  Problem Relation Age of Onset  . Hypertension Father   . Hyperlipidemia Father   . Hypertension Mother   . Hyperlipidemia Mother   . Arrhythmia Mother        A-fib  . Breast cancer Mother 31  . Breast cancer Paternal Aunt 79  . Breast cancer Paternal Grandmother 71    Social History Social History   Tobacco Use  . Smoking status: Current Every Day Smoker    Packs/day: 1.50    Years: 13.00    Pack years: 19.50    Types: Cigarettes  . Smokeless tobacco: Never Used  Substance Use Topics  . Alcohol use: No  . Drug use: Yes    Types: Marijuana    Review of  Systems Constitutional: Positive for chills. Eyes: No visual changes. ENT: Positive for sore throat.  Cardiovascular: Denies chest pain. Respiratory: Positive for cough and shortness of breath. Gastrointestinal: No abdominal pain.  Positive for nausea.   Genitourinary: Negative for dysuria. Musculoskeletal: Negative for back pain. Skin: Negative for rash. Neurological: Positive for headache.  ____________________________________________   PHYSICAL EXAM:  VITAL SIGNS: ED Triage Vitals  Enc Vitals Group     BP 01/29/19 1500 (!) 146/91     Pulse Rate 01/29/19 1500 84     Resp 01/29/19 1500 16     Temp 01/29/19 1500 99 F (37.2 C)     Temp Source 01/29/19 1500 Oral     SpO2 01/29/19 1500 99 %     Weight 01/29/19 1502 (!) 315 lb (142.9 kg)     Height 01/29/19 1502 5\' 5"  (1.651 m)     Head Circumference --      Peak Flow --      Pain Score 01/29/19 1501 8    Constitutional: Alert and oriented.  Eyes: Conjunctivae are normal.  ENT      Head: Normocephalic and atraumatic.      Nose: No congestion/rhinnorhea.      Mouth/Throat: Mucous membranes are moist.      Neck: No stridor. Hematological/Lymphatic/Immunilogical: No cervical lymphadenopathy. Cardiovascular: Normal rate, regular rhythm.  No murmurs, rubs, or gallops.  Respiratory: Occasional dry cough. Slight expiratory wheeze.  Gastrointestinal: Soft and non tender. No rebound. No guarding.  Genitourinary: Deferred Musculoskeletal: Normal range of motion in all extremities. No lower extremity edema. Neurologic:  Normal speech and language. No gross focal neurologic deficits are appreciated.  Skin:  Skin is warm, dry and intact. No rash noted. Psychiatric: Mood and affect are normal. Speech and behavior are normal. Patient exhibits appropriate insight and judgment.  ____________________________________________    LABS (pertinent positives/negatives)  CBC wbc 11.0, hgb 14.2, plt 316 Lipase 45 UA rare bacteria  otherwise wnl CMP wnl except glu 127  ____________________________________________   EKG  I, Nance Pear, attending physician, personally viewed and interpreted this EKG  EKG Time: 1507 Rate: 85 Rhythm: normal sinus rhythm Axis: normal Intervals: qtc 421 QRS: narrow ST changes: no st elevation Impression: normal ekg  ____________________________________________    RADIOLOGY  None  ____________________________________________   PROCEDURES  Procedures  ____________________________________________   INITIAL IMPRESSION / ASSESSMENT AND PLAN / ED COURSE  Pertinent labs & imaging results that were available during my care of the patient were reviewed by me and considered in my medical decision making (see chart for details).   Patient presented with signs and symptoms concerning for COVID. Patient does state she has had loss of taste. Not hypoxic. Will give patient shot of steroids here. Will swab  patient.    ____________________________________________   FINAL CLINICAL IMPRESSION(S) / ED DIAGNOSES  Final diagnoses:  Suspected Covid-19 Virus Infection     Note: This dictation was prepared with Dragon dictation. Any transcriptional errors that result from this process are unintentional     Phineas SemenGoodman, Sameera Betton, MD 01/29/19 [phone]

## 2019-01-29 NOTE — ED Notes (Signed)
Pt states has lost sense of taste

## 2019-01-29 NOTE — ED Notes (Signed)
Pt verbalized understanding of discharge instructions. NAD at this time. 

## 2019-01-29 NOTE — ED Triage Notes (Signed)
Pt states yesterday began feeling nauseous, weak, achy, and sore throat. Took allergy medicine. Today more nausea and "swimmy headed" states usually this time of year she gets bronchitis and needs steroids. Has had negative COVID test a month ago. Works for [employer].    A&O, ambulatory. No distress noted.

## 2019-01-30 LAB — SARS CORONAVIRUS 2 (TAT 6-24 HRS): SARS Coronavirus 2: NEGATIVE

## 2019-02-11 ENCOUNTER — Ambulatory Visit: Admit: 2019-02-11 | Discharge: 2019-02-12 | Payer: MEDICARE | Attending: Family | Primary: Family

## 2019-02-11 DIAGNOSIS — J4540 Moderate persistent asthma, uncomplicated: Secondary | ICD-10-CM

## 2019-02-11 DIAGNOSIS — J0110 Acute frontal sinusitis, unspecified: Secondary | ICD-10-CM

## 2019-02-11 DIAGNOSIS — R300 Dysuria: Principal | ICD-10-CM

## 2019-02-11 MED ORDER — ALBUTEROL SULFATE HFA 90 MCG/ACTUATION AEROSOL INHALER: 2 | Inhaler | Freq: Four times a day (QID) | 3 refills | 0 days | Status: AC

## 2019-02-11 MED ORDER — PREDNISONE 10 MG TABLET: tablet | 0 refills | 0 days | Status: AC

## 2019-02-11 MED ORDER — ESOMEPRAZOLE MAGNESIUM 40 MG CAPSULE,DELAYED RELEASE
ORAL_CAPSULE | Freq: Every morning | ORAL | 1 refills | 90 days | Status: CP
Start: 2019-02-11 — End: 2019-02-16

## 2019-02-11 MED ORDER — PREDNISONE 10 MG TABLET
ORAL_TABLET | ORAL | 0 refills | 0.00000 days | Status: CP
Start: 2019-02-11 — End: 2019-02-11

## 2019-02-11 MED ORDER — AZITHROMYCIN 250 MG TABLET: tablet | 0 refills | 0 days | Status: AC

## 2019-02-11 MED ORDER — AZITHROMYCIN 250 MG TABLET
ORAL_TABLET | ORAL | 0 refills | 0.00000 days | Status: CP
Start: 2019-02-11 — End: 2019-02-16

## 2019-02-11 MED ORDER — ALBUTEROL SULFATE HFA 90 MCG/ACTUATION AEROSOL INHALER
Freq: Four times a day (QID) | RESPIRATORY_TRACT | 3 refills | 0.00000 days | Status: CP | PRN
Start: 2019-02-11 — End: 2020-02-11

## 2019-02-16 ENCOUNTER — Encounter: Admit: 2019-02-16 | Discharge: 2019-02-17 | Payer: MEDICARE | Attending: Family | Primary: Family

## 2019-02-16 DIAGNOSIS — B373 Candidiasis of vulva and vagina: Secondary | ICD-10-CM

## 2019-02-16 DIAGNOSIS — E559 Vitamin D deficiency, unspecified: Secondary | ICD-10-CM

## 2019-02-16 DIAGNOSIS — R300 Dysuria: Secondary | ICD-10-CM

## 2019-02-16 DIAGNOSIS — K219 Gastro-esophageal reflux disease without esophagitis: Secondary | ICD-10-CM

## 2019-02-16 DIAGNOSIS — K581 Irritable bowel syndrome with constipation: Secondary | ICD-10-CM

## 2019-02-16 MED ORDER — FLUCONAZOLE 150 MG TABLET
ORAL_TABLET | Freq: Once | ORAL | 1 refills | 1 days | Status: CP
Start: 2019-02-16 — End: 2019-02-16

## 2019-02-16 MED ORDER — ESOMEPRAZOLE MAGNESIUM 40 MG CAPSULE,DELAYED RELEASE
ORAL_CAPSULE | Freq: Every morning | ORAL | 1 refills | 90 days | Status: CP
Start: 2019-02-16 — End: 2020-02-16

## 2019-02-16 MED ORDER — ERGOCALCIFEROL (VITAMIN D2) 1,250 MCG (50,000 UNIT) CAPSULE
ORAL_CAPSULE | ORAL | 6 refills | 28 days | Status: CP
Start: 2019-02-16 — End: 2020-02-16

## 2019-02-16 MED ORDER — DICLOFENAC SODIUM 75 MG TABLET,DELAYED RELEASE
ORAL_TABLET | Freq: Two times a day (BID) | ORAL | 3 refills | 30.00000 days | Status: CP
Start: 2019-02-16 — End: ?

## 2019-02-16 MED ORDER — LINACLOTIDE 145 MCG CAPSULE
ORAL_CAPSULE | Freq: Every day | ORAL | 3 refills | 30.00000 days | Status: CP
Start: 2019-02-16 — End: ?

## 2019-02-23 ENCOUNTER — Encounter: Admit: 2019-02-23 | Discharge: 2019-02-24 | Payer: MEDICARE

## 2019-03-02 ENCOUNTER — Encounter: Admit: 2019-03-02 | Discharge: 2019-03-02 | Payer: MEDICARE

## 2019-03-02 DIAGNOSIS — N644 Mastodynia: Secondary | ICD-10-CM

## 2019-03-02 DIAGNOSIS — N63 Unspecified lump in unspecified breast: Secondary | ICD-10-CM

## 2019-03-16 ENCOUNTER — Ambulatory Visit: Admit: 2019-03-16 | Discharge: 2019-03-17 | Payer: MEDICARE | Attending: Family | Primary: Family

## 2019-03-16 DIAGNOSIS — Z90710 Acquired absence of both cervix and uterus: Secondary | ICD-10-CM

## 2019-03-16 DIAGNOSIS — K581 Irritable bowel syndrome with constipation: Secondary | ICD-10-CM

## 2019-03-16 DIAGNOSIS — Z01419 Encounter for gynecological examination (general) (routine) without abnormal findings: Secondary | ICD-10-CM

## 2019-03-16 DIAGNOSIS — R635 Abnormal weight gain: Secondary | ICD-10-CM

## 2019-03-16 MED ORDER — FUROSEMIDE 20 MG TABLET
ORAL_TABLET | Freq: Every day | ORAL | 3 refills | 60 days | Status: CP
Start: 2019-03-16 — End: ?

## 2019-03-16 MED ORDER — MULTIVITAMIN WITH MINERALS TABLET
ORAL_TABLET | Freq: Every day | ORAL | 2 refills | 120 days | Status: CP
Start: 2019-03-16 — End: 2020-03-15

## 2019-05-06 ENCOUNTER — Emergency Department: Payer: Medicare Other

## 2019-05-06 ENCOUNTER — Emergency Department
Admission: EM | Admit: 2019-05-06 | Discharge: 2019-05-06 | Disposition: A | Discharge status: Home / Self Care | Payer: Medicare Other | Attending: Emergency Medicine | Admitting: Emergency Medicine

## 2019-05-06 ENCOUNTER — Other Ambulatory Visit: Payer: Self-pay

## 2019-05-06 ENCOUNTER — Encounter: Payer: Self-pay | Admitting: Emergency Medicine

## 2019-05-06 DIAGNOSIS — J45909 Unspecified asthma, uncomplicated: Secondary | ICD-10-CM | POA: Diagnosis not present

## 2019-05-06 DIAGNOSIS — Y939 Activity, unspecified: Secondary | ICD-10-CM | POA: Insufficient documentation

## 2019-05-06 DIAGNOSIS — Y999 Unspecified external cause status: Secondary | ICD-10-CM | POA: Diagnosis not present

## 2019-05-06 DIAGNOSIS — F17210 Nicotine dependence, cigarettes, uncomplicated: Secondary | ICD-10-CM | POA: Diagnosis not present

## 2019-05-06 DIAGNOSIS — Y929 Unspecified place or not applicable: Secondary | ICD-10-CM | POA: Insufficient documentation

## 2019-05-06 DIAGNOSIS — W0110XA Fall on same level from slipping, tripping and stumbling with subsequent striking against unspecified object, initial encounter: Secondary | ICD-10-CM | POA: Insufficient documentation

## 2019-05-06 DIAGNOSIS — R519 Headache, unspecified: Secondary | ICD-10-CM | POA: Diagnosis not present

## 2019-05-06 LAB — COMPREHENSIVE METABOLIC PANEL
ALT: 21 U/L (ref 0–44)
AST: 20 U/L (ref 15–41)
Albumin: 3.5 g/dL (ref 3.5–5.0)
Alkaline Phosphatase: 52 U/L (ref 38–126)
Anion gap: 11 (ref 5–15)
BUN: 7 mg/dL (ref 6–20)
CO2: 24 mmol/L (ref 22–32)
Calcium: 9.2 mg/dL (ref 8.9–10.3)
Chloride: 106 mmol/L (ref 98–111)
Creatinine, Ser: 0.73 mg/dL (ref 0.44–1.00)
GFR calc Af Amer: >60 mL/min (ref >60)
GFR calc non Af Amer: >60 mL/min (ref >60)
Glucose, Bld: 94 mg/dL (ref 70–99)
Potassium: 3.7 mmol/L (ref 3.5–5.1)
Sodium: 141 mmol/L (ref 135–145)
Total Bilirubin: 0.7 mg/dL (ref 0.3–1.2)
Total Protein: 6.9 g/dL (ref 6.5–8.1)

## 2019-05-06 LAB — CBC WITH DIFFERENTIAL/PLATELET
Abs Immature Granulocytes: 0.03 10*3/uL (ref 0.00–0.07)
Basophils Absolute: 0.1 10*3/uL (ref 0.0–0.1)
Basophils Relative: 1 %
Eosinophils Absolute: 0.2 10*3/uL (ref 0.0–0.5)
Eosinophils Relative: 2 %
HCT: 40.7 % (ref 36.0–46.0)
Hemoglobin: 14.5 g/dL (ref 12.0–15.0)
Immature Granulocytes: 0 %
Lymphocytes Relative: 46 %
Lymphs Abs: 4.8 10*3/uL — ABNORMAL HIGH (ref 0.7–4.0)
MCH: 31.3 pg (ref 26.0–34.0)
MCHC: 35.6 g/dL (ref 30.0–36.0)
MCV: 87.9 fL (ref 80.0–100.0)
Monocytes Absolute: 0.8 10*3/uL (ref 0.1–1.0)
Monocytes Relative: 8 %
Neutro Abs: 4.4 10*3/uL (ref 1.7–7.7)
Neutrophils Relative %: 43 %
Platelets: 295 10*3/uL (ref 150–400)
RBC: 4.63 MIL/uL (ref 3.87–5.11)
RDW: 12.5 % (ref 11.5–15.5)
WBC: 10.3 10*3/uL (ref 4.0–10.5)
nRBC: 0 % (ref 0.0–0.2)

## 2019-05-06 MED ORDER — METOCLOPRAMIDE HCL 5 MG/ML IJ SOLN
10.0000 mg | Freq: Once | INTRAMUSCULAR | Status: AC
  Administered 2019-05-06: 10 mg via INTRAVENOUS
  Filled 2019-05-06: qty 2

## 2019-05-06 MED ORDER — LORAZEPAM 2 MG/ML IJ SOLN
0.5000 mg | Freq: Once | INTRAMUSCULAR | Status: AC
  Administered 2019-05-06: 18:00:00 0.5 mg via INTRAVENOUS
  Filled 2019-05-06: qty 1

## 2019-05-06 MED ORDER — PROMETHAZINE HCL 25 MG PO TABS: 25.0000 mg | ORAL_TABLET | Freq: Four times a day (QID) | ORAL | 0 refills | Status: DC | PRN

## 2019-05-06 MED ORDER — SODIUM CHLORIDE 0.9 % IV SOLN
Freq: Once | INTRAVENOUS | Status: AC
  Administered 2019-05-06: 18:00:00 via INTRAVENOUS

## 2019-05-06 MED ORDER — KETOROLAC TROMETHAMINE 30 MG/ML IJ SOLN
30.0000 mg | Freq: Once | INTRAMUSCULAR | Status: AC
  Administered 2019-05-06: 30 mg via INTRAVENOUS
  Filled 2019-05-06: qty 1

## 2019-05-06 NOTE — ED Triage Notes (Signed)
Patient presents to the ED via EMS from home post 2 seizures today.  Patient reported to EMS that yesterday she slipped in the bathtub and hit the right side of her head.  Patient reports history of right sided brain aneurism and states she has had a severe headache since that time.  Patient reports she has history of seizures but has not had one in "a while".  Patient is alert and oriented x 4 at this time.

## 2019-05-06 NOTE — ED Notes (Signed)
PT is shaking, per pt normal after seizure

## 2019-05-06 NOTE — ED Provider Notes (Signed)
[facility] Emergency Department Provider Note       Time seen: ----------------------------------------- 5:11 PM on 05/06/2019 -----------------------------------------   I have reviewed the triage vital signs and the nursing notes.  HISTORY   Chief Complaint No chief complaint on file.    HPI Kendra Clark is a 43 y.o. female with a history of anxiety, asthma, bipolar disorder, depression, hyperlipidemia, IBS, PTSD who presents to the ED for right-sided headache.  Patient reports she fell yesterday and hit the right side of her head.  She also has a known aneurysm and is concerned this may have ruptured.  She has some type of nonepileptic seizure she states, typically takes gabapentin for this but has not really been taking it.  She denies any recent illness.  Past Medical History:  Diagnosis Date  . Anxiety   . Asthma   . Atopy   . Bipolar disorder (HCC)   . Degenerative disc disease, lumbar   . Depression   . Hiatal hernia   . History of mitral valve disease   . Hyperlipidemia   . IBS (irritable bowel syndrome)   . Osteoporosis   . PTSD (post-traumatic stress disorder)     Patient Active Problem List   Diagnosis Date Noted  . Coronary artery calcification 03/26/2015  . Morbid obesity (HCC) 03/26/2015    Past Surgical History:  Procedure Laterality Date  . ABDOMINAL HYSTERECTOMY    . ABDOMINAL HYSTERECTOMY    . CESAREAN SECTION     x 3   . GALLBLADDER SURGERY    . TONSILLECTOMY AND ADENOIDECTOMY      Allergies Penicillins and Tramadol  Social History Social History   Tobacco Use  . Smoking status: Current Every Day Smoker    Packs/day: 1.50    Years: 13.00    Pack years: 19.50    Types: Cigarettes  . Smokeless tobacco: Never Used  Substance Use Topics  . Alcohol use: No  . Drug use: Yes    Types: Marijuana   Review of Systems Constitutional: Negative for fever. Cardiovascular: Negative for chest pain. Respiratory:  Negative for shortness of breath. Gastrointestinal: Negative for abdominal pain, vomiting and diarrhea. Musculoskeletal: Negative for back pain. Skin: Negative for rash. Neurological: Positive for headache, negative for focal weakness or numbness.  Positive for possible seizure-like activity  All systems negative/normal/unremarkable except as stated in the HPI  ____________________________________________   PHYSICAL EXAM:  VITAL SIGNS: ED Triage Vitals  Enc Vitals Group     BP      Pulse      Resp      Temp      Temp src      SpO2      Weight      Height      Head Circumference      Peak Flow      Pain Score      Pain Loc      Pain Edu?      Excl. in GC?    Constitutional: Alert and oriented. Well appearing and in no distress. Eyes: Conjunctivae are normal. Normal extraocular movements. ENT      Head: Normocephalic and atraumatic.      Nose: No congestion/rhinnorhea.      Mouth/Throat: Mucous membranes are moist.      Neck: No stridor. Cardiovascular: Normal rate, regular rhythm. No murmurs, rubs, or gallops. Respiratory: Normal respiratory effort without tachypnea nor retractions. Breath sounds are clear and equal bilaterally. No wheezes/rales/rhonchi. Gastrointestinal:  Soft and nontender. Normal bowel sounds Musculoskeletal: Nontender with normal range of motion in extremities. No lower extremity tenderness nor edema. Neurologic:  Normal speech and language. No gross focal neurologic deficits are appreciated.  Skin:  Skin is warm, dry and intact. No rash noted. Psychiatric: Depressed mood ____________________________________________  ED COURSE:  As part of my medical decision making, I reviewed the following data within the [place] History obtained from family if available, nursing notes, old chart and ekg, as well as notes from prior ED visits. Patient presented for headache, recent head injury and possible seizure-like activity, we will assess  with labs and imaging as indicated at this time.   Procedures  Kendra Clark was evaluated in Emergency Department on 05/06/2019 for the symptoms described in the history of present illness. She was evaluated in the context of the global COVID-19 pandemic, which necessitated consideration that the patient might be at risk for infection with the SARS-CoV-2 virus that causes COVID-19. Institutional protocols and algorithms that pertain to the evaluation of patients at risk for COVID-19 are in a state of rapid change based on information released by regulatory bodies including the CDC and federal and state organizations. These policies and algorithms were followed during the patient's care in the ED.  ____________________________________________   LABS (pertinent positives/negatives)  Labs Reviewed  CBC WITH DIFFERENTIAL/PLATELET - Abnormal; Notable for the following components:      Result Value   Lymphs Abs 4.8 (*)    All other components within normal limits  COMPREHENSIVE METABOLIC PANEL  URINALYSIS, COMPLETE (UACMP) WITH MICROSCOPIC    RADIOLOGY Images were viewed by me  CT head  IMPRESSION:  Normal head CT.  ____________________________________________   DIFFERENTIAL DIAGNOSIS   Depression, anxiety, pseudoseizure, subdural, subarachnoid, contusion, tension headache, migraine  FINAL ASSESSMENT AND PLAN  Headache   Plan: The patient had presented for headache after recent head injury. Patient's labs were unremarkable. Patient's imaging did not reveal any acute process.  She is feeling relief from her headache.  She is cleared for outpatient follow-up.   Laurence Aly, MD    Note: This note was generated in part or whole with voice recognition software. Voice recognition is usually quite accurate but there are transcription errors that can and very often do occur. I apologize for any typographical errors that were not detected and corrected.     Earleen Newport, MD 05/06/19 1950

## 2019-05-26 ENCOUNTER — Encounter: Admit: 2019-05-26 | Discharge: 2019-05-27 | Payer: MEDICARE

## 2019-06-27 DIAGNOSIS — B373 Candidiasis of vulva and vagina: Principal | ICD-10-CM

## 2019-06-27 MED ORDER — FLUCONAZOLE 150 MG TABLET
ORAL_TABLET | Freq: Once | ORAL | 1 refills | 1 days | Status: CP
Start: 2019-06-27 — End: 2019-06-27

## 2019-07-01 ENCOUNTER — Encounter: Admit: 2019-07-01 | Discharge: 2019-07-02 | Payer: MEDICARE | Attending: Family | Primary: Family

## 2019-07-01 DIAGNOSIS — F419 Anxiety disorder, unspecified: Principal | ICD-10-CM

## 2019-07-01 DIAGNOSIS — R5383 Other fatigue: Principal | ICD-10-CM

## 2019-07-01 DIAGNOSIS — R300 Dysuria: Principal | ICD-10-CM

## 2019-07-01 DIAGNOSIS — J0100 Acute maxillary sinusitis, unspecified: Principal | ICD-10-CM

## 2019-07-01 MED ORDER — FLUTICASONE PROPIONATE 50 MCG/ACTUATION NASAL SPRAY,SUSPENSION
Freq: Every day | NASAL | 3 refills | 0 days | Status: CP
Start: 2019-07-01 — End: 2020-06-30

## 2019-07-01 MED ORDER — AZITHROMYCIN 250 MG TABLET
ORAL_TABLET | 0 refills | 0 days | Status: CP
Start: 2019-07-01 — End: ?

## 2019-08-25 ENCOUNTER — Encounter: Admit: 2019-08-25 | Discharge: 2019-08-26 | Payer: MEDICARE | Attending: Family | Primary: Family

## 2019-08-25 DIAGNOSIS — F419 Anxiety disorder, unspecified: Principal | ICD-10-CM

## 2019-08-25 DIAGNOSIS — K219 Gastro-esophageal reflux disease without esophagitis: Principal | ICD-10-CM

## 2019-08-25 DIAGNOSIS — R0789 Other chest pain: Principal | ICD-10-CM

## 2019-08-25 DIAGNOSIS — K581 Irritable bowel syndrome with constipation: Principal | ICD-10-CM

## 2019-08-25 MED ORDER — POLYETHYLENE GLYCOL 3350 17 GRAM ORAL POWDER PACKET
PACK | Freq: Two times a day (BID) | ORAL | 3 refills | 5 days | Status: CP
Start: 2019-08-25 — End: 2019-08-30

## 2019-08-25 MED ORDER — PROPRANOLOL 10 MG TABLET
ORAL_TABLET | Freq: Two times a day (BID) | ORAL | 3 refills | 15.00000 days | Status: CP
Start: 2019-08-25 — End: 2020-08-24

## 2019-08-25 MED ORDER — FLUCONAZOLE 150 MG TABLET
ORAL_TABLET | Freq: Once | ORAL | 0 refills | 1 days | Status: CP
Start: 2019-08-25 — End: 2019-08-25

## 2019-08-25 MED ORDER — SPIRIVA WITH HANDIHALER 18 MCG AND INHALATION CAPSULES
ORAL_CAPSULE | Freq: Every day | RESPIRATORY_TRACT | 3 refills | 30 days | Status: CP
Start: 2019-08-25 — End: ?

## 2019-09-01 MED ORDER — PROPRANOLOL 10 MG TABLET
ORAL_TABLET | Freq: Two times a day (BID) | ORAL | 3 refills | 15.00000 days | Status: CP
Start: 2019-09-01 — End: 2020-08-31

## 2019-09-19 MED ORDER — PROPRANOLOL 10 MG TABLET
ORAL_TABLET | Freq: Two times a day (BID) | ORAL | 1 refills | 90 days | Status: CP
Start: 2019-09-19 — End: 2020-09-18

## 2019-09-26 DIAGNOSIS — R635 Abnormal weight gain: Principal | ICD-10-CM

## 2019-09-27 MED ORDER — FUROSEMIDE 20 MG TABLET
ORAL_TABLET | Freq: Two times a day (BID) | ORAL | 0 refills | 90.00000 days | Status: CP
Start: 2019-09-27 — End: 2020-09-26

## 2019-09-30 ENCOUNTER — Encounter: Admit: 2019-09-30 | Discharge: 2019-09-30 | Payer: MEDICARE | Attending: Family | Primary: Family

## 2019-09-30 DIAGNOSIS — J40 Bronchitis, not specified as acute or chronic: Principal | ICD-10-CM

## 2019-09-30 DIAGNOSIS — J0100 Acute maxillary sinusitis, unspecified: Principal | ICD-10-CM

## 2019-09-30 MED ORDER — DOXYCYCLINE HYCLATE 100 MG CAPSULE
ORAL_CAPSULE | Freq: Two times a day (BID) | ORAL | 0 refills | 10 days | Status: CP
Start: 2019-09-30 — End: 2019-10-10

## 2019-09-30 MED ORDER — PREDNISONE 20 MG TABLET
ORAL_TABLET | 0 refills | 0 days | Status: CP
Start: 2019-09-30 — End: ?

## 2019-09-30 MED ORDER — ALBUTEROL SULFATE 2.5 MG/3 ML (0.083 %) SOLUTION FOR NEBULIZATION
Freq: Four times a day (QID) | RESPIRATORY_TRACT | 3 refills | 9.00000 days | Status: CP
Start: 2019-09-30 — End: ?

## 2019-10-12 ENCOUNTER — Emergency Department
Admission: EM | Admit: 2019-10-12 | Discharge: 2019-10-12 | Disposition: A | Discharge status: Home / Self Care | Payer: Medicare Other | Attending: Emergency Medicine | Admitting: Emergency Medicine

## 2019-10-12 ENCOUNTER — Other Ambulatory Visit: Payer: Self-pay

## 2019-10-12 DIAGNOSIS — F1210 Cannabis abuse, uncomplicated: Secondary | ICD-10-CM | POA: Diagnosis not present

## 2019-10-12 DIAGNOSIS — F17210 Nicotine dependence, cigarettes, uncomplicated: Secondary | ICD-10-CM | POA: Insufficient documentation

## 2019-10-12 DIAGNOSIS — M5432 Sciatica, left side: Secondary | ICD-10-CM | POA: Diagnosis not present

## 2019-10-12 DIAGNOSIS — M5431 Sciatica, right side: Secondary | ICD-10-CM | POA: Diagnosis not present

## 2019-10-12 DIAGNOSIS — Z79899 Other long term (current) drug therapy: Secondary | ICD-10-CM | POA: Diagnosis not present

## 2019-10-12 DIAGNOSIS — M62838 Other muscle spasm: Secondary | ICD-10-CM

## 2019-10-12 DIAGNOSIS — M549 Dorsalgia, unspecified: Secondary | ICD-10-CM | POA: Diagnosis present

## 2019-10-12 LAB — COMPREHENSIVE METABOLIC PANEL
ALT: 23 U/L (ref 0–44)
AST: 19 U/L (ref 15–41)
Albumin: 3.8 g/dL (ref 3.5–5.0)
Alkaline Phosphatase: 52 U/L (ref 38–126)
Anion gap: 10 (ref 5–15)
BUN: 10 mg/dL (ref 6–20)
CO2: 26 mmol/L (ref 22–32)
Calcium: 9.3 mg/dL (ref 8.9–10.3)
Chloride: 104 mmol/L (ref 98–111)
Creatinine, Ser: 0.73 mg/dL (ref 0.44–1.00)
GFR calc Af Amer: >60 mL/min (ref >60)
GFR calc non Af Amer: >60 mL/min (ref >60)
Glucose, Bld: 149 mg/dL — ABNORMAL HIGH (ref 70–99)
Potassium: 4.1 mmol/L (ref 3.5–5.1)
Sodium: 140 mmol/L (ref 135–145)
Total Bilirubin: 0.6 mg/dL (ref 0.3–1.2)
Total Protein: 7.2 g/dL (ref 6.5–8.1)

## 2019-10-12 LAB — GLUCOSE, CAPILLARY: Glucose-Capillary: 149 mg/dL — ABNORMAL HIGH (ref 70–99)

## 2019-10-12 LAB — POCT PREGNANCY, URINE: Preg Test, Ur: NEGATIVE

## 2019-10-12 LAB — URINALYSIS, COMPLETE (UACMP) WITH MICROSCOPIC
Bilirubin Urine: NEGATIVE
Glucose, UA: NEGATIVE mg/dL
Hgb urine dipstick: NEGATIVE
Ketones, ur: NEGATIVE mg/dL
Leukocytes,Ua: NEGATIVE
Nitrite: NEGATIVE
Protein, ur: NEGATIVE mg/dL
Specific Gravity, Urine: 1.02 (ref 1.005–1.030)
pH: 5 (ref 5.0–8.0)

## 2019-10-12 LAB — CBC
HCT: 44.2 % (ref 36.0–46.0)
Hemoglobin: 14.9 g/dL (ref 12.0–15.0)
MCH: 31.4 pg (ref 26.0–34.0)
MCHC: 33.7 g/dL (ref 30.0–36.0)
MCV: 93.1 fL (ref 80.0–100.0)
Platelets: 316 10*3/uL (ref 150–400)
RBC: 4.75 MIL/uL (ref 3.87–5.11)
RDW: 12.7 % (ref 11.5–15.5)
WBC: 11.7 10*3/uL — ABNORMAL HIGH (ref 4.0–10.5)
nRBC: 0 % (ref 0.0–0.2)

## 2019-10-12 LAB — CK: Total CK: 92 U/L (ref 38–234)

## 2019-10-12 LAB — LIPASE, BLOOD: Lipase: 36 U/L (ref 11–51)

## 2019-10-12 MED ORDER — KETOROLAC TROMETHAMINE 30 MG/ML IJ SOLN
30.0000 mg | Freq: Once | INTRAMUSCULAR | Status: AC
  Administered 2019-10-12: 16:00:00 30 mg via INTRAMUSCULAR
  Filled 2019-10-12: qty 1

## 2019-10-12 MED ORDER — SODIUM CHLORIDE 0.9% FLUSH: 3.0000 mL | Freq: Once | INTRAVENOUS | Status: DC

## 2019-10-12 MED ORDER — IBUPROFEN 600 MG PO TABS: 600.0000 mg | ORAL_TABLET | Freq: Three times a day (TID) | ORAL | 0 refills | Status: AC | PRN

## 2019-10-12 MED ORDER — CYCLOBENZAPRINE HCL 5 MG PO TABS: 5.0000 mg | ORAL_TABLET | Freq: Three times a day (TID) | ORAL | 0 refills | Status: DC | PRN

## 2019-10-12 MED ORDER — ACETAMINOPHEN 500 MG PO TABS
1000.0000 mg | ORAL_TABLET | Freq: Once | ORAL | Status: AC
  Administered 2019-10-12: 1000 mg via ORAL
  Filled 2019-10-12: qty 2

## 2019-10-12 NOTE — ED Triage Notes (Addendum)
Pt comes via POV from home with generalized body aches and pain down her legs.  Pt states she just feels nauseated. Pt states cramps all over. Pt states she is prediabetic and hasn't been able to check her sugar recently.  [id number]

## 2019-10-12 NOTE — Discharge Instructions (Addendum)
Your work-up was reassuring.  Take Tylenol 1 g every 8 hours and use the ibuprofen every 8 hours with food.  Take the Flexeril for the muscle spasms.  Do not drive or operate machinery while on this.  Follow-up with your primary care doctor for further testing if your symptoms are not getting better.

## 2019-10-12 NOTE — ED Notes (Signed)
Pt states that she has a hx of sciatica pain and that her neck and back are hurting. Pt states she was tested for covid and it came back neg. Pt walked to bathroom with NAD noted, tolerated well.

## 2019-10-12 NOTE — ED Provider Notes (Signed)
[facility] Emergency Department Provider Note  ____________________________________________   First MD Initiated Contact with Patient 10/12/19 1536     (approximate)  I have reviewed the triage vital signs and the nursing notes.   HISTORY  Chief Complaint Back Pain and Abdominal Pain    HPI Kendra Clark is a 44 y.o. female with bipolar, degenerative disc disease, depression, degenerative disc disease with known sciatica who comes in with muscle aches.  Patient states that since Saturday she has had body aches, pain to her back, legs, neck.  States she is already been tested for Covid and that was negative.  Patient states her aches are moderate, intermittent, nothing makes it better, nothing makes them worse.  She states that she was worried that her electrolytes were out of balance.  She also reports some tingling pain down the back of both of her legs.  States that she has a history of sciatica and this is typical for her.  Denies any new leg weakness still able to ambulate.  No numbness in the buttock region.  Sensation intact in her lower extremity, no urinating on herself.  For the abdominal pain patient reports that is just the same muscle cramps are all over and also in her abdomen.  She states that she has a history of IBS and was recently on steroids and thinks that that flared up her IBS.  She denies any vomiting or fevers.          Past Medical History:  Diagnosis Date  . Anxiety   . Asthma   . Atopy   . Bipolar disorder ([place])   . Degenerative disc disease, lumbar   . Depression   . Hiatal hernia   . History of mitral valve disease   . Hyperlipidemia   . IBS (irritable bowel syndrome)   . Osteoporosis   . PTSD (post-traumatic stress disorder)     Patient Active Problem List   Diagnosis Date Noted  . Coronary artery calcification 03/26/2015  . Morbid obesity ([place]) 03/26/2015    Past Surgical History:  Procedure Laterality Date   . ABDOMINAL HYSTERECTOMY    . ABDOMINAL HYSTERECTOMY    . CESAREAN SECTION     x 3   . GALLBLADDER SURGERY    . TONSILLECTOMY AND ADENOIDECTOMY      Prior to Admission medications   Medication Sig Start Date End Date Taking? Authorizing Provider  azelastine (OPTIVAR) 0.05 % ophthalmic solution INSTILL 1 DROP INTO BOTH EYES TWICE A DAY FOR 10 DAYS AS NEEDED 03/03/18   [provider]  cyclobenzaprine (FLEXERIL) 5 MG tablet Take 1-2 tablets 3 times daily as needed 03/09/18   Laban Emperor, PA-C  diclofenac (VOLTAREN) 75 MG EC tablet Take 75 mg by mouth 2 (two) times daily.    [provider]  esomeprazole (NEXIUM) 40 MG capsule Take by mouth. 06/26/15   [provider]  furosemide (LASIX) 20 MG tablet  08/26/18   [provider]  hydrOXYzine (VISTARIL) 25 MG capsule TAKE 1 CAPSULE BY MOUTH 2 (TWO) TIMES DAILY AS NEEDED. FOR SEVERE ANXIETY ATTACKS ONLY, SLEEP 11/01/18   Ursula Alert, MD  ibuprofen (ADVIL) 600 MG tablet Take 1 tablet (600 mg total) by mouth every 8 (eight) hours as needed. 12/24/18   Sable Feil, PA-C  ibuprofen (ADVIL,MOTRIN) 600 MG tablet Take 1 tablet (600 mg total) by mouth every 8 (eight) hours as needed. 02/09/18   Darel Hong, MD  lidocaine (XYLOCAINE) 2 %  solution Use as directed 5 mLs in the mouth or throat every 6 (six) hours as needed for mouth pain. 12/24/18   [name], PA-C  lubiprostone (AMITIZA) 24 MCG capsule Take by mouth. 04/06/08   [provider]  promethazine (PHENERGAN) 25 MG tablet Take 1 tablet (25 mg total) by mouth every 6 (six) hours as needed for nausea or vomiting. 05/06/19   Emily Filbert, MD  umeclidinium bromide (INCRUSE ELLIPTA) 62.5 MCG/INH AEPB  09/13/18   [provider]  ziprasidone (GEODON) 40 MG capsule TAKE 1 CAPSULE (40 MG TOTAL) BY MOUTH DAILY WITH SUPPER. MOOD 11/01/18   Jomarie Longs, MD    Allergies Penicillins and Tramadol  Family History  Problem Relation  Age of Onset  . Hypertension Father   . Hyperlipidemia Father   . Hypertension Mother   . Hyperlipidemia Mother   . Arrhythmia Mother        A-fib  . Breast cancer Mother 36  . Breast cancer Paternal Aunt 27  . Breast cancer Paternal Grandmother 41    Social History Social History   Tobacco Use  . Smoking status: Current Every Day Smoker    Packs/day: 1.50    Years: 13.00    Pack years: 19.50    Types: Cigarettes  . Smokeless tobacco: Never Used  Substance Use Topics  . Alcohol use: No  . Drug use: Yes    Types: Marijuana      Review of Systems Constitutional: No fever/chills Eyes: No visual changes. ENT: No sore throat. Cardiovascular: Denies chest pain. Respiratory: Denies shortness of breath. Gastrointestinal: Positive abdominal pain no nausea, no vomiting.  No diarrhea.  No constipation. Genitourinary: Negative for dysuria. Musculoskeletal: Positive back pain.  Muscle aches Skin: Negative for rash. Neurological: Negative for headaches, focal weakness or numbness. All other ROS negative ____________________________________________   PHYSICAL EXAM:  VITAL SIGNS: ED Triage Vitals  Enc Vitals Group     BP 10/12/19 1442 132/87     Pulse Rate 10/12/19 1442 84     Resp 10/12/19 1442 18     Temp 10/12/19 1442 (!) 97 F (36.1 C)     Temp src --      SpO2 10/12/19 1442 100 %     Weight --      Height --      Head Circumference --      Peak Flow --      Pain Score 10/12/19 1440 8     Pain Loc --      Pain Edu? --      Excl. in GC? --     Constitutional: Alert and oriented. Well appearing and in no acute distress. Eyes: Conjunctivae are normal. EOMI. Head: Atraumatic. Nose: No congestion/rhinnorhea. Mouth/Throat: Mucous membranes are moist.   Neck: No stridor. Trachea Midline. FROM Cardiovascular: Normal rate, regular rhythm. Grossly normal heart sounds.  Good peripheral circulation. Respiratory: Normal respiratory effort.  No retractions. Lungs  CTAB. Gastrointestinal: Soft and nontender. No distention. No abdominal bruits.  Musculoskeletal: No lower extremity tenderness nor edema.  No joint effusions. Neurologic:  Normal speech and language. No gross focal neurologic deficits are appreciated.  Skin:  Skin is warm, dry and intact. No rash noted. Psychiatric: Mood and affect are normal. Speech and behavior are normal. GU: Deferred   ____________________________________________   LABS (all labs ordered are listed, but only abnormal results are displayed)  Labs Reviewed  COMPREHENSIVE METABOLIC PANEL - Abnormal; Notable for the following components:  Result Value   Glucose, Bld 149 (*)    All other components within normal limits  CBC - Abnormal; Notable for the following components:   WBC 11.7 (*)    All other components within normal limits  URINALYSIS, COMPLETE (UACMP) WITH MICROSCOPIC - Abnormal; Notable for the following components:   Color, Urine YELLOW (*)    APPearance HAZY (*)    Bacteria, UA RARE (*)    All other components within normal limits  GLUCOSE, CAPILLARY - Abnormal; Notable for the following components:   Glucose-Capillary 149 (*)    All other components within normal limits  LIPASE, BLOOD  CK  POC URINE PREG, ED  POCT PREGNANCY, URINE   ____________________________________________   ED ECG REPORT I, Concha Se, the attending physician, personally viewed and interpreted this ECG.  EKG is normal sinus rate of 64, no ST elevations, T wave inversion in V2 and V3, normal intervals.  This looks similar to prior EKG. ____________________________________________   ____________________________________________   PROCEDURES  Procedure(s) performed (including Critical Care):  Procedures   ____________________________________________   INITIAL IMPRESSION / ASSESSMENT AND PLAN / ED COURSE  Enyla A Furney was evaluated in Emergency Department on 10/12/2019 for the symptoms described in the  history of present illness. She was evaluated in the context of the global COVID-19 pandemic, which necessitated consideration that the patient might be at risk for infection with the SARS-CoV-2 virus that causes COVID-19. Institutional protocols and algorithms that pertain to the evaluation of patients at risk for COVID-19 are in a state of rapid change based on information released by regulatory bodies including the CDC and federal and state organizations. These policies and algorithms were followed during the patient's care in the ED.    Patient is a 44 year old who comes in with muscle spasms throughout her body.  Will get labs to evaluate for Electra abnormalities, AKI.  Patient already recently tested negative for Covid.  Will get CK level to evaluate for rhabdo.  Patient not on a statin though.  Discussed CT imaging if her abdomen was more tender than normal but patient states that this is her typical tenderness from her IBS and she declined.  Patient stated that in the past with her sciatica muscle relaxant has been very helpful.  Patient has no evidence of cord compression and this is her typical sciatica.  Will give patient dose of Toradol and Tylenol and reassess patient's pain.  Labs are reassuring.  No significant electrolyte abnormalities or AKI White count slightly elevated 11.7 but no other infectious symptoms CK level negative  Reevaluated patient she states that she is feeling better after the Tylenol and Toradol.  Patient willing to trial a prescription of Flexeril and to follow-up with her primary care doctor for further testing of her muscle spasms are not improving.  We discussed holding off on steroids given there is no swelling of her joints and she was just recently on steroids for an asthma flare.  I also do not see any swelling of one leg to suggest DVT and the cramping is all over her body.  Patient understands she can follow-up with her primary care doctor if her symptoms or not  getting better.  Discussed with patient not driving while on Flexeril  I discussed the provisional nature of ED diagnosis, the treatment so far, the ongoing plan of care, follow up appointments and return precautions with the patient and any family or support people present. They expressed understanding and agreed with  the plan, discharged home.         ____________________________________________   FINAL CLINICAL IMPRESSION(S) / ED DIAGNOSES   Final diagnoses:  Muscle spasm  Bilateral sciatica      MEDICATIONS GIVEN DURING THIS VISIT:  Medications  sodium chloride flush (NS) 0.9 % injection 3 mL (3 mLs Intravenous Not Given 10/12/19 1535)  acetaminophen (TYLENOL) tablet 1,000 mg (1,000 mg Oral Given 10/12/19 1600)  ketorolac (TORADOL) 30 MG/ML injection 30 mg (30 mg Intramuscular Given 10/12/19 1601)     ED Discharge Orders         Ordered    ibuprofen (ADVIL) 600 MG tablet  Every 8 hours PRN     10/12/19 1732    cyclobenzaprine (FLEXERIL) 5 MG tablet  3 times daily PRN     10/12/19 1732           Note:  This document was prepared using Dragon voice recognition software and may include unintentional dictation errors.   Concha Se, MD 10/12/19 [phone]

## 2019-10-12 NOTE — ED Triage Notes (Signed)
First Nurse Notes:  C/O body aches, back, legs, neck pain since Saturday.  Arrives today c/o dizziness as well.  States had a negative COVID test on Friday.     Patient is AAOx3.  Skin warm and dry.  MAE equally and strong.  Ambulates with easy and steady gait.

## 2019-10-24 ENCOUNTER — Other Ambulatory Visit: Payer: Self-pay

## 2019-10-24 ENCOUNTER — Encounter: Payer: Self-pay | Admitting: *Deleted

## 2019-10-24 DIAGNOSIS — Z5321 Procedure and treatment not carried out due to patient leaving prior to being seen by health care provider: Secondary | ICD-10-CM | POA: Diagnosis not present

## 2019-10-24 DIAGNOSIS — R531 Weakness: Secondary | ICD-10-CM | POA: Insufficient documentation

## 2019-10-24 DIAGNOSIS — R1110 Vomiting, unspecified: Secondary | ICD-10-CM | POA: Insufficient documentation

## 2019-10-24 DIAGNOSIS — R197 Diarrhea, unspecified: Secondary | ICD-10-CM | POA: Diagnosis present

## 2019-10-24 DIAGNOSIS — R109 Unspecified abdominal pain: Secondary | ICD-10-CM | POA: Insufficient documentation

## 2019-10-24 LAB — URINALYSIS, COMPLETE (UACMP) WITH MICROSCOPIC
Bilirubin Urine: NEGATIVE
Glucose, UA: NEGATIVE mg/dL
Hgb urine dipstick: NEGATIVE
Ketones, ur: NEGATIVE mg/dL
Leukocytes,Ua: NEGATIVE
Nitrite: NEGATIVE
Protein, ur: NEGATIVE mg/dL
Specific Gravity, Urine: 1.027 (ref 1.005–1.030)
pH: 5 (ref 5.0–8.0)

## 2019-10-24 LAB — COMPREHENSIVE METABOLIC PANEL
ALT: 23 U/L (ref 0–44)
AST: 20 U/L (ref 15–41)
Albumin: 3.9 g/dL (ref 3.5–5.0)
Alkaline Phosphatase: 53 U/L (ref 38–126)
Anion gap: 9 (ref 5–15)
BUN: 16 mg/dL (ref 6–20)
CO2: 22 mmol/L (ref 22–32)
Calcium: 9.1 mg/dL (ref 8.9–10.3)
Chloride: 109 mmol/L (ref 98–111)
Creatinine, Ser: 0.84 mg/dL (ref 0.44–1.00)
GFR calc Af Amer: >60 mL/min (ref >60)
GFR calc non Af Amer: >60 mL/min (ref >60)
Glucose, Bld: 112 mg/dL — ABNORMAL HIGH (ref 70–99)
Potassium: 3.7 mmol/L (ref 3.5–5.1)
Sodium: 140 mmol/L (ref 135–145)
Total Bilirubin: 0.7 mg/dL (ref 0.3–1.2)
Total Protein: 7.3 g/dL (ref 6.5–8.1)

## 2019-10-24 LAB — CBC
HCT: 44.9 % (ref 36.0–46.0)
Hemoglobin: 15.4 g/dL — ABNORMAL HIGH (ref 12.0–15.0)
MCH: 31.5 pg (ref 26.0–34.0)
MCHC: 34.3 g/dL (ref 30.0–36.0)
MCV: 91.8 fL (ref 80.0–100.0)
Platelets: 326 10*3/uL (ref 150–400)
RBC: 4.89 MIL/uL (ref 3.87–5.11)
RDW: 12.2 % (ref 11.5–15.5)
WBC: 9.9 10*3/uL (ref 4.0–10.5)
nRBC: 0 % (ref 0.0–0.2)

## 2019-10-24 LAB — LIPASE, BLOOD: Lipase: 40 U/L (ref 11–51)

## 2019-10-24 MED ORDER — SODIUM CHLORIDE 0.9% FLUSH: 3.0000 mL | Freq: Once | INTRAVENOUS | Status: DC

## 2019-10-24 NOTE — ED Triage Notes (Signed)
Pt reports diarrhea and vomiting for 1 day.  Pt has abd pain .  Pt states her stomach hurts all over.   Pt reports feeling weak.   Pt alert  Speech clear.

## 2019-10-25 ENCOUNTER — Telehealth: Payer: Self-pay | Admitting: Emergency Medicine

## 2019-10-25 ENCOUNTER — Emergency Department
Admission: EM | Admit: 2019-10-25 | Discharge: 2019-10-25 | Disposition: A | Discharge status: Home / Self Care | Payer: Medicare Other | Attending: Emergency Medicine | Admitting: Emergency Medicine

## 2019-10-25 NOTE — Telephone Encounter (Signed)
Called patient due to lwot to inquire about condition and follow up plans. She says she is still hurting.  She has called her pcp at [facility] and they are going to review the labs.  She will return as needed.

## 2019-10-25 NOTE — ED Notes (Signed)
No answer when called several times from lobby 

## 2019-11-07 ENCOUNTER — Encounter: Admit: 2019-11-07 | Payer: MEDICARE

## 2019-11-22 ENCOUNTER — Encounter: Admit: 2019-11-22 | Discharge: 2019-11-23 | Payer: MEDICARE | Attending: Family | Primary: Family

## 2019-11-22 DIAGNOSIS — N3090 Cystitis, unspecified without hematuria: Principal | ICD-10-CM

## 2019-11-22 DIAGNOSIS — R350 Frequency of micturition: Principal | ICD-10-CM

## 2019-11-22 MED ORDER — SULFAMETHOXAZOLE 800 MG-TRIMETHOPRIM 160 MG TABLET
ORAL_TABLET | Freq: Two times a day (BID) | ORAL | 0 refills | 5 days | Status: CP
Start: 2019-11-22 — End: 2019-11-27

## 2019-11-22 MED ORDER — CLOTRIMAZOLE-BETAMETHASONE 1 %-0.05 % TOPICAL CREAM
Freq: Two times a day (BID) | TOPICAL | 0 refills | 0.00000 days | Status: CP
Start: 2019-11-22 — End: 2020-11-21

## 2019-12-08 ENCOUNTER — Emergency Department
Admission: EM | Admit: 2019-12-08 | Discharge: 2019-12-08 | Disposition: A | Discharge status: Home / Self Care | Payer: Medicare Other | Attending: Emergency Medicine | Admitting: Emergency Medicine

## 2019-12-08 ENCOUNTER — Encounter: Payer: Self-pay | Admitting: Emergency Medicine

## 2019-12-08 ENCOUNTER — Encounter: Admit: 2019-12-08 | Discharge: 2019-12-09 | Disposition: A | Discharge status: Home / Self Care | Payer: MEDICARE

## 2019-12-08 ENCOUNTER — Emergency Department: Admit: 2019-12-08 | Discharge: 2019-12-09 | Disposition: A | Discharge status: Home / Self Care | Payer: MEDICARE

## 2019-12-08 ENCOUNTER — Other Ambulatory Visit: Payer: Self-pay

## 2019-12-08 DIAGNOSIS — R1032 Left lower quadrant pain: Secondary | ICD-10-CM | POA: Diagnosis not present

## 2019-12-08 DIAGNOSIS — Z5321 Procedure and treatment not carried out due to patient leaving prior to being seen by health care provider: Secondary | ICD-10-CM | POA: Insufficient documentation

## 2019-12-08 LAB — URINALYSIS, COMPLETE (UACMP) WITH MICROSCOPIC
Bacteria, UA: NONE SEEN
Bilirubin Urine: NEGATIVE
Glucose, UA: NEGATIVE mg/dL
Hgb urine dipstick: NEGATIVE
Ketones, ur: NEGATIVE mg/dL
Leukocytes,Ua: NEGATIVE
Nitrite: NEGATIVE
Protein, ur: NEGATIVE mg/dL
Specific Gravity, Urine: 1.009 (ref 1.005–1.030)
pH: 7 (ref 5.0–8.0)

## 2019-12-08 LAB — CBC
HCT: 43.9 % (ref 36.0–46.0)
Hemoglobin: 14.9 g/dL (ref 12.0–15.0)
MCH: 31.2 pg (ref 26.0–34.0)
MCHC: 33.9 g/dL (ref 30.0–36.0)
MCV: 92 fL (ref 80.0–100.0)
Platelets: 315 10*3/uL (ref 150–400)
RBC: 4.77 MIL/uL (ref 3.87–5.11)
RDW: 12.2 % (ref 11.5–15.5)
WBC: 7.6 10*3/uL (ref 4.0–10.5)
nRBC: 0 % (ref 0.0–0.2)

## 2019-12-08 LAB — COMPREHENSIVE METABOLIC PANEL
ALT: 17 U/L (ref 0–44)
AST: 18 U/L (ref 15–41)
Albumin: 3.9 g/dL (ref 3.5–5.0)
Alkaline Phosphatase: 49 U/L (ref 38–126)
Anion gap: 10 (ref 5–15)
BUN: 11 mg/dL (ref 6–20)
CO2: 25 mmol/L (ref 22–32)
Calcium: 9.1 mg/dL (ref 8.9–10.3)
Chloride: 106 mmol/L (ref 98–111)
Creatinine, Ser: 0.82 mg/dL (ref 0.44–1.00)
GFR calc Af Amer: >60 mL/min (ref >60)
GFR calc non Af Amer: >60 mL/min (ref >60)
Glucose, Bld: 90 mg/dL (ref 70–99)
Potassium: 4 mmol/L (ref 3.5–5.1)
Sodium: 141 mmol/L (ref 135–145)
Total Bilirubin: 0.7 mg/dL (ref 0.3–1.2)
Total Protein: 7.4 g/dL (ref 6.5–8.1)

## 2019-12-08 LAB — LIPASE, BLOOD: Lipase: 39 U/L (ref 11–51)

## 2019-12-08 MED ORDER — SODIUM CHLORIDE 0.9% FLUSH: 3.0000 mL | Freq: Once | INTRAVENOUS | Status: DC

## 2019-12-08 MED ORDER — POLYETHYLENE GLYCOL 3350 17 GRAM ORAL POWDER PACKET
PACK | 0 refills | 0 days | Status: CP
Start: 2019-12-08 — End: ?

## 2019-12-08 MED ORDER — DICYCLOMINE 20 MG TABLET
ORAL_TABLET | Freq: Four times a day (QID) | ORAL | 0 refills | 5.00000 days | Status: CP | PRN
Start: 2019-12-08 — End: 2020-01-07

## 2019-12-08 NOTE — ED Triage Notes (Signed)
C/O LLQ abdominal pain x 5 days.  Patient states she has been seen twice through ED for same recently.

## 2019-12-22 DIAGNOSIS — R635 Abnormal weight gain: Principal | ICD-10-CM

## 2019-12-22 MED ORDER — FUROSEMIDE 20 MG TABLET
ORAL_TABLET | Freq: Two times a day (BID) | ORAL | 1 refills | 90 days | Status: CP
Start: 2019-12-22 — End: 2020-12-21

## 2019-12-22 MED ORDER — SPIRIVA WITH HANDIHALER 18 MCG AND INHALATION CAPSULES
ORAL_CAPSULE | Freq: Every day | RESPIRATORY_TRACT | 1 refills | 90 days | Status: CP
Start: 2019-12-22 — End: 2020-12-21

## 2019-12-27 DIAGNOSIS — E559 Vitamin D deficiency, unspecified: Principal | ICD-10-CM

## 2019-12-29 MED ORDER — ERGOCALCIFEROL (VITAMIN D2) 1,250 MCG (50,000 UNIT) CAPSULE
ORAL_CAPSULE | ORAL | 2 refills | 84 days | Status: CP
Start: 2019-12-29 — End: ?

## 2020-01-02 DIAGNOSIS — K581 Irritable bowel syndrome with constipation: Principal | ICD-10-CM

## 2020-01-02 MED ORDER — LINZESS 145 MCG CAPSULE
ORAL_CAPSULE | Freq: Every day | ORAL | 1 refills | 90 days | Status: CP
Start: 2020-01-02 — End: 2021-01-01

## 2020-02-02 ENCOUNTER — Ambulatory Visit: Admit: 2020-02-02 | Discharge: 2020-02-03 | Payer: MEDICARE

## 2020-02-02 DIAGNOSIS — R05 Cough: Principal | ICD-10-CM

## 2020-02-02 DIAGNOSIS — J4540 Moderate persistent asthma, uncomplicated: Principal | ICD-10-CM

## 2020-02-02 DIAGNOSIS — R062 Wheezing: Principal | ICD-10-CM

## 2020-02-02 DIAGNOSIS — J40 Bronchitis, not specified as acute or chronic: Principal | ICD-10-CM

## 2020-02-02 DIAGNOSIS — Z20822 Suspected COVID-19 virus infection: Principal | ICD-10-CM

## 2020-02-02 MED ORDER — PREDNISONE 20 MG TABLET
ORAL_TABLET | 0 refills | 0 days | Status: CP
Start: 2020-02-02 — End: ?

## 2020-02-02 MED ORDER — IBUPROFEN 600 MG TABLET
ORAL_TABLET | Freq: Three times a day (TID) | ORAL | 0 refills | 10 days | Status: CP | PRN
Start: 2020-02-02 — End: ?

## 2020-02-02 MED ORDER — BENZONATATE 200 MG CAPSULE
ORAL_CAPSULE | Freq: Three times a day (TID) | ORAL | 0 refills | 7.00000 days | Status: CP | PRN
Start: 2020-02-02 — End: 2020-02-09

## 2020-02-02 MED ORDER — FLUCONAZOLE 150 MG TABLET
ORAL_TABLET | Freq: Once | ORAL | 0 refills | 1.00000 days | Status: CP
Start: 2020-02-02 — End: 2020-02-02

## 2020-02-02 MED ORDER — AZITHROMYCIN 250 MG TABLET
ORAL_TABLET | 0 refills | 5 days | Status: CP
Start: 2020-02-02 — End: 2020-02-07

## 2020-02-21 ENCOUNTER — Ambulatory Visit: Admit: 2020-02-21 | Payer: MEDICARE | Attending: Family | Primary: Family

## 2020-03-01 ENCOUNTER — Encounter: Admit: 2020-03-01 | Discharge: 2020-03-02 | Payer: MEDICARE

## 2020-03-01 DIAGNOSIS — N898 Other specified noninflammatory disorders of vagina: Principal | ICD-10-CM

## 2020-03-01 DIAGNOSIS — R109 Unspecified abdominal pain: Principal | ICD-10-CM

## 2020-03-01 DIAGNOSIS — N3000 Acute cystitis without hematuria: Principal | ICD-10-CM

## 2020-03-01 DIAGNOSIS — Z202 Contact with and (suspected) exposure to infections with a predominantly sexual mode of transmission: Principal | ICD-10-CM

## 2020-03-01 MED ORDER — NITROFURANTOIN MONOHYDRATE/MACROCRYSTALS 100 MG CAPSULE
ORAL_CAPSULE | Freq: Two times a day (BID) | ORAL | 0 refills | 7.00000 days | Status: CP
Start: 2020-03-01 — End: 2020-03-08

## 2020-03-01 MED ORDER — FLUCONAZOLE 150 MG TABLET
ORAL_TABLET | Freq: Once | ORAL | 1 refills | 1 days | Status: CP
Start: 2020-03-01 — End: 2020-03-01

## 2020-03-14 ENCOUNTER — Encounter: Admit: 2020-03-14 | Discharge: 2020-03-15 | Payer: MEDICARE | Attending: Family | Primary: Family

## 2020-03-14 DIAGNOSIS — M533 Sacrococcygeal disorders, not elsewhere classified: Principal | ICD-10-CM

## 2020-03-14 DIAGNOSIS — Z6841 Body Mass Index (BMI) 40.0 and over, adult: Principal | ICD-10-CM

## 2020-03-14 MED ORDER — IBUPROFEN 800 MG TABLET
ORAL_TABLET | Freq: Three times a day (TID) | ORAL | 0 refills | 10.00000 days | Status: CP | PRN
Start: 2020-03-14 — End: ?

## 2020-03-14 MED ORDER — CYCLOBENZAPRINE 5 MG TABLET
ORAL_TABLET | Freq: Every evening | ORAL | 0 refills | 10.00000 days | Status: CP
Start: 2020-03-14 — End: ?

## 2020-03-16 ENCOUNTER — Ambulatory Visit: Admit: 2020-03-16 | Discharge: 2020-03-17 | Payer: MEDICARE

## 2020-04-03 MED ORDER — PROPRANOLOL 10 MG TABLET
ORAL_TABLET | Freq: Two times a day (BID) | ORAL | 0 refills | 90 days | Status: CP
Start: 2020-04-03 — End: 2021-04-03

## 2020-04-27 ENCOUNTER — Encounter: Admit: 2020-04-27 | Discharge: 2020-04-28 | Payer: MEDICARE

## 2020-04-27 DIAGNOSIS — R102 Pelvic and perineal pain: Principal | ICD-10-CM

## 2020-04-30 DIAGNOSIS — Z1231 Encounter for screening mammogram for malignant neoplasm of breast: Principal | ICD-10-CM

## 2020-05-03 ENCOUNTER — Encounter: Admit: 2020-05-03 | Discharge: 2020-05-04 | Payer: MEDICARE

## 2020-05-03 DIAGNOSIS — Z1231 Encounter for screening mammogram for malignant neoplasm of breast: Principal | ICD-10-CM

## 2020-05-09 ENCOUNTER — Ambulatory Visit: Admit: 2020-05-09 | Discharge: 2020-05-10 | Payer: MEDICARE | Attending: Family | Primary: Family

## 2020-05-09 ENCOUNTER — Encounter: Admit: 2020-05-09 | Discharge: 2020-05-10 | Payer: MEDICARE

## 2020-05-09 DIAGNOSIS — Z23 Encounter for immunization: Principal | ICD-10-CM

## 2020-05-09 DIAGNOSIS — M533 Sacrococcygeal disorders, not elsewhere classified: Principal | ICD-10-CM

## 2020-05-09 DIAGNOSIS — Z0000 Encounter for general adult medical examination without abnormal findings: Principal | ICD-10-CM

## 2020-05-09 DIAGNOSIS — K581 Irritable bowel syndrome with constipation: Principal | ICD-10-CM

## 2020-05-09 DIAGNOSIS — F419 Anxiety disorder, unspecified: Principal | ICD-10-CM

## 2020-05-09 DIAGNOSIS — Z0001 Encounter for general adult medical examination with abnormal findings: Principal | ICD-10-CM

## 2020-05-09 DIAGNOSIS — M25562 Pain in left knee: Principal | ICD-10-CM

## 2020-05-09 DIAGNOSIS — Z7189 Other specified counseling: Principal | ICD-10-CM

## 2020-05-09 MED ORDER — PROPRANOLOL 10 MG TABLET
ORAL_TABLET | Freq: Three times a day (TID) | ORAL | 0 refills | 30 days
Start: 2020-05-09 — End: 2021-05-09

## 2020-05-09 MED ORDER — LINZESS 145 MCG CAPSULE
ORAL_CAPSULE | Freq: Every day | ORAL | 1 refills | 90 days | Status: CP
Start: 2020-05-09 — End: 2021-05-09

## 2020-05-09 MED ORDER — CYCLOBENZAPRINE 5 MG TABLET
ORAL_TABLET | Freq: Every evening | ORAL | 3 refills | 15 days | Status: CP | PRN
Start: 2020-05-09 — End: ?

## 2020-05-09 MED ORDER — PANTOPRAZOLE 40 MG TABLET,DELAYED RELEASE
ORAL_TABLET | Freq: Every day | ORAL | 3 refills | 30.00000 days | Status: CP
Start: 2020-05-09 — End: 2021-05-09

## 2020-05-10 DIAGNOSIS — M533 Sacrococcygeal disorders, not elsewhere classified: Principal | ICD-10-CM

## 2020-05-14 MED ORDER — IBUPROFEN 800 MG TABLET
ORAL_TABLET | Freq: Three times a day (TID) | ORAL | 0 refills | 10 days | Status: CP | PRN
Start: 2020-05-14 — End: 2020-07-04

## 2020-07-04 DIAGNOSIS — M533 Sacrococcygeal disorders, not elsewhere classified: Principal | ICD-10-CM

## 2020-07-04 DIAGNOSIS — R059 Cough: Principal | ICD-10-CM

## 2020-07-04 DIAGNOSIS — J329 Chronic sinusitis, unspecified: Principal | ICD-10-CM

## 2020-07-04 MED ORDER — AZITHROMYCIN 250 MG TABLET
ORAL_TABLET | 0 refills | 0 days | Status: CP
Start: 2020-07-04 — End: ?

## 2020-07-04 MED ORDER — PREDNISONE 20 MG TABLET
ORAL_TABLET | Freq: Every day | ORAL | 0 refills | 5.00000 days | Status: CP
Start: 2020-07-04 — End: ?

## 2020-07-04 MED ORDER — IBUPROFEN 800 MG TABLET
ORAL_TABLET | Freq: Three times a day (TID) | ORAL | 0 refills | 10 days | Status: CP | PRN
Start: 2020-07-04 — End: ?

## 2020-07-05 ENCOUNTER — Encounter: Admit: 2020-07-05 | Discharge: 2020-07-05 | Payer: MEDICARE

## 2020-08-01 DIAGNOSIS — F419 Anxiety disorder, unspecified: Principal | ICD-10-CM

## 2020-08-01 MED ORDER — PANTOPRAZOLE 40 MG TABLET,DELAYED RELEASE
ORAL_TABLET | 1 refills | 0 days | Status: CP
Start: 2020-08-01 — End: ?

## 2020-08-02 DIAGNOSIS — J4540 Moderate persistent asthma, uncomplicated: Principal | ICD-10-CM

## 2020-08-02 MED ORDER — ALBUTEROL SULFATE HFA 90 MCG/ACTUATION AEROSOL INHALER
Freq: Four times a day (QID) | RESPIRATORY_TRACT | 5 refills | 0.00000 days | Status: CP | PRN
Start: 2020-08-02 — End: 2021-08-02

## 2020-08-13 ENCOUNTER — Ambulatory Visit: Admit: 2020-08-13 | Discharge: 2020-08-14 | Payer: MEDICARE | Attending: Family | Primary: Family

## 2020-08-13 DIAGNOSIS — R0602 Shortness of breath: Principal | ICD-10-CM

## 2020-08-13 DIAGNOSIS — R209 Unspecified disturbances of skin sensation: Principal | ICD-10-CM

## 2020-08-13 DIAGNOSIS — R0789 Other chest pain: Principal | ICD-10-CM

## 2020-08-13 DIAGNOSIS — F418 Other specified anxiety disorders: Principal | ICD-10-CM

## 2020-08-13 MED ORDER — ESCITALOPRAM 10 MG TABLET
ORAL_TABLET | Freq: Every day | ORAL | 3 refills | 30 days | Status: CP
Start: 2020-08-13 — End: 2020-11-11

## 2020-09-04 DIAGNOSIS — J4540 Moderate persistent asthma, uncomplicated: Principal | ICD-10-CM

## 2020-09-04 MED ORDER — ALBUTEROL SULFATE HFA 90 MCG/ACTUATION AEROSOL INHALER
5 refills | 0 days | Status: CP
Start: 2020-09-04 — End: ?

## 2020-09-05 DIAGNOSIS — N3000 Acute cystitis without hematuria: Principal | ICD-10-CM

## 2020-09-06 MED ORDER — ESCITALOPRAM 10 MG TABLET
ORAL_TABLET | Freq: Every day | ORAL | 2 refills | 90.00000 days | Status: CP
Start: 2020-09-06 — End: 2020-12-05

## 2020-09-08 ENCOUNTER — Emergency Department
Admission: EM | Admit: 2020-09-08 | Discharge: 2020-09-08 | Disposition: A | Discharge status: Home / Self Care | Payer: Medicare Other | Attending: Emergency Medicine | Admitting: Emergency Medicine

## 2020-09-08 ENCOUNTER — Emergency Department: Payer: Medicare Other

## 2020-09-08 ENCOUNTER — Other Ambulatory Visit: Payer: Self-pay

## 2020-09-08 DIAGNOSIS — J45909 Unspecified asthma, uncomplicated: Secondary | ICD-10-CM | POA: Diagnosis not present

## 2020-09-08 DIAGNOSIS — R1013 Epigastric pain: Secondary | ICD-10-CM

## 2020-09-08 DIAGNOSIS — F17210 Nicotine dependence, cigarettes, uncomplicated: Secondary | ICD-10-CM | POA: Diagnosis not present

## 2020-09-08 DIAGNOSIS — R112 Nausea with vomiting, unspecified: Secondary | ICD-10-CM | POA: Insufficient documentation

## 2020-09-08 DIAGNOSIS — R197 Diarrhea, unspecified: Secondary | ICD-10-CM | POA: Insufficient documentation

## 2020-09-08 DIAGNOSIS — B9681 Helicobacter pylori [H. pylori] as the cause of diseases classified elsewhere: Secondary | ICD-10-CM | POA: Insufficient documentation

## 2020-09-08 DIAGNOSIS — A048 Other specified bacterial intestinal infections: Secondary | ICD-10-CM

## 2020-09-08 LAB — URINALYSIS, COMPLETE (UACMP) WITH MICROSCOPIC
Bacteria, UA: NONE SEEN
Bilirubin Urine: NEGATIVE
Glucose, UA: NEGATIVE mg/dL
Hgb urine dipstick: NEGATIVE
Ketones, ur: NEGATIVE mg/dL
Leukocytes,Ua: NEGATIVE
Nitrite: NEGATIVE
Protein, ur: NEGATIVE mg/dL
Specific Gravity, Urine: 1.005 (ref 1.005–1.030)
Squamous Epithelial / HPF: NONE SEEN (ref 0–5)
pH: 6 (ref 5.0–8.0)

## 2020-09-08 LAB — COMPREHENSIVE METABOLIC PANEL
ALT: 18 U/L (ref 0–44)
AST: 20 U/L (ref 15–41)
Albumin: 3.5 g/dL (ref 3.5–5.0)
Alkaline Phosphatase: 47 U/L (ref 38–126)
Anion gap: 8 (ref 5–15)
BUN: 10 mg/dL (ref 6–20)
CO2: 23 mmol/L (ref 22–32)
Calcium: 8.7 mg/dL — ABNORMAL LOW (ref 8.9–10.3)
Chloride: 108 mmol/L (ref 98–111)
Creatinine, Ser: 0.8 mg/dL (ref 0.44–1.00)
GFR, Estimated: >60 mL/min (ref >60)
Glucose, Bld: 91 mg/dL (ref 70–99)
Potassium: 3.8 mmol/L (ref 3.5–5.1)
Sodium: 139 mmol/L (ref 135–145)
Total Bilirubin: 0.5 mg/dL (ref 0.3–1.2)
Total Protein: 6.7 g/dL (ref 6.5–8.1)

## 2020-09-08 LAB — CBC
HCT: 39.1 % (ref 36.0–46.0)
Hemoglobin: 13.2 g/dL (ref 12.0–15.0)
MCH: 31.2 pg (ref 26.0–34.0)
MCHC: 33.8 g/dL (ref 30.0–36.0)
MCV: 92.4 fL (ref 80.0–100.0)
Platelets: 298 10*3/uL (ref 150–400)
RBC: 4.23 MIL/uL (ref 3.87–5.11)
RDW: 12.5 % (ref 11.5–15.5)
WBC: 9.2 10*3/uL (ref 4.0–10.5)
nRBC: 0 % (ref 0.0–0.2)

## 2020-09-08 LAB — LIPASE, BLOOD: Lipase: 45 U/L (ref 11–51)

## 2020-09-08 MED ORDER — IOHEXOL 300 MG/ML  SOLN
125.0000 mL | Freq: Once | INTRAMUSCULAR | Status: AC | PRN
  Administered 2020-09-08: 125 mL via INTRAVENOUS

## 2020-09-08 MED ORDER — PROMETHAZINE HCL 25 MG PO TABS: 25.0000 mg | ORAL_TABLET | Freq: Four times a day (QID) | ORAL | 1 refills | Status: DC | PRN

## 2020-09-08 MED ORDER — MORPHINE SULFATE (PF) 2 MG/ML IV SOLN
2.0000 mg | Freq: Once | INTRAVENOUS | Status: AC
  Administered 2020-09-08: 2 mg via INTRAVENOUS
  Filled 2020-09-08: qty 1

## 2020-09-08 MED ORDER — FLUCONAZOLE 150 MG PO TABS: 150.0000 mg | ORAL_TABLET | Freq: Once | ORAL | 0 refills | Status: AC

## 2020-09-08 MED ORDER — CLARITHROMYCIN 500 MG PO TABS: 500.0000 mg | ORAL_TABLET | Freq: Two times a day (BID) | ORAL | 0 refills | Status: AC

## 2020-09-08 MED ORDER — OMEPRAZOLE 20 MG PO CPDR
20.0000 mg | DELAYED_RELEASE_CAPSULE | Freq: Every day | ORAL | 6 refills | Status: DC
Start: 2020-09-08 — End: 2024-01-16

## 2020-09-08 MED ORDER — METRONIDAZOLE 500 MG PO TABS: 500.0000 mg | ORAL_TABLET | Freq: Two times a day (BID) | ORAL | 0 refills | Status: AC

## 2020-09-08 MED ORDER — ONDANSETRON HCL 4 MG/2ML IJ SOLN
4.0000 mg | Freq: Once | INTRAMUSCULAR | Status: AC
  Administered 2020-09-08: 4 mg via INTRAVENOUS
  Filled 2020-09-08: qty 2

## 2020-09-08 MED ORDER — CLARITHROMYCIN 500 MG PO TABS
500.0000 mg | ORAL_TABLET | Freq: Two times a day (BID) | ORAL | Status: DC
  Filled 2020-09-08: qty 1

## 2020-09-08 MED ORDER — HYDROCODONE-ACETAMINOPHEN 5-325 MG PO TABS: 1.0000 | ORAL_TABLET | ORAL | 0 refills | Status: DC | PRN

## 2020-09-08 MED ORDER — SODIUM CHLORIDE 0.9 % IV BOLUS
1000.0000 mL | Freq: Once | INTRAVENOUS | Status: AC
  Administered 2020-09-08: 1000 mL via INTRAVENOUS

## 2020-09-08 MED ORDER — METRONIDAZOLE 500 MG PO TABS
500.0000 mg | ORAL_TABLET | Freq: Once | ORAL | Status: AC
  Administered 2020-09-08: 500 mg via ORAL
  Filled 2020-09-08: qty 1

## 2020-09-08 NOTE — ED Triage Notes (Signed)
Pt comes pov with abd pain. Pt has IBS and PCP told pt to take her IBS meds but nothing is working. Pain is getting worse. Pt states swelling to abdomen and it difficult to keep liquids down. States coffee helps.

## 2020-09-08 NOTE — ED Notes (Signed)
Patient advised she was seen at PCP 2/28 for same symptoms. 08/24/20 she spoke with [facility] and they could not fit her in. Labwork was normal in February.

## 2020-09-08 NOTE — ED Provider Notes (Signed)
[facility] Emergency Department Provider Note  ____________________________________________  Time seen: Approximately 3:43 PM  I have reviewed the triage vital signs and the nursing notes.   HISTORY  Chief Complaint Abdominal Pain    HPI Kendra Clark is a 45 y.o. female who presents the emergency department complaining of abdominal pain, specifically epigastric pain.  Patient states that she has a history of IBS but the symptoms have not felt consistent with IBS.  She states that she is talked to her primary care and her GI doctor and is trying some medications that have not alleviated her symptoms.  Patient believes that she may have H. pylori as she has had this in the past with similar symptoms.  She has nausea, small amounts of emesis, diffuse abdominal pain worse in the epigastric region.  She did have a period of constipation that lasted for several days but she states that her normal constipation medications have improved that symptom.  She has no urinary symptoms.  No fevers or chills.  Nonbloody emesis.  Nonbilious emesis.  Patient is seen no dark or tarry stools.  No rectal bleeding.         Past Medical History:  Diagnosis Date  . Anxiety   . Asthma   . Atopy   . Bipolar disorder (HCC)   . Degenerative disc disease, lumbar   . Depression   . Hiatal hernia   . History of mitral valve disease   . Hyperlipidemia   . IBS (irritable bowel syndrome)   . Osteoporosis   . PTSD (post-traumatic stress disorder)     Patient Active Problem List   Diagnosis Date Noted  . Coronary artery calcification 03/26/2015  . Morbid obesity (HCC) 03/26/2015    Past Surgical History:  Procedure Laterality Date  . ABDOMINAL HYSTERECTOMY    . ABDOMINAL HYSTERECTOMY    . CESAREAN SECTION     x 3   . GALLBLADDER SURGERY    . TONSILLECTOMY AND ADENOIDECTOMY      Prior to Admission medications   Medication Sig Start Date End Date Taking? Authorizing  Provider  clarithromycin (BIAXIN) 500 MG tablet Take 1 tablet (500 mg total) by mouth 2 (two) times daily for 14 days. 09/08/20 09/22/20 Yes Shameer Molstad, Delorise RoyalsJonathan D, PA-C  fluconazole (DIFLUCAN) 150 MG tablet Take 1 tablet (150 mg total) by mouth once for 1 dose. 09/08/20 09/08/20 Yes Essense Bousquet, Delorise RoyalsJonathan D, PA-C  HYDROcodone-acetaminophen (NORCO/VICODIN) 5-325 MG tablet Take 1 tablet by mouth every 4 (four) hours as needed for moderate pain. 09/08/20  Yes Juliana Boling, Delorise RoyalsJonathan D, PA-C  metroNIDAZOLE (FLAGYL) 500 MG tablet Take 1 tablet (500 mg total) by mouth 2 (two) times daily for 14 days. 09/08/20 09/22/20 Yes Alwaleed Obeso, Delorise RoyalsJonathan D, PA-C  omeprazole (PRILOSEC) 20 MG capsule Take 1 capsule (20 mg total) by mouth daily. 09/08/20  Yes Wren Gallaga, Delorise RoyalsJonathan D, PA-C  promethazine (PHENERGAN) 25 MG tablet Take 1 tablet (25 mg total) by mouth every 6 (six) hours as needed for nausea or vomiting. 09/08/20  Yes Yarely Bebee, Delorise RoyalsJonathan D, PA-C  azelastine (OPTIVAR) 0.05 % ophthalmic solution INSTILL 1 DROP INTO BOTH EYES TWICE A DAY FOR 10 DAYS AS NEEDED 03/03/18   [provider]  cyclobenzaprine (FLEXERIL) 5 MG tablet Take 1 tablet (5 mg total) by mouth 3 (three) times daily as needed for muscle spasms. 10/12/19   Concha SeFunke, Mary E, MD  diclofenac (VOLTAREN) 75 MG EC tablet Take 75 mg by mouth 2 (two) times daily.  [provider]  esomeprazole (NEXIUM) 40 MG capsule Take by mouth. 06/26/15   [provider]  furosemide (LASIX) 20 MG tablet  08/26/18   [provider]  hydrOXYzine (VISTARIL) 25 MG capsule TAKE 1 CAPSULE BY MOUTH 2 (TWO) TIMES DAILY AS NEEDED. FOR SEVERE ANXIETY ATTACKS ONLY, SLEEP 11/01/18   Jomarie Longs, MD  lidocaine (XYLOCAINE) 2 % solution Use as directed 5 mLs in the mouth or throat every 6 (six) hours as needed for mouth pain. 12/24/18   [name], PA-C  lubiprostone (AMITIZA) 24 MCG capsule Take by mouth. 04/06/08   [provider]  umeclidinium bromide  (INCRUSE ELLIPTA) 62.5 MCG/INH AEPB  09/13/18   [provider]  ziprasidone (GEODON) 40 MG capsule TAKE 1 CAPSULE (40 MG TOTAL) BY MOUTH DAILY WITH SUPPER. MOOD 11/01/18   Jomarie Longs, MD    Allergies Penicillins and Tramadol  Family History  Problem Relation Age of Onset  . Hypertension Father   . Hyperlipidemia Father   . Hypertension Mother   . Hyperlipidemia Mother   . Arrhythmia Mother        A-fib  . Breast cancer Mother 61  . Breast cancer Paternal Aunt 99  . Breast cancer Paternal Grandmother 69    Social History Social History   Tobacco Use  . Smoking status: Current Every Day Smoker    Packs/day: 1.50    Years: 13.00    Pack years: 19.50    Types: Cigarettes  . Smokeless tobacco: Never Used  Vaping Use  . Vaping Use: Never used  Substance Use Topics  . Alcohol use: No  . Drug use: Yes    Types: Marijuana     Review of Systems  Constitutional: No fever/chills Eyes: No visual changes. No discharge ENT: No upper respiratory complaints. Cardiovascular: no chest pain. Respiratory: no cough. No SOB. Gastrointestinal: Diffuse abdominal pain with most symptoms in the epigastric region.  Positive for nausea and intermittent emesis.  No diarrhea.  Patient did have some constipation that was resolved with normal medicines.  No constipation currently.   Genitourinary: Negative for dysuria. No hematuria Musculoskeletal: Negative for musculoskeletal pain. Skin: Negative for rash, abrasions, lacerations, ecchymosis. Neurological: Negative for headaches, focal weakness or numbness.  10 System ROS otherwise negative.  ____________________________________________   PHYSICAL EXAM:  VITAL SIGNS: ED Triage Vitals  Enc Vitals Group     BP 09/08/20 1355 135/72     Pulse Rate 09/08/20 1355 90     Resp 09/08/20 1355 16     Temp 09/08/20 1355 (!) 97.5 F (36.4 C)     Temp Source 09/08/20 1355 Oral     SpO2 09/08/20 1355 100 %     Weight 09/08/20 1356  (!) 320 lb (145.2 kg)     Height 09/08/20 1356 5\' 5"  (1.651 m)     Head Circumference --      Peak Flow --      Pain Score 09/08/20 1400 9     Pain Loc --      Pain Edu? --      Excl. in GC? --      Constitutional: Alert and oriented. Well appearing and in no acute distress. Eyes: Conjunctivae are normal. PERRL. EOMI. Head: Atraumatic. ENT:      Ears:       Nose: No congestion/rhinnorhea.      Mouth/Throat: Mucous membranes are moist.  Neck: No stridor.  Hematological/Lymphatic/Immunilogical: No cervical lymphadenopathy. Cardiovascular: Normal rate, regular rhythm. Normal  S1 and S2.  Good peripheral circulation. Respiratory: Normal respiratory effort without tachypnea or retractions. Lungs CTAB. Good air entry to the bases with no decreased or absent breath sounds. Gastrointestinal: Visualization of the abdominal wall reveals no acute findings.  Bowel sounds 4 quadrants.  Abdomen soft to palpation.  Diffuse tenderness to palpation with most specific tenderness in the epigastric region.  No guarding or rigidity. No palpable masses. No distention. No CVA tenderness. Musculoskeletal: Full range of motion to all extremities. No gross deformities appreciated. Neurologic:  Normal speech and language. No gross focal neurologic deficits are appreciated.  Skin:  Skin is warm, dry and intact. No rash noted. Psychiatric: Mood and affect are normal. Speech and behavior are normal. Patient exhibits appropriate insight and judgement.   ____________________________________________   LABS (all labs ordered are listed, but only abnormal results are displayed)  Labs Reviewed  COMPREHENSIVE METABOLIC PANEL - Abnormal; Notable for the following components:      Result Value   Calcium 8.7 (*)    All other components within normal limits  URINALYSIS, COMPLETE (UACMP) WITH MICROSCOPIC - Abnormal; Notable for the following components:   Color, Urine STRAW (*)    APPearance CLEAR (*)    All other  components within normal limits  LIPASE, BLOOD  CBC   ____________________________________________  EKG   ____________________________________________  RADIOLOGY I personally viewed and evaluated these images as part of my medical decision making, as well as reviewing the written report by the radiologist.  ED Provider Interpretation: No acute findings in the abdomen pelvis on CT.  CT ABDOMEN PELVIS W CONTRAST  Result Date: 09/08/2020 CLINICAL DATA:  Nausea and vomiting with epigastric pain. EXAM: CT ABDOMEN AND PELVIS WITH CONTRAST TECHNIQUE: Multidetector CT imaging of the abdomen and pelvis was performed using the standard protocol following bolus administration of intravenous contrast. CONTRAST:  OMNIPAQUE IOHEXOL 300 MG/ML  SOLN COMPARISON:  Most recent CT 03/02/2018 FINDINGS: Lower chest: No pleural fluid no acute airspace disease. Hepatobiliary: The liver is enlarged spanning 22 cm cranial caudal with diffuse steatosis. Mild capsular nodularity is again seen. No focal liver abnormality. Cholecystectomy without biliary dilatation. Pancreas: No ductal dilatation or inflammation. Spleen: Normal in size without focal abnormality. Adrenals/Urinary Tract: Normal adrenal glands. No hydronephrosis or perinephric edema. Homogeneous renal enhancement with symmetric excretion on delayed phase imaging. Possible punctate nonobstructing stone in the lower right kidney. There is a stable left renal cyst. Urinary bladder is physiologically distended without wall thickening. Stomach/Bowel: Decompressed stomach. No small bowel obstruction or inflammation. Normal appendix. Small volume of colonic stool. No colonic wall thickening. Vascular/Lymphatic: Patent portal vein. Normal caliber abdominal aorta. Mild aortic atherosclerosis. No abdominopelvic adenopathy. Reproductive: Post hysterectomy with stable prominence of the vaginal cuff. No adnexal mass. Other: Postsurgical change of the anterior abdominal  wall. No ascites or free air. No focal fluid collection. Musculoskeletal: Mild lower lumbar facet hypertrophy. There are no acute or suspicious osseous abnormalities. IMPRESSION: 1. No acute abnormality in the abdomen/pelvis. 2. Hepatomegaly and hepatic steatosis. Mild capsular nodularity is again seen, raising concern for cirrhosis. 3. Possible punctate nonobstructing stone in the lower right kidney. Aortic Atherosclerosis (ICD10-I70.0). Electronically Signed   By: Narda Rutherford M.D.   On: 09/08/2020 17:16    ____________________________________________    PROCEDURES  Procedure(s) performed:    Procedures    Medications  clarithromycin (BIAXIN) tablet 500 mg (has no administration in time range)  sodium chloride 0.9 % bolus 1,000 mL (1,000 mLs Intravenous New Bag/Given 09/08/20 1547)  ondansetron (ZOFRAN) injection 4 mg (4 mg Intravenous Given 09/08/20 1547)  morphine 2 MG/ML injection 2 mg (2 mg Intravenous Given 09/08/20 1611)  iohexol (OMNIPAQUE) 300 MG/ML solution 125 mL (125 mLs Intravenous Contrast Given 09/08/20 1651)  metroNIDAZOLE (FLAGYL) tablet 500 mg (500 mg Oral Given 09/08/20 1806)     ____________________________________________   INITIAL IMPRESSION / ASSESSMENT AND PLAN / ED COURSE  Pertinent labs & imaging results that were available during my care of the patient were reviewed by me and considered in my medical decision making (see chart for details).  Review of the [place] CSRS was performed in accordance of the [facility] prior to dispensing any controlled drugs.           Patient's diagnosis is consistent with H. pylori, epigastric pain.  Patient presents emergency department with symptoms consistent with a previous H. pylori infection.  Patient states that she has had nausea, vomiting, diarrhea and epigastric pain times a month.  Patient states that she has episodes of diarrhea, then constipation but primary complaint is epigastric pain with nausea vomiting.  Patient  had global tenderness on exam but most significantly tender in the epigastric region.  She states that this is consistent with her previous H. pylori.  She talked to her GI specialist at [facility] but states that they did not call any prescriptions in for her.  As the pain continued to worsen she presents for evaluation.  Overall labs are reassuring.  Imaging revealed no acute findings.  Given the fact that most of the tenderness is in the epigastric region this consistent with her previously diagnosed H. pylori I will treat for H. pylori at this time.  Patient will have clarithromycin, metronidazole as she is allergic to penicillin, and I will change her PPI.  Patient will have a limited prescription for pain medication, nausea medication at home.  Patient requested that I prescribe Diflucan for after her antibiotics are finished.  Patient is stable for discharge at this time. Patient is given ED precautions to return to the ED for any worsening or new symptoms.     ____________________________________________  FINAL CLINICAL IMPRESSION(S) / ED DIAGNOSES  Final diagnoses:  Epigastric pain  H. pylori infection      NEW MEDICATIONS STARTED DURING THIS VISIT:  ED Discharge Orders         Ordered    metroNIDAZOLE (FLAGYL) 500 MG tablet  2 times daily        09/08/20 1805    omeprazole (PRILOSEC) 20 MG capsule  Daily        09/08/20 1805    HYDROcodone-acetaminophen (NORCO/VICODIN) 5-325 MG tablet  Every 4 hours PRN        09/08/20 1805    promethazine (PHENERGAN) 25 MG tablet  Every 6 hours PRN        09/08/20 1805    clarithromycin (BIAXIN) 500 MG tablet  2 times daily        09/08/20 1805    fluconazole (DIFLUCAN) 150 MG tablet   Once        09/08/20 1805              This chart was dictated using voice recognition software/Dragon. Despite best efforts to proofread, errors can occur which can change the meaning. Any change was purely unintentional.    Racheal Patches,  PA-C 09/08/20 1807    Shaune Pollack, MD 09/08/20 [phone]

## 2020-09-08 NOTE — ED Triage Notes (Signed)
First Nurse Note:  Arrives c/o month long history of abdominal pain, swelling, and SOB.    Patient is AAOx3.  Skin warm and dry. NAD.  No SOB/ DOE.

## 2020-09-10 MED ORDER — FLUTICASONE PROPIONATE 110 MCG/ACTUATION HFA AEROSOL INHALER
Freq: Two times a day (BID) | RESPIRATORY_TRACT | 0 refills | 0 days | Status: CP
Start: 2020-09-10 — End: 2021-09-10

## 2020-09-12 DIAGNOSIS — M533 Sacrococcygeal disorders, not elsewhere classified: Principal | ICD-10-CM

## 2020-09-12 MED ORDER — IBUPROFEN 800 MG TABLET
ORAL_TABLET | Freq: Three times a day (TID) | ORAL | 0 refills | 10.00000 days | Status: CP | PRN
Start: 2020-09-12 — End: ?

## 2020-09-21 ENCOUNTER — Encounter: Admit: 2020-09-21 | Discharge: 2020-09-22 | Payer: MEDICARE

## 2020-09-26 DIAGNOSIS — R079 Chest pain, unspecified: Principal | ICD-10-CM

## 2020-09-28 ENCOUNTER — Encounter: Admit: 2020-09-28 | Discharge: 2020-09-28 | Payer: MEDICARE

## 2020-09-28 ENCOUNTER — Ambulatory Visit: Admit: 2020-09-28 | Discharge: 2020-09-28 | Payer: MEDICARE

## 2020-09-28 DIAGNOSIS — R079 Chest pain, unspecified: Principal | ICD-10-CM

## 2020-09-28 DIAGNOSIS — R002 Palpitations: Principal | ICD-10-CM

## 2020-09-28 MED ORDER — VARENICLINE 0.5 MG (11)-1 MG (42) TABLETS IN A DOSE PACK
0 refills | 0 days | Status: CP
Start: 2020-09-28 — End: 2020-12-27

## 2020-10-11 ENCOUNTER — Encounter: Admit: 2020-10-11 | Discharge: 2020-10-12 | Payer: MEDICARE

## 2020-11-13 ENCOUNTER — Ambulatory Visit: Admit: 2020-11-13 | Discharge: 2020-11-14 | Payer: MEDICARE | Attending: Family | Primary: Family

## 2020-11-13 DIAGNOSIS — E559 Vitamin D deficiency, unspecified: Principal | ICD-10-CM

## 2020-11-13 DIAGNOSIS — Z23 Encounter for immunization: Principal | ICD-10-CM

## 2020-11-13 DIAGNOSIS — Z7289 Other problems related to lifestyle: Principal | ICD-10-CM

## 2020-11-13 DIAGNOSIS — M5410 Radiculopathy, site unspecified: Principal | ICD-10-CM

## 2020-11-13 MED ORDER — DICLOFENAC SODIUM 50 MG TABLET,DELAYED RELEASE
ORAL_TABLET | Freq: Two times a day (BID) | ORAL | 6 refills | 30.00000 days | Status: CP
Start: 2020-11-13 — End: 2020-11-13

## 2020-11-13 MED ORDER — ERGOCALCIFEROL (VITAMIN D2) 1,250 MCG (50,000 UNIT) CAPSULE
ORAL_CAPSULE | ORAL | 2 refills | 84 days | Status: CP
Start: 2020-11-13 — End: ?

## 2020-11-13 MED ORDER — METHOCARBAMOL 750 MG TABLET
ORAL_TABLET | Freq: Four times a day (QID) | ORAL | 1 refills | 15 days | Status: CP
Start: 2020-11-13 — End: ?

## 2020-11-30 MED ORDER — PRAVASTATIN 40 MG TABLET
ORAL_TABLET | Freq: Every day | ORAL | 6 refills | 30 days | Status: CP
Start: 2020-11-30 — End: 2020-12-30

## 2021-01-01 ENCOUNTER — Encounter: Admit: 2021-01-01 | Discharge: 2021-01-02 | Payer: MEDICARE

## 2021-01-01 DIAGNOSIS — M549 Dorsalgia, unspecified: Principal | ICD-10-CM

## 2021-01-01 DIAGNOSIS — N760 Acute vaginitis: Principal | ICD-10-CM

## 2021-01-01 DIAGNOSIS — R399 Unspecified symptoms and signs involving the genitourinary system: Principal | ICD-10-CM

## 2021-01-01 MED ORDER — FLUCONAZOLE 150 MG TABLET
ORAL_TABLET | Freq: Once | ORAL | 1 refills | 2 days | Status: CP
Start: 2021-01-01 — End: 2021-01-01

## 2021-02-04 DIAGNOSIS — F419 Anxiety disorder, unspecified: Principal | ICD-10-CM

## 2021-02-14 NOTE — Unmapped (Signed)
DIVISION OF CARDIOLOGY   [facility], [place]                                                                         Date of Service:  02/19/2021     Assessment/Plan   1. Palpitations  Patient with PVC noted during triggered events while wearing monitor.  We talked about preventative strategies mostly through lifestyle modifications to prevent palpitations.    2. Coronary artery calcification  PET perfusion study showed coronary artery calcifications even though there is no ischemic burden.  I recommended standard treatment for stable coronary artery disease.  She will remain on her aspirin and will attempt low-dose atorvastatin with addition of Repatha with LDL goal of less than 70.    3. Hyperlipidemia, unspecified hyperlipidemia type  As described above.  - evolocumab 140 mg/mL PnIj; Inject the contents of one pen (140 mg) under the skin every fourteen (14) days.  Dispense: 6 mL; Refill: 3    - evolocumab 140 mg/mL PnIj; Inject 140 mg under the skin every fourteen (14) days.  Dispense: 6 mL; Refill: 3      Return to clinic:  Return in about 3 months (around 05/21/2021).    I personally spent 31 minutes face-to-face and non-face-to-face in the care of this patient, which includes all pre, intra, and post visit time on the date of service.     Return in about 3 months (around 05/21/2021).    Subjective:   YNW:GNFAOZ D Nile Dear, NP  Chief complaint:  45 y.o. female with a history of GERD, obesity, PTSD, depression and anxiety  is referred by Desmond Dike, NP for evaluation of chest pain and SOB.    History of Present Illness:  Patient saw her PCP on 08/13/20 endorsing episodes of chest wall pain and SOB. Echo was ordered which showed LVEF 55%, and normal RV systolic function. Patient was last seen in clinic by me to establish care. She reported episodes of chest pain that have been ongoing for the prior 6 months. Episodes start out with sensation of a hot flash and a gradual squeezing sensation in her chest with radiation into her neck, racing heart beats, and occasional shortness of breath, dizziness, and diaphoresis. She was tried on crestor in the past, but unable to tolerate. Started ASA 81mg . I also started patient on pravastatin 40mg  on 11/29/20. Stress test showed no ischemia or scar. Ziopatch showed SR with one short episode of SVT.    Patient has tried simvastatin and pravastatin to control her cholesterol but had significant myalgias and muscle cramps with taking this. She is not currently on a statin. Patient takes ASA daily. She is interested in trying evolocumab. Patient has required to take lovenox injections in the past. She is agreeable to trying lipitor and evolocumab. Patient has been cutting back on unhealthy foods. She has lost ~15 lbs.  Endorses tobacco use, 1/2 ppd. Started smoking at the age of 48, previously 4 ppd. Previously tried on chantix and nicotine patches. Mother had heart attack at age of 24, with history of atrial fibrillation. Sister had a heart attack at 41, and history of strokes. Lives with 35 year old granddaughter. Has two children.  Past Medical History  Patient Active Problem List   Diagnosis   ??? Anxiety   ??? GERD (gastroesophageal reflux disease)   ??? Obesity   ??? Vitamin D deficiency   ??? Morbid (severe) obesity due to excess calories (CMS-HCC)   ??? Moderate persistent asthma without complication       Medications:  Current Outpatient Medications   Medication Sig Dispense Refill   ??? acetaminophen (TYLENOL ARTHRITIS ORAL) Take by mouth.     ??? albuterol 2.5 mg /3 mL (0.083 %) nebulizer solution Inhale 3 mL (2.5 mg total) by nebulization Four (4) times a day. 100 mL 3   ??? albuterol HFA 90 mcg/actuation inhaler INHALE 2 PUFFS BY MOUTH EVERY 6 HOURS AS NEEDED FOR WHEEZE 18 g 5   ??? cyclobenzaprine (FLEXERIL) 5 MG tablet Take 1-2 tablets (5-10 mg total) by mouth nightly as needed. For pain. 30 tablet 3   ??? diclofenac (VOLTAREN) 50 MG EC tablet Take 1 tablet (50 mg total) by mouth Two (2) times a day. For arthritis pain. 60 tablet 6   ??? ergocalciferol-1,250 mcg, 50,000 unit, (DRISDOL) 1,250 mcg (50,000 unit) capsule Take 1 capsule (1,250 mcg total) by mouth once a week. 12 capsule 2   ??? fluticasone propionate (FLOVENT HFA) 110 mcg/actuation inhaler Inhale 2 puffs Two (2) times a day. As needed for cough 12 g 0   ??? ipratropium-albuterol (DUO-NEB) 0.5-2.5 mg/3 mL nebulizer Inhale 3 mL every six (6) hours as needed.      ??? linaCLOtide (LINZESS) 145 mcg capsule Take 1 capsule (145 mcg total) by mouth daily. 90 capsule 1   ??? methocarbamoL (ROBAXIN) 750 MG tablet Take 1 tablet (750 mg total) by mouth four (4) times a day. As needed for pain. 60 tablet 1   ??? multivitamin (TAB-A-VITE/THERAGRAN) per tablet Take 1 tablet by mouth daily.     ??? olopatadine (PATADAY) 0.2 % ophthalmic solution Administer 1 drop to both eyes Two (2) times a day.     ??? omeprazole (PRILOSEC) 20 MG capsule Take 20 mg by mouth daily.     ??? propylene glycol (SYSTANE BALANCE OPHT) Apply 1 drop to eye nightly. Both eyes     ??? atorvastatin (LIPITOR) 10 MG tablet Take 1 tablet (10 mg total) by mouth daily. 30 tablet 6   ??? escitalopram oxalate (LEXAPRO) 10 MG tablet TAKE 1 TABLET (10 MG TOTAL) BY MOUTH DAILY. FOR ANXIETY AND MOOD. 90 tablet 2   ??? evolocumab 140 mg/mL PnIj Inject 140 mg under the skin every fourteen (14) days. 6 mL 3   ??? furosemide (LASIX) 20 MG tablet Take 1 tablet (20 mg total) by mouth Two (2) times a day. (Patient taking differently: Take 20 mg by mouth in the morning. Takes once daily, and once extra prn.) 180 tablet 1     No current facility-administered medications for this visit.       Allergies  Allergies   Allergen Reactions   ??? Penicillins Hives and Shortness Of Breath     Can take amoxicillian   ??? Tramadol Hives and Shortness Of Breath     Breathing difficulty   Blurred vision  Chest pain   ??? Pravastatin Muscle Pain   ??? Simvastatin Muscle Pain       Social History:   Social History Tobacco Use   ??? Smoking status: Current Every Day Smoker     Packs/day: 0.30     Years: 14.00     Pack years: 4.20  Types: Cigarettes   ??? Smokeless tobacco: Never Used   ??? Tobacco comment: Working on quiting - 1PPD   Vaping Use   ??? Vaping Use: Never used   Substance Use Topics   ??? Alcohol use: No     Alcohol/week: 0.0 standard drinks   ??? Drug use: Yes     Types: Marijuana     Comment: 1-3x monthly        Family History:  Family History   Problem Relation Age of Onset   ??? Colorectal Cancer Father    ??? Arthritis Father    ??? Diabetes Father    ??? Drug abuse Father    ??? Heart disease Father    ??? Hypertension Father    ??? Stroke Father    ??? Clotting disorder Father    ??? Ulcers Father    ??? Colorectal Cancer Paternal Uncle    ??? Ovarian cancer Mother    ??? Arthritis Mother    ??? Asthma Mother    ??? Cancer Mother         Uteral   ??? COPD Mother    ??? Depression Mother    ??? Diabetes Mother    ??? Heart disease Mother    ??? Hyperlipidemia Mother    ??? Hypertension Mother    ??? Kidney disease Mother    ??? Mental illness Mother    ??? Stroke Mother    ??? Vision loss Mother         Due to massive heart attack   ??? Clotting disorder Mother    ??? Liver disease Mother    ??? Ulcers Mother    ??? Glaucoma Mother    ??? Breast cancer Paternal Grandmother    ??? Asthma Sister    ??? Depression Sister    ??? Diabetes Sister    ??? Heart disease Sister    ??? Hypertension Sister    ??? Stroke Sister    ??? Birth defects Sister         Prime   ??? Clotting disorder Sister    ??? Ulcers Sister    ??? Cancer Maternal Grandmother         Breast   ??? Early death Sister    ??? Early death Brother    ??? Learning disabilities Son    ??? Learning disabilities Son    ??? Aneurysm Paternal Grandfather        ROS- 12 system review is negative other than what is specified in the History of Present Illness.      Objective:   Physical Exam  Vitals:    02/19/21 1452   BP: 135/63   BP Site: L Arm   BP Position: Sitting   BP Cuff Size: Large   Pulse: 60   Temp: 36 ??C (96.8 ??F)   TempSrc: Temporal SpO2: 97%   Weight: (!) 134.2 kg (295 lb 12.8 oz)   Height: 165.1 cm (5' 5)      General-  Obese appearing female in no apparent distress.  Neurologic- Alert and oriented X3.  Cranial nerve II-XII grossly intact.  HEENT-  Normocephalic atraumatic head.  No scleral icterus.  Wearing face mask.  Psych- Normal mood, appropriate.    Exam was deferred due to counseling.     Laboratory data:    I have personally reviewed the images of the following diagnostic studies.      Electrocardiogram:  From 09/28/20 showed SR, borderline low voltage.    Echocardiogram:  From  06/2015 at [facility] showed LV systolic function and size were normal. Estimated EF 60-65%. Wall motion was normal; there were no regional wall motion abnormalities. Left ventricular diastolic function parameters were normal. LA normal in size. RV systolic function was normal. Pulmonary artery systolic pressure was within the normal range.     From 09/21/20 showed LV upper normal in size with normal wall thickness, LVEF 55%, normal RV systolic function, no significant valvular abnormalities.    Nuclear stress test:  PET CT stress test showed 10/11/20 showed normal myocardial perfusion study. No evidence of any significant ischemia or scar. Left ventricular systolic function is normal. Post stress the ejection fraction is > 60%. Minimal coronary calcifications are noted. Status post cholecystectomy.    Cardiac monitor:  From 09/28/20 showed patient had a min HR of 48 bpm, max HR of 169 bpm, and avg HR of 80 bpm. Predominant underlying rhythm was Sinus Rhythm. 1 run of   Supraventricular Tachycardia occurred lasting 4 beats with a max rate of 169 bpm (avg 150 bpm). Isolated SVEs were rare (<1.0%), SVE Couplets were rare (<1.0%), and SVE Triplets were rare (<1.0%). Isolated VEs were rare (<1.0%), VE Couplets were rare (<1.0%), and no VE Triplets were Present. There were 55 patient triggered events. ??49 of the episodes corresponded to normal sinus rhythm. ??6 episodes corresponded to isolated PVCs.    Lipid panel:  Component      Latest Ref Rng & Units 09/28/2020   Triglycerides      0 - 150 mg/dL 540 (H)   Cholesterol      <=200 mg/dL 981 (H)   HDL      40 - 60 mg/dL 61 (H)   LDL calculated      40 - 99 mg/dL 191 (H)   VLDL Cholesterol Cal      9 - 37 mg/dL 47.8   Chol/HDL Ratio      1.0 - 4.5 4.2   Non-HDL Cholesterol      70 - 295 mg/dL 621 (H)   FASTING       Unknown       Documentation assistance was provided by Halina Maidens, Scribe on February 14, 2021 at 10:59 AM for Joneen Roach, MD.     I have reviewed the documentation provided by the scribe and confirm that it accurately reflects the service I personally performed and the decisions made by me.  Signature: CSD  Date: 02/19/2021  Time: 3:22 PM

## 2021-02-19 ENCOUNTER — Encounter: Admit: 2021-02-19 | Discharge: 2021-02-20 | Payer: MEDICARE

## 2021-02-19 DIAGNOSIS — R002 Palpitations: Principal | ICD-10-CM

## 2021-02-19 DIAGNOSIS — I2510 Atherosclerotic heart disease of native coronary artery without angina pectoris: Principal | ICD-10-CM

## 2021-02-19 DIAGNOSIS — I2584 Coronary atherosclerosis due to calcified coronary lesion: Principal | ICD-10-CM

## 2021-02-19 DIAGNOSIS — E785 Hyperlipidemia, unspecified: Principal | ICD-10-CM

## 2021-02-19 MED ORDER — ATORVASTATIN 10 MG TABLET
ORAL_TABLET | Freq: Every day | ORAL | 6 refills | 30 days | Status: CP
Start: 2021-02-19 — End: 2021-03-21

## 2021-02-19 MED ORDER — EVOLOCUMAB 140 MG/ML SUBCUTANEOUS PEN INJECTOR
SUBCUTANEOUS | 3 refills | 0 days | Status: CP
Start: 2021-02-19 — End: ?
  Filled 2021-02-25: qty 6, 84d supply, fill #0

## 2021-02-19 NOTE — Unmapped (Signed)
Patient is here today for a 6 week follow up. Review zio + stress test results. She is still complaining of leg pain. She notices a knot when she rubs on her right calf. This is concerning to her. Has decreased smoking and has also lost weight (notes that she is eating only one meal a day and otherwise eating small snacks like an apple).

## 2021-02-20 DIAGNOSIS — E785 Hyperlipidemia, unspecified: Principal | ICD-10-CM

## 2021-02-22 MED ORDER — EMPTY CONTAINER
2 refills | 0 days
Start: 2021-02-22 — End: ?

## 2021-02-22 NOTE — Unmapped (Signed)
[facility] Specialty Medication Onboarding    Specialty Medication: REPATHA SURECLICK 140 mg/mL Pnij (evolocumab)  Prior Authorization: Approved   Financial Assistance: No - copay  <$25  Final Copay/Day Supply: $0 / 84    Insurance Restrictions: None     Notes to Pharmacist: n/a    The triage team has completed the benefits investigation and has determined that the patient is able to fill this medication at [facility]. Please contact the patient to complete the onboarding or follow up with the prescribing physician as needed.

## 2021-02-22 NOTE — Unmapped (Signed)
[facility]   Patient Onboarding/Medication Counseling    Alyssa Herrera is a 45 y.o. female with hyperlipidemia who I am counseling today on initiation of therapy.  I am speaking to the patient.    Was a Nurse, learning disability used for this call? No    Verified patient's date of birth / HIPAA.    Specialty medication(s) to be sent: General Specialty: Repatha      Non-specialty medications/supplies to be sent: Higher education careers adviser      Medications not needed at this time: n/a         Repatha (evolocumab)    The patient declined counseling on medication administration, missed dose instructions, goals of therapy, side effects and monitoring parameters, warnings and precautions, drug/food interactions and storage, handling precautions, and disposal because they were counseled in clinic. The information in the declined sections below are for informational purposes only and was not discussed with patient.       Medication & Administration     Dosage: Inject the contents of 1 pen (140mg ) under the skin every 2 weeks.    Administration: Administer under the skin of the abdomen, thigh or upper arm. Rotate sites with each injection.  ??? Injection instructions - Autoinjector:  o Remove 1 Repatha autoinjector from the refrigerator and let stand at room temperature for at least 30 minutes.  o Check the autoinjector for the following:  - Expiration date  - Absence of any cracks or damage  - The medicine is clear and colorless and does not contain any particles  - The orange cap is present and securely attached  o Choose your injection site and clean with an alcohol wipe. Allow to air dry completely.  o Pull the orange cap straight off and discard  o Pinch the skin (or stretch) with your thumb and fingers creating an area 2 inches wide  o Maintaining the pinch (or stretch) press the pen to your skin at a 90 degree angle. Firmly push the autoinjector down until the skin stops moving and the yellow safety guard is no longer visible.  o Do not touch the gray start button yet  o When you are ready to inject, press the gray start button. You will hear a click that signals the start of the injection  o Continue to press the pen to your skin and lift your thumb  o The injection may take up to 15 seconds. You will know the injection is complete when the medication window turns yellow. You may also hear a second click.  o Remove the pen from your skin and discard the pen in a sharps container.  o If there is blood at the injection site, press a cotton ball or gauze to the site. Do not rub the injection site.    Adherence/Missed dose instructions: Administer a missed dose within 7 days and resume your normal schedule.  If it has been more than 7 days and you inject every 2 weeks, skip the missed dose and resume your normal schedule..     Goals of Therapy     Lower cholesterol, prevention of cardiovascular events in patients with established cardiovascular disease    Side Effects & Monitoring Parameters   ??? Flu-like symptoms  ??? Signs of a common cold  ??? Back pain  ??? Injection site irritation  ??? Nose or throat irritation    The following side effects should be reported to the provider:  ??? Signs of an allergic reaction  ???  Signs of high blood sugar (confusion, drowsiness, increase thirst/hunger/urination, fast breathing, flushing)      Contraindications, Warnings, & Precautions     ??? Latex (the packaging of Repatha may contain natural rubber)    Drug/Food Interactions     ??? Medication list reviewed in Epic. The patient was instructed to inform the care team before taking any new medications or supplements. No drug interactions identified.     Storage, Handling Precautions, & Disposal   ??? Repatha should be stored in the refrigerator. If necessary, Repatha may be kept at room temperature for no more than 30 days.  ??? Place used devices in a sharps container for disposal.      Current Medications (including OTC/herbals), Comorbidities and Allergies     Current Outpatient Medications   Medication Sig Dispense Refill   ??? acetaminophen (TYLENOL ARTHRITIS ORAL) Take by mouth.     ??? albuterol 2.5 mg /3 mL (0.083 %) nebulizer solution Inhale 3 mL (2.5 mg total) by nebulization Four (4) times a day. 100 mL 3   ??? albuterol HFA 90 mcg/actuation inhaler INHALE 2 PUFFS BY MOUTH EVERY 6 HOURS AS NEEDED FOR WHEEZE 18 g 5   ??? atorvastatin (LIPITOR) 10 MG tablet Take 1 tablet (10 mg total) by mouth daily. 30 tablet 6   ??? cyclobenzaprine (FLEXERIL) 5 MG tablet Take 1-2 tablets (5-10 mg total) by mouth nightly as needed. For pain. 30 tablet 3   ??? diclofenac (VOLTAREN) 50 MG EC tablet Take 1 tablet (50 mg total) by mouth Two (2) times a day. For arthritis pain. 60 tablet 6   ??? ergocalciferol-1,250 mcg, 50,000 unit, (DRISDOL) 1,250 mcg (50,000 unit) capsule Take 1 capsule (1,250 mcg total) by mouth once a week. 12 capsule 2   ??? escitalopram oxalate (LEXAPRO) 10 MG tablet TAKE 1 TABLET (10 MG TOTAL) BY MOUTH DAILY. FOR ANXIETY AND MOOD. 90 tablet 2   ??? evolocumab 140 mg/mL PnIj Inject the contents of one pen (140 mg) under the skin every fourteen (14) days. 6 mL 3   ??? fluticasone propionate (FLOVENT HFA) 110 mcg/actuation inhaler Inhale 2 puffs Two (2) times a day. As needed for cough 12 g 0   ??? furosemide (LASIX) 20 MG tablet Take 1 tablet (20 mg total) by mouth Two (2) times a day. (Patient taking differently: Take 20 mg by mouth in the morning. Takes once daily, and once extra prn.) 180 tablet 1   ??? ipratropium-albuterol (DUO-NEB) 0.5-2.5 mg/3 mL nebulizer Inhale 3 mL every six (6) hours as needed.      ??? linaCLOtide (LINZESS) 145 mcg capsule Take 1 capsule (145 mcg total) by mouth daily. 90 capsule 1   ??? methocarbamoL (ROBAXIN) 750 MG tablet Take 1 tablet (750 mg total) by mouth four (4) times a day. As needed for pain. 60 tablet 1   ??? multivitamin (TAB-A-VITE/THERAGRAN) per tablet Take 1 tablet by mouth daily.     ??? olopatadine (PATADAY) 0.2 % ophthalmic solution Administer 1 drop to both eyes Two (2) times a day.     ??? omeprazole (PRILOSEC) 20 MG capsule Take 20 mg by mouth daily.     ??? propylene glycol (SYSTANE BALANCE OPHT) Apply 1 drop to eye nightly. Both eyes       No current facility-administered medications for this visit.       Allergies   Allergen Reactions   ??? Penicillins Hives and Shortness Of Breath     Can take amoxicillian   ???  Tramadol Hives and Shortness Of Breath     Breathing difficulty   Blurred vision  Chest pain   ??? Pravastatin Muscle Pain   ??? Simvastatin Muscle Pain       Patient Active Problem List   Diagnosis   ??? Anxiety   ??? GERD (gastroesophageal reflux disease)   ??? Obesity   ??? Vitamin D deficiency   ??? Morbid (severe) obesity due to excess calories (CMS-HCC)   ??? Moderate persistent asthma without complication       Reviewed and up to date in Epic.    Appropriateness of Therapy     Acute infections noted within Epic:  No active infections  Patient reported infection: None    Is medication and dose appropriate based on diagnosis and infection status? Yes    Prescription has been clinically reviewed: Yes      Baseline Quality of Life Assessment      How many days over the past month did your hyperlipidemia  keep you from your normal activities? For example, brushing your teeth or getting up in the morning. 0    Financial Information     Medication Assistance provided: Prior Authorization    Anticipated copay of $0 (84 days) reviewed with patient. Verified delivery address.    Delivery Information     Scheduled delivery date: 02/25/21    Expected start date: 02/25/21    Medication will be delivered via Same Day Courier to the prescription address in [facility]  This shipment will not require a signature.      Explained the services we provide at [facility] and that each month we would call to set up refills.  Stressed importance of returning phone calls so that we could ensure they receive their medications in time each month. Informed patient that we should be setting up refills 7-10 days prior to when they will run out of medication.  A pharmacist will reach out to perform a clinical assessment periodically.  Informed patient that a welcome packet, containing information about our pharmacy and other support services, a Notice of Privacy Practices, and a drug information handout will be sent.      The patient or caregiver noted above participated in the development of this care plan and knows that they can request review of or adjustments to the care plan at any time.      Patient or caregiver verbalized understanding of the above information as well as how to contact the pharmacy at [phone] option 4 with any questions/concerns.  The pharmacy is open Monday through Friday 8:30am-4:30pm.  A pharmacist is available 24/7 via pager to answer any clinical questions they may have.    Patient Specific Needs     - Does the patient have any physical, cognitive, or cultural barriers? No    - Does the patient have adequate living arrangements? (i.e. the ability to store and take their medication appropriately) Yes    - Did you identify any home environmental safety or security hazards? No    - Patient prefers to have medications discussed with  Patient     - Is the patient or caregiver able to read and understand education materials at a high school level or above? Yes    - Patient's primary language is  English     - Is the patient high risk? No    - Does the patient require physician intervention or other additional services (i.e. dietary/nutrition, smoking cessation, social work)?  No      Camillo Flaming  [facility] Specialty Pharmacist

## 2021-02-25 MED FILL — EMPTY CONTAINER: 120 days supply | Qty: 1 | Fill #0

## 2021-03-01 NOTE — Unmapped (Signed)
Pt called for triage, no answer. I have left a voicemail with instructions to call office.

## 2021-03-04 ENCOUNTER — Ambulatory Visit: Admit: 2021-03-04 | Discharge: 2021-03-05 | Payer: MEDICARE

## 2021-03-04 NOTE — Unmapped (Signed)
It was good to see you today.    I would like for you to have an anorectal manometry.  Please call GI Procedures to schedule at [phone] option 1 followed by option 2.      I will order a CT enteroclysis to see if you have significant scar tissue from your prior surgeries.      I would like to see you back in GI Clinic in 3 - 4 months.        Brown Human, M.D.  [facility]  Gastroenterology and Hepatology  [phone]  [address]  [place], [place] [address]  Telephone:  [phone]  Fax:  [phone]

## 2021-03-21 ENCOUNTER — Ambulatory Visit: Admit: 2021-03-21 | Discharge: 2021-03-22 | Payer: MEDICARE

## 2021-03-21 ENCOUNTER — Institutional Professional Consult (permissible substitution): Admit: 2021-03-21 | Discharge: 2021-03-22 | Payer: MEDICARE

## 2021-03-21 DIAGNOSIS — M6281 Muscle weakness (generalized): Principal | ICD-10-CM

## 2021-03-21 DIAGNOSIS — M533 Sacrococcygeal disorders, not elsewhere classified: Principal | ICD-10-CM

## 2021-03-21 DIAGNOSIS — M2550 Pain in unspecified joint: Principal | ICD-10-CM

## 2021-03-21 DIAGNOSIS — R209 Unspecified disturbances of skin sensation: Principal | ICD-10-CM

## 2021-03-21 DIAGNOSIS — M549 Dorsalgia, unspecified: Principal | ICD-10-CM

## 2021-03-21 DIAGNOSIS — R799 Abnormal finding of blood chemistry, unspecified: Principal | ICD-10-CM

## 2021-03-21 LAB — C-REACTIVE PROTEIN: C-REACTIVE PROTEIN: 8 mg/L (ref <=10.0)

## 2021-03-21 LAB — SEDIMENTATION RATE: ERYTHROCYTE SEDIMENTATION RATE: 37 mm/h — ABNORMAL HIGH (ref 0–20)

## 2021-03-21 LAB — CBC
HEMATOCRIT: 42 % (ref 34.0–44.0)
HEMOGLOBIN: 14.1 g/dL (ref 11.3–14.9)
MEAN CORPUSCULAR HEMOGLOBIN CONC: 33.6 g/dL (ref 32.0–36.0)
MEAN CORPUSCULAR HEMOGLOBIN: 31.5 pg (ref 25.9–32.4)
MEAN CORPUSCULAR VOLUME: 93.6 fL (ref 77.6–95.7)
MEAN PLATELET VOLUME: 9.4 fL (ref 6.8–10.7)
PLATELET COUNT: 276 10*9/L (ref 150–450)
RED BLOOD CELL COUNT: 4.49 10*12/L (ref 3.95–5.13)
RED CELL DISTRIBUTION WIDTH: 12.7 % (ref 12.2–15.2)
WBC ADJUSTED: 8 10*9/L (ref 3.6–11.2)

## 2021-03-21 LAB — FOLATE: FOLATE: 8.1 ng/mL (ref >=5.4)

## 2021-03-21 LAB — HEMOGLOBIN A1C
ESTIMATED AVERAGE GLUCOSE: 111 mg/dL
HEMOGLOBIN A1C: 5.5 % (ref 4.8–5.6)

## 2021-03-21 LAB — COMPREHENSIVE METABOLIC PANEL
ALBUMIN: 3.7 g/dL (ref 3.4–5.0)
ALKALINE PHOSPHATASE: 58 U/L (ref 46–116)
ALT (SGPT): 16 U/L (ref 10–49)
ANION GAP: 8 mmol/L (ref 5–14)
AST (SGOT): 17 U/L (ref <=34)
BILIRUBIN TOTAL: 0.5 mg/dL (ref 0.3–1.2)
BLOOD UREA NITROGEN: 10 mg/dL (ref 9–23)
BUN / CREAT RATIO: 14
CALCIUM: 9.4 mg/dL (ref 8.7–10.4)
CHLORIDE: 107 mmol/L (ref 98–107)
CO2: 26 mmol/L (ref 20.0–31.0)
CREATININE: 0.69 mg/dL
EGFR CKD-EPI (2021) FEMALE: >90 mL/min/{1.73_m2} (ref >=60)
GLUCOSE RANDOM: 104 mg/dL (ref 70–179)
POTASSIUM: 4.4 mmol/L (ref 3.5–5.1)
PROTEIN TOTAL: 6.7 g/dL (ref 5.7–8.2)
SODIUM: 141 mmol/L (ref 135–145)

## 2021-03-21 LAB — RHEUMATOID FACTOR, QUANT: RHEUMATOID FACTOR: 19.4 [IU]/mL — ABNORMAL HIGH (ref <14.0)

## 2021-03-21 LAB — VITAMIN B12: VITAMIN B-12: 376 pg/mL (ref 211–911)

## 2021-03-21 LAB — TSH: THYROID STIMULATING HORMONE: 0.365 u[IU]/mL — ABNORMAL LOW (ref 0.550–4.780)

## 2021-03-21 MED ADMIN — ketorolac (TORADOL) injection 30 mg: 30 mg | INTRAMUSCULAR | @ 14:00:00 | Stop: 2021-03-21

## 2021-03-21 NOTE — Unmapped (Signed)
The patient reports they are currently: at home. I spent 17 minutes on the phone with the patient on the date of service. I spent an additional 10 minutes on pre- and post-visit activities on the date of service.     The patient was physically located in [place] or a state in which I am permitted to provide care. The patient and/or parent/guardian understood that s/he may incur co-pays and cost sharing, and agreed to the telemedicine visit. The visit was reasonable and appropriate under the circumstances given the patient's presentation at the time.    The patient and/or parent/guardian has been advised of the potential risks and limitations of this mode of treatment (including, but not limited to, the absence of in-person examination) and has agreed to be treated using telemedicine. The patient's/patient's family's questions regarding telemedicine have been answered.     If the visit was completed in an ambulatory setting, the patient and/or parent/guardian has also been advised to contact their provider???s office for worsening conditions, and seek emergency medical treatment and/or call 911 if the patient deems either necessary.           Assessment and Plan:     [name] was seen today for pain.    Diagnoses and all orders for this visit:    Back pain, unspecified back location, unspecified back pain laterality, unspecified chronicity  Traumatic coccydynia       -      Given chronicity of condition, pt's previous hx, referral placed to spine center       -      Pt plans to attend clinic today for IM Toradol injection       -      10/21: Lumbar spine x-ray: anterolisthesis of L4 on L5        -      Gentle activity, stretches. Advance activity as tolerated.              Avoidance of repetitive movements, caution with heavy lifting.              OTC analgesia as needed for pain.              Return to clinic if no improvement, worsening or new symptoms, or patient concerns.   -     ketorolac (TORADOL) injection 30 mg  -     Ambulatory referral to Spine Center; Future    Muscle weakness  Arthralgia, unspecified joint  Skin sensation disturbance                -     Given ongoing symptoms, FH of lupus-blood work today & referral placed        -     Discussed referral time, possible wait/delay for neurology  -     Comprehensive Metabolic Panel  -     CBC  -     TSH  -     Vitamin B12 Level  -     Folate  -     Hemoglobin A1c  -     Anti-Nuclear Antibody (ANA)  -     Rheumatoid Factor, Quantitative  -     C-reactive protein  -     Sedimentation Rate                Barriers to recommended plan: None identified    Return if symptoms worsen or fail to improve, for As scheduled with PCP.  Subjective:     HPI: Alyssa Herrera is a 45 y.o. female here for Pain (Lower back that radiates down both legs, abdominal pain /Pt request toradol injection ).    Back pain:    Runs down back to B legs---ongoing issue for past 2 weeks  Pain running down left leg, hx sciatica  When back pain starts--feels like 'body attacking' itself--usually, these symptoms occur day 2 of back pain flare  Fingers swelling--all 10 fingers swell, big toes-tingling, hands/elbow-'hard to pick up things'  Feels like hands are 'locking up', 'cannot grip things'  Arm, neck/shoulder--stiff   Notices 'ring worm patches' on face  Has tried--epsom salt bath, robaxin, diclofenac, tylenol, topical (volataren, biofreeze)--not helpful  Usually, gets Toradol injection, which helps calm down pain  Feels off balance with symptoms, some weakness in B upper/lower extremities.    Hx of coccyx fracture, had epidural injection--went to [facility]--did not feel injection was helpful  Sister with lupus  MS: step-brother  10/21: Lumbar spine x-ray: anterolisthesis of L4 on L5     Pt willing to come to clinic today for injection and labs      I have reviewed past medical, surgical, medications, allergies, social and family histories today and updated them in Epic where appropriate.    ROS:   Answers for HPI/ROS submitted by the patient on 03/21/2021  Onset: 1 to 4 weeks ago  Frequency: constantly  Progression since onset: rapidly worsening  Pain location: lumbar spine  Pain quality: aching, burning, cramping, shooting, stabbing  Radiates to: left foot, left knee, left thigh, right foot, right knee, right thigh  Pain - numeric: 9/10  Pain is: the same all the time  Aggravated by: bending, position, lying down, sitting, standing, twisting  Stiffness is present: in the morning, all day  abdominal pain: Yes  bladder incontinence: No  bowel incontinence: No  chest pain: No  dysuria: No  fever: No  headaches: Yes  leg pain: Yes  numbness: Yes  paresis: No  paresthesias: Yes  pelvic pain: Yes  perianal numbness: No  tingling: Yes  weakness: Yes  weight loss: No  Risk factors: history of osteoporosis, obesity        Review of systems negative unless otherwise noted as per HPI.      Objective:     There were no vitals filed for this visit.  There is no height or weight on file to calculate BMI.    Physical Exam  Nursing note reviewed.   Pulmonary:      Comments: Speaking with ease  Neurological:      Mental Status: She is alert and oriented to person, place, and time.   Psychiatric:         Mood and Affect: Mood normal.         Behavior: Behavior normal.         Thought Content: Thought content normal.         Judgment: Judgment normal.              Medication adherence and barriers to the treatment plan have been addressed. Opportunities to optimize healthy behaviors have been discussed. Patient / caregiver voiced understanding.   [name], DNP, FNP-C  [facility]  [phone] (F)    Note - This record has been created using [employer]. Chart creation errors have been sought, but may not always have been located. Such creation errors do not reflect on the  standard of medical care.

## 2021-03-22 DIAGNOSIS — R946 Abnormal results of thyroid function studies: Principal | ICD-10-CM

## 2021-03-22 DIAGNOSIS — R7989 Other specified abnormal findings of blood chemistry: Principal | ICD-10-CM

## 2021-03-22 LAB — T4, FREE: FREE T4: 1.32 ng/dL (ref 0.89–1.76)

## 2021-03-22 LAB — T3, FREE: T3 FREE: 3.17 pg/mL (ref 2.30–4.20)

## 2021-03-22 NOTE — Unmapped (Signed)
Hi Breea,    Your thyroid levels were normal.    Thanks,  AM

## 2021-03-22 NOTE — Unmapped (Signed)
Hi Mirabelle,    One of your inflammatory markers and rheumatoid factor are elevated. We are still waiting on additional test for autoimmune conditions.     Your test for diabetes, kidney, liver tests--negative,  Your thyroid test was slightly abnormal--I will add additional tests to investigate sooner.    Thanks,  AM

## 2021-03-25 LAB — ANA: ANTINUCLEAR ANTIBODIES (ANA): NEGATIVE

## 2021-03-26 NOTE — Unmapped (Signed)
Hello,    Last test for autoimmune conditions--negative.    Thanks,  AM

## 2021-04-01 DIAGNOSIS — M5410 Radiculopathy, site unspecified: Principal | ICD-10-CM

## 2021-04-01 MED ORDER — DICLOFENAC SODIUM 50 MG TABLET,DELAYED RELEASE
ORAL_TABLET | Freq: Two times a day (BID) | ORAL | 1 refills | 30 days | Status: CP
Start: 2021-04-01 — End: 2022-04-01

## 2021-04-25 NOTE — Unmapped (Signed)
Seen by patient Alyssa Herrera on 03/27/2021  9:27 AM

## 2021-04-29 NOTE — Unmapped (Signed)
Gastroenterology Return Visit Note      Initial Referring Provider:  Desmond Dike, NP  [address]  [place],  [place] [address]    Primary Care Provider:  Olena Leatherwood, NP       ASSESSMENT/PLAN: Ms. Mercer has GERD, IBS-C and abdominal discomfort.  I would like for her to have cross sectional abdominal imaging to evaluate for transition points in the bowel that may be secondary to adhesive disease.  In addition, I would like for her to have an anorectal manometry to evaluate for pelvic floor dyssynergia which may be contributing to constipation.  I would like to follow-up with her in 3 - 4 months.        HISTORY OF PRESENT ILLNESS: This is a 45 y.o. year old female with gastroesophageal reflux, abdominal discomfort and constipation-predominant irritable bowel syndrome (IBS-C). Her IBS-C is symptomatic on Linzess.  Ms. Pranger has had multiple abdominal surgeries.       CURRENT MEDICATIONS:      Current Outpatient Medications:   ???  acetaminophen (TYLENOL ARTHRITIS ORAL), Take by mouth., Disp: , Rfl:   ???  albuterol 2.5 mg /3 mL (0.083 %) nebulizer solution, Inhale 3 mL (2.5 mg total) by nebulization Four (4) times a day., Disp: 100 mL, Rfl: 3  ???  albuterol HFA 90 mcg/actuation inhaler, INHALE 2 PUFFS BY MOUTH EVERY 6 HOURS AS NEEDED FOR WHEEZE, Disp: 18 g, Rfl: 5  ???  atorvastatin (LIPITOR) 10 MG tablet, Take 1 tablet (10 mg total) by mouth daily., Disp: 30 tablet, Rfl: 6  ???  empty container (SHARPS-A-GATOR DISPOSAL SYSTEM) Misc, Use as directed for sharps disposal, Disp: 1 each, Rfl: 2  ???  ergocalciferol-1,250 mcg, 50,000 unit, (DRISDOL) 1,250 mcg (50,000 unit) capsule, Take 1 capsule (1,250 mcg total) by mouth once a week., Disp: 12 capsule, Rfl: 2  ???  evolocumab 140 mg/mL PnIj, Inject the contents of one pen (140 mg) under the skin every fourteen (14) days., Disp: 6 mL, Rfl: 3  ???  fluticasone propionate (FLOVENT HFA) 110 mcg/actuation inhaler, Inhale 2 puffs Two (2) times a day. As needed for cough, Disp: 12 g, Rfl: 0  ???  ipratropium-albuterol (DUO-NEB) 0.5-2.5 mg/3 mL nebulizer, Inhale 3 mL every six (6) hours as needed. , Disp: , Rfl:   ???  linaCLOtide (LINZESS) 145 mcg capsule, Take 1 capsule (145 mcg total) by mouth daily., Disp: 90 capsule, Rfl: 1  ???  methocarbamoL (ROBAXIN) 750 MG tablet, Take 1 tablet (750 mg total) by mouth four (4) times a day. As needed for pain., Disp: 60 tablet, Rfl: 1  ???  multivitamin (TAB-A-VITE/THERAGRAN) per tablet, Take 1 tablet by mouth daily., Disp: , Rfl:   ???  olopatadine (PATADAY) 0.2 % ophthalmic solution, Administer 1 drop to both eyes Two (2) times a day., Disp: , Rfl:   ???  omeprazole (PRILOSEC) 20 MG capsule, Take 20 mg by mouth daily., Disp: , Rfl:   ???  propylene glycol (SYSTANE BALANCE OPHT), Apply 1 drop to eye nightly. Both eyes, Disp: , Rfl:   ???  diclofenac (VOLTAREN) 50 MG EC tablet, Take 1 tablet (50 mg total) by mouth Two (2) times a day. For arthritis pain., Disp: 60 tablet, Rfl: 1  ???  escitalopram oxalate (LEXAPRO) 10 MG tablet, TAKE 1 TABLET (10 MG TOTAL) BY MOUTH DAILY. FOR ANXIETY AND MOOD., Disp: 90 tablet, Rfl: 2  ???  furosemide (LASIX) 20 MG tablet, Take 1 tablet (20 mg total) by mouth  Two (2) times a day. (Patient taking differently: Take 20 mg by mouth daily. Takes once daily, and once extra prn), Disp: 180 tablet, Rfl: 1    Current Outpatient Medications:   ???  acetaminophen (TYLENOL ARTHRITIS ORAL), Take by mouth., Disp: , Rfl:   ???  albuterol 2.5 mg /3 mL (0.083 %) nebulizer solution, Inhale 3 mL (2.5 mg total) by nebulization Four (4) times a day., Disp: 100 mL, Rfl: 3  ???  albuterol HFA 90 mcg/actuation inhaler, INHALE 2 PUFFS BY MOUTH EVERY 6 HOURS AS NEEDED FOR WHEEZE, Disp: 18 g, Rfl: 5  ???  atorvastatin (LIPITOR) 10 MG tablet, Take 1 tablet (10 mg total) by mouth daily., Disp: 30 tablet, Rfl: 6  ???  empty container (SHARPS-A-GATOR DISPOSAL SYSTEM) Misc, Use as directed for sharps disposal, Disp: 1 each, Rfl: 2  ???  ergocalciferol-1,250 mcg, 50,000 unit, (DRISDOL) 1,250 mcg (50,000 unit) capsule, Take 1 capsule (1,250 mcg total) by mouth once a week., Disp: 12 capsule, Rfl: 2  ???  evolocumab 140 mg/mL PnIj, Inject the contents of one pen (140 mg) under the skin every fourteen (14) days., Disp: 6 mL, Rfl: 3  ???  fluticasone propionate (FLOVENT HFA) 110 mcg/actuation inhaler, Inhale 2 puffs Two (2) times a day. As needed for cough, Disp: 12 g, Rfl: 0  ???  ipratropium-albuterol (DUO-NEB) 0.5-2.5 mg/3 mL nebulizer, Inhale 3 mL every six (6) hours as needed. , Disp: , Rfl:   ???  linaCLOtide (LINZESS) 145 mcg capsule, Take 1 capsule (145 mcg total) by mouth daily., Disp: 90 capsule, Rfl: 1  ???  methocarbamoL (ROBAXIN) 750 MG tablet, Take 1 tablet (750 mg total) by mouth four (4) times a day. As needed for pain., Disp: 60 tablet, Rfl: 1  ???  multivitamin (TAB-A-VITE/THERAGRAN) per tablet, Take 1 tablet by mouth daily., Disp: , Rfl:   ???  olopatadine (PATADAY) 0.2 % ophthalmic solution, Administer 1 drop to both eyes Two (2) times a day., Disp: , Rfl:   ???  omeprazole (PRILOSEC) 20 MG capsule, Take 20 mg by mouth daily., Disp: , Rfl:   ???  propylene glycol (SYSTANE BALANCE OPHT), Apply 1 drop to eye nightly. Both eyes, Disp: , Rfl:   ???  diclofenac (VOLTAREN) 50 MG EC tablet, Take 1 tablet (50 mg total) by mouth Two (2) times a day. For arthritis pain., Disp: 60 tablet, Rfl: 1  ???  escitalopram oxalate (LEXAPRO) 10 MG tablet, TAKE 1 TABLET (10 MG TOTAL) BY MOUTH DAILY. FOR ANXIETY AND MOOD., Disp: 90 tablet, Rfl: 2  ???  furosemide (LASIX) 20 MG tablet, Take 1 tablet (20 mg total) by mouth Two (2) times a day. (Patient taking differently: Take 20 mg by mouth daily. Takes once daily, and once extra prn), Disp: 180 tablet, Rfl: 1       VITAL SIGNS:    BP 128/74  - Ht 165.1 cm (5' 5)  - Wt (!) 132.6 kg (292 lb 4.8 oz)  - LMP 03/01/2002  - BMI 48.64 kg/m??     PHYSICAL EXAM:    Constitutional:   Alert, oriented x 3, no acute distress, well nourished, and well hydrated.   Mental Status: Thought organized, appropriate affect, pleasantly interactive, not anxious appearing.   HEENT:   Conjunctiva clear, anicteric.           Abdomen: Soft, normal bowel sounds, non-distended, non-tender, no organomegaly or masses.     Perianal/Rectal Exam Not performed.  Skin: No rashes, jaundice or skin lesions noted.             DIAGNOSTIC STUDIES:  I have reviewed all pertinent diagnostic studies, including:    Laboratory results    No visits with results within 1 Week(s) from this visit.   Latest known visit with results is:   Office Visit on 01/01/2021   Component Date Value Ref Range Status   ??? Color, UA 01/01/2021 Yellow   Final   ??? Clarity, UA 01/01/2021 Clear   Final   ??? Glucose, UA 01/01/2021 Negative  Negative Final   ??? Bilirubin, UA 01/01/2021 Negative  Negative Final   ??? Ketones, POC 01/01/2021 Negative  Negative Final   ??? Spec Grav, UA 01/01/2021 1.015  1.005 - 1.030 Final   ??? Blood, UA 01/01/2021 Negative  Negative Final   ??? pH, UA 01/01/2021 6.0  5.0 - 9.0 Final   ??? Protein, UA 01/01/2021 Negative  Negative Final   ??? Urobilinogen, UA 01/01/2021 0.2 E.U./dL  Negative (0.2 mg/dL) Final   ??? Leukocytes, UA 01/01/2021 Negative  Negative Final   ??? Nitrite, UA 01/01/2021 Negative  Negative Final   ??? STRIP LOT NUMBER 01/01/2021 .   Final   ??? STRIP LOT EXPIRATION 01/01/2021 .   Final   ??? WBC, Wet Prep 01/01/2021 1   Final   ??? Clue Cells, Wet Prep 01/01/2021 Negative  Negative Final   ??? Trichomonas, Wet Prep 01/01/2021 Negative  Negative Final   ??? Yeast, Wet Prep 01/01/2021 Positive (A)  Negative Final   ??? Urine Culture, Comprehensive 01/01/2021 Mixed Urogenital Flora   Final

## 2021-05-01 ENCOUNTER — Institutional Professional Consult (permissible substitution): Admit: 2021-05-01 | Discharge: 2021-05-02 | Payer: MEDICARE

## 2021-05-01 MED ORDER — DOXYCYCLINE HYCLATE 100 MG CAPSULE
ORAL_CAPSULE | Freq: Two times a day (BID) | ORAL | 0 refills | 7.00000 days | Status: CP
Start: 2021-05-01 — End: ?

## 2021-05-01 MED ORDER — BENZONATATE 100 MG CAPSULE
ORAL_CAPSULE | Freq: Four times a day (QID) | ORAL | 1 refills | 8.00000 days | Status: CP | PRN
Start: 2021-05-01 — End: 2022-05-01

## 2021-05-01 MED ORDER — FLUCONAZOLE 150 MG TABLET
ORAL_TABLET | Freq: Once | ORAL | 0 refills | 1 days | Status: CP
Start: 2021-05-01 — End: 2021-05-01

## 2021-05-01 MED ORDER — ALBUTEROL SULFATE 2.5 MG/3 ML (0.083 %) SOLUTION FOR NEBULIZATION
Freq: Four times a day (QID) | RESPIRATORY_TRACT | 3 refills | 9.00000 days | Status: CP
Start: 2021-05-01 — End: ?

## 2021-05-02 NOTE — Unmapped (Signed)
Addended by: Eliberto Ivory on: 05/01/2021 04:40 PM     Modules accepted: Orders

## 2021-05-02 NOTE — Unmapped (Addendum)
[facility] Psychiatric ENT Encounter  This medical encounter was conducted virtually using [email or web address]  [email or web address] protocols.    Patient ID: Alyssa Herrera is a 45 y.o. female who presents by video interaction for evaluation.    I have identified myself to the patient and conveyed my credentials to Alyssa Herrera.   Patient has signed informed consent on file in medical record.    Present on Video Call: Is there someone else in the room? No..    Assessment/Plan:    Diagnoses and all orders for this visit:    Sinusitis, unspecified chronicity, unspecified location  -     doxycycline (VIBRAMYCIN) 100 MG capsule; Take 1 capsule (100 mg total) by mouth Two (2) times a day.    Moderate persistent asthma without complication  -     albuterol 2.5 mg /3 mL (0.083 %) nebulizer solution; Inhale 3 mL (2.5 mg total) by nebulization four (4) times a day.    Other orders  -     benzonatate (TESSALON PERLES) 100 MG capsule; Take 1 capsule (100 mg total) by mouth every six (6) hours as needed for cough.  -     fluconazole (DIFLUCAN) 150 MG tablet; Take 1 tablet (150 mg total) by mouth once for 1 dose.         -- Discussed the new prescription noted above, including potential side effects, drug interactions, instructions for taking the medication, and the consequences of not taking it.  -- Patient verbalized an understanding of today's assessment and recommendations, as well as the purpose of ongoing medications.    Referred back to PCP for follow up in 7 days prn    Medication adherence and barriers to the treatment plan have been addressed. Opportunities to optimize healthy behaviors have been discussed. Patient / caregiver voiced understanding.        Subjective:     HPI  Alyssa Herrera is 45 y.o. and presents today in the [facility] with ENT symptoms.  The PCP for this patient is Olena Leatherwood, NP. Pt with cough, sinus congestion, chest pain with coughing only, + post nasal drip, hoarseness.  Pt with negative COVID test.  Sx onset a week+ ago.  Worsened the past few days.  No sob.  No fever. OTC meds not helping much.Pt also asking for a refill on her nebulizer and a pill for yeast infection-gets when takes abx.         ROS  Review of Systems     All other ROS per HPI.    I have reviewed the problem list, past medical history, past family history, medications, and allergies and have updated/reconciled them if needed.          Objective:   Physical Exam  As part of this Video Visit, no in-person exam was conducted.  Video interaction permitted the following observations.    General: No acute distress.   HEENT: OP clear.  Uvula midline.  No visible mass or abnormality of neck.   RESP: Relaxed respiratory effort. No conversational dyspnea.   SKIN: No rashes noted.  NEURO: Normal coordination.  No tremors observed.  PSYCH: Alert and oriented.  Speech fluent and sensible.  Calm affect.             The patient reports they are currently: at home. I spent 13 minutes on the phone with the patient on the date of service. I spent an additional 6 minutes on pre- and post-visit activities on  the date of service.     The patient was physically located in [place] or a state in which I am permitted to provide care. The patient and/or parent/guardian understood that s/he may incur co-pays and cost sharing, and agreed to the telemedicine visit. The visit was reasonable and appropriate under the circumstances given the patient's presentation at the time.    The patient and/or parent/guardian has been advised of the potential risks and limitations of this mode of treatment (including, but not limited to, the absence of in-person examination) and has agreed to be treated using telemedicine. The patient's/patient's family's questions regarding telemedicine have been answered.     If the visit was completed in an ambulatory setting, the patient and/or parent/guardian has also been advised to contact their provider???s office for worsening conditions, and seek emergency medical treatment and/or call 911 if the patient deems either necessary.

## 2021-05-02 NOTE — Unmapped (Signed)
Addended by: Eliberto Ivory on: 05/01/2021 04:39 PM     Modules accepted: Orders

## 2021-05-03 NOTE — Unmapped (Signed)
Called pt about her anorectal procedure appointment for today, if she is can come early; pt states she is feeling sick and wants to reschedule procedure . Pt procedure rescheduled to 05/31/2021 at 2.30 pm, as requested by pt

## 2021-05-07 NOTE — Unmapped (Signed)
[facility] Clinical Assessment & Refill Coordination Note    Alyssa Herrera, DOB: 1976/05/07  Phone: There are no phone numbers on file.    All above HIPAA information was verified with patient.     Was a Nurse, learning disability used for this call? No    Specialty Medication(s):   General Specialty: Repatha     Current Outpatient Medications   Medication Sig Dispense Refill   ??? acetaminophen (TYLENOL ARTHRITIS ORAL) Take by mouth.     ??? albuterol 2.5 mg /3 mL (0.083 %) nebulizer solution Inhale 3 mL (2.5 mg total) by nebulization four (4) times a day. 100 mL 3   ??? albuterol HFA 90 mcg/actuation inhaler INHALE 2 PUFFS BY MOUTH EVERY 6 HOURS AS NEEDED FOR WHEEZE 18 g 5   ??? atorvastatin (LIPITOR) 10 MG tablet Take 1 tablet (10 mg total) by mouth daily. 30 tablet 6   ??? benzonatate (TESSALON PERLES) 100 MG capsule Take 1 capsule (100 mg total) by mouth every six (6) hours as needed for cough. 30 capsule 1   ??? diclofenac (VOLTAREN) 50 MG EC tablet Take 1 tablet (50 mg total) by mouth Two (2) times a day. For arthritis pain. 60 tablet 1   ??? doxycycline (VIBRAMYCIN) 100 MG capsule Take 1 capsule (100 mg total) by mouth Two (2) times a day. 14 capsule 0   ??? empty container (SHARPS-A-GATOR DISPOSAL SYSTEM) Misc Use as directed for sharps disposal 1 each 2   ??? ergocalciferol-1,250 mcg, 50,000 unit, (DRISDOL) 1,250 mcg (50,000 unit) capsule Take 1 capsule (1,250 mcg total) by mouth once a week. 12 capsule 2   ??? escitalopram oxalate (LEXAPRO) 10 MG tablet TAKE 1 TABLET (10 MG TOTAL) BY MOUTH DAILY. FOR ANXIETY AND MOOD. 90 tablet 2   ??? evolocumab 140 mg/mL PnIj Inject the contents of one pen (140 mg) under the skin every fourteen (14) days. 6 mL 3   ??? fluticasone propionate (FLOVENT HFA) 110 mcg/actuation inhaler Inhale 2 puffs Two (2) times a day. As needed for cough 12 g 0   ??? furosemide (LASIX) 20 MG tablet Take 1 tablet (20 mg total) by mouth Two (2) times a day. (Patient taking differently: Take 20 mg by mouth daily. Takes once daily, and once extra prn) 180 tablet 1   ??? ipratropium-albuterol (DUO-NEB) 0.5-2.5 mg/3 mL nebulizer Inhale 3 mL every six (6) hours as needed.      ??? linaCLOtide (LINZESS) 145 mcg capsule Take 1 capsule (145 mcg total) by mouth daily. 90 capsule 1   ??? methocarbamoL (ROBAXIN) 750 MG tablet Take 1 tablet (750 mg total) by mouth four (4) times a day. As needed for pain. 60 tablet 1   ??? multivitamin (TAB-A-VITE/THERAGRAN) per tablet Take 1 tablet by mouth daily.     ??? olopatadine (PATADAY) 0.2 % ophthalmic solution Administer 1 drop to both eyes Two (2) times a day.     ??? omeprazole (PRILOSEC) 20 MG capsule Take 20 mg by mouth daily.     ??? propylene glycol (SYSTANE BALANCE OPHT) Apply 1 drop to eye nightly. Both eyes       No current facility-administered medications for this visit.        Changes to medications: Alyssa Herrera reports no changes at this time.    Allergies   Allergen Reactions   ??? Penicillins Hives and Shortness Of Breath     Can take amoxicillian   ??? Tramadol Hives and Shortness Of Breath  Breathing difficulty   Blurred vision  Chest pain   ??? Pravastatin Muscle Pain   ??? Simvastatin Muscle Pain       Changes to allergies: No    SPECIALTY MEDICATION ADHERENCE     Repatha 140 mg/ml: 14 days of medicine on hand       Medication Adherence    Patient reported X missed doses in the last month: 0  Specialty Medication: Repatha 140 mg/mL  Informant: patient          Specialty medication(s) dose(s) confirmed: Regimen is correct and unchanged.     Are there any concerns with adherence? No    Adherence counseling provided? Not needed    CLINICAL MANAGEMENT AND INTERVENTION      Clinical Benefit Assessment:    Do you feel the medicine is effective or helping your condition? Yes    Clinical Benefit counseling provided? Not needed    Adverse Effects Assessment:    Are you experiencing any side effects? No. Alyssa Herrera has been experiencing nightly leg cramps and shooting pains down one leg. She says it feels similar to sciatica that she has experienced before. She plans to follow up with ortho this week.     Are you experiencing difficulty administering your medicine? No    Quality of Life Assessment:            How many days over the past month did your hyperlipidemia  keep you from your normal activities? For example, brushing your teeth or getting up in the morning. 0    Have you discussed this with your provider? Not needed    Acute Infection Status:    Acute infections noted within Epic:  No active infections  Patient reported infection: None    Therapy Appropriateness:    Is therapy appropriate and patient progressing towards therapeutic goals? Yes, therapy is appropriate and should be continued    DISEASE/MEDICATION-SPECIFIC INFORMATION      For patients on injectable medications: Patient currently has 1 doses left.  Next injection is scheduled for 05/07/21.    PATIENT SPECIFIC NEEDS     - Does the patient have any physical, cognitive, or cultural barriers? No    - Is the patient high risk? No    - Does the patient require a Care Management Plan? No     - Does the patient require physician intervention or other additional services (i.e. nutrition, smoking cessation, social work)? No      SHIPPING     Specialty Medication(s) to be Shipped:   General Specialty: Repatha    Other medication(s) to be shipped: No additional medications requested for fill at this time     Changes to insurance: No    Delivery Scheduled: Yes, Expected medication delivery date: 05/16/21.     Medication will be delivered via Same Day Courier to the confirmed prescription address in [facility].    The patient will receive a drug information handout for each medication shipped and additional FDA Medication Guides as required.  Verified that patient has previously received a Conservation officer, historic buildings and a Surveyor, mining.    The patient or caregiver noted above participated in the development of this care plan and knows that they can request review of or adjustments to the care plan at any time.      All of the patient's questions and concerns have been addressed.    Camillo Flaming   [facility] Specialty Pharmacist

## 2021-05-15 ENCOUNTER — Ambulatory Visit: Admit: 2021-05-15 | Discharge: 2021-05-16 | Payer: MEDICARE

## 2021-05-15 DIAGNOSIS — G8929 Other chronic pain: Principal | ICD-10-CM

## 2021-05-15 DIAGNOSIS — I2584 Coronary atherosclerosis due to calcified coronary lesion: Principal | ICD-10-CM

## 2021-05-15 DIAGNOSIS — I2510 Atherosclerotic heart disease of native coronary artery without angina pectoris: Principal | ICD-10-CM

## 2021-05-15 DIAGNOSIS — K219 Gastro-esophageal reflux disease without esophagitis: Principal | ICD-10-CM

## 2021-05-15 DIAGNOSIS — M549 Dorsalgia, unspecified: Principal | ICD-10-CM

## 2021-05-15 DIAGNOSIS — K589 Irritable bowel syndrome without diarrhea: Principal | ICD-10-CM

## 2021-05-15 DIAGNOSIS — E785 Hyperlipidemia, unspecified: Principal | ICD-10-CM

## 2021-05-15 MED ORDER — LIDOCAINE 5 % TOPICAL PATCH
MEDICATED_PATCH | Freq: Two times a day (BID) | TRANSDERMAL | 1 refills | 15 days | Status: CP
Start: 2021-05-15 — End: 2022-05-15

## 2021-05-15 MED ADMIN — ketorolac (TORADOL) injection 30 mg: 30 mg | INTRAMUSCULAR | @ 16:00:00 | Stop: 2021-05-15
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
## 2021-05-16 MED FILL — REPATHA SURECLICK 140 MG/ML SUBCUTANEOUS PEN INJECTOR: SUBCUTANEOUS | 84 days supply | Qty: 6 | Fill #1

## 2021-05-17 NOTE — Unmapped (Signed)
PA filed and approved for Lidocaine 5% patches

## 2021-05-20 NOTE — Unmapped (Unsigned)
DIVISION OF CARDIOLOGY   [facility] [place], [place]                                                                         Date of Service:  05/21/2021     Assessment/Plan   1. Palpitations  Patient with PVC noted during triggered events while wearing monitor.  We talked about preventative strategies mostly through lifestyle modifications to prevent palpitations.    2. Coronary artery calcification  PET perfusion study showed coronary artery calcifications even though there is no ischemic burden.  I recommended standard treatment for stable coronary artery disease.  She will remain on her aspirin and will attempt low-dose atorvastatin with addition of Repatha with LDL goal of less than 70.    3. Hyperlipidemia, unspecified hyperlipidemia type  As described above.  - evolocumab 140 mg/mL PnIj; Inject the contents of one pen (140 mg) under the skin every fourteen (14) days.  Dispense: 6 mL; Refill: 3    Return to clinic:  No follow-ups on file.    I personally spent *** minutes face-to-face and non-face-to-face in the care of this patient, which includes all pre, intra, and post visit time on the date of service.     Subjective:   Alyssa D Nile Dear, NP  Chief complaint:  45 y.o. female with a history of GERD, obesity, PTSD, depression and anxiety  is referred by Desmond Dike, NP for evaluation of chest pain and SOB.    History of Present Illness:  Patient saw her PCP on 08/13/20 endorsing episodes of chest wall pain and SOB. Echo was ordered which showed LVEF 55%, and normal RV systolic function. Patient was seen in clinic by me on 09/28/20 to establish care and reported episodes of chest pain that have been ongoing for the prior 6 months. Episodes start out with sensation of a hot flash and a gradual squeezing sensation in her chest with radiation into her neck, racing heart beats, and occasional shortness of breath, dizziness, and diaphoresis. She was tried on crestor in the past, but unable to tolerate. Started ASA 81mg . I also started patient on pravastatin 40mg  on 11/29/20. Stress test showed no ischemia or scar. Ziopatch showed SR with one short episode of SVT. Patient was last seen in clinic by me 02/19/21 and reported myalgias with taking simvastatin and pravastatin. Started evolocumab 140 mg/mL every 2 weeks. Patient is scheduled for anorectal manometry on 05/31/21.    ***  Patient takes ASA daily. Patient has been cutting back on unhealthy foods. She has lost ~15 lbs.  Endorses tobacco use, 1/2 ppd. Started smoking at the age of 53, previously 4 ppd. Previously tried on chantix and nicotine patches. Mother had heart attack at age of 45, with history of atrial fibrillation. Sister had a heart attack at 20, and history of strokes. Lives with 45 year old granddaughter. Has two children.     Past Medical History  Patient Active Problem List   Diagnosis   ??? Anxiety   ??? GERD (gastroesophageal reflux disease)   ??? Obesity   ??? Vitamin D deficiency   ??? Morbid (severe) obesity due to excess calories (CMS-HCC)   ??? Moderate persistent asthma without complication   ???  Asthma   ??? Arthralgia   ??? Coronary artery calcification   ??? Degeneration of intervertebral disc   ??? Hyperlipidemia   ??? Irritable bowel syndrome   ??? Low back pain   ??? Mitral valve disorder   ??? Nicotine dependence, uncomplicated   ??? Other seasonal allergic rhinitis   ??? Other chronic pain       Medications:  Current Outpatient Medications   Medication Sig Dispense Refill   ??? acetaminophen (TYLENOL ARTHRITIS ORAL) Take by mouth.     ??? albuterol 2.5 mg /3 mL (0.083 %) nebulizer solution Inhale 3 mL (2.5 mg total) by nebulization four (4) times a day. 100 mL 3   ??? albuterol HFA 90 mcg/actuation inhaler INHALE 2 PUFFS BY MOUTH EVERY 6 HOURS AS NEEDED FOR WHEEZE 18 g 5   ??? atorvastatin (LIPITOR) 10 MG tablet Take 1 tablet (10 mg total) by mouth daily. 30 tablet 6   ??? benzonatate (TESSALON PERLES) 100 MG capsule Take 1 capsule (100 mg total) by mouth every six (6) hours as needed for cough. 30 capsule 1   ??? diclofenac (VOLTAREN) 50 MG EC tablet Take 1 tablet (50 mg total) by mouth Two (2) times a day. For arthritis pain. 60 tablet 1   ??? doxycycline (VIBRAMYCIN) 100 MG capsule Take 1 capsule (100 mg total) by mouth Two (2) times a day. 14 capsule 0   ??? empty container (SHARPS-A-GATOR DISPOSAL SYSTEM) Misc Use as directed for sharps disposal 1 each 2   ??? ergocalciferol-1,250 mcg, 50,000 unit, (DRISDOL) 1,250 mcg (50,000 unit) capsule Take 1 capsule (1,250 mcg total) by mouth once a week. 12 capsule 2   ??? escitalopram oxalate (LEXAPRO) 10 MG tablet TAKE 1 TABLET (10 MG TOTAL) BY MOUTH DAILY. FOR ANXIETY AND MOOD. 90 tablet 2   ??? evolocumab 140 mg/mL PnIj Inject the contents of one pen (140 mg) under the skin every fourteen (14) days. 6 mL 3   ??? fluticasone propionate (FLOVENT HFA) 110 mcg/actuation inhaler Inhale 2 puffs Two (2) times a day. As needed for cough 12 g 0   ??? furosemide (LASIX) 20 MG tablet Take 1 tablet (20 mg total) by mouth Two (2) times a day. (Patient taking differently: Take 20 mg by mouth daily. Takes once daily, and once extra prn) 180 tablet 1   ??? ipratropium-albuterol (DUO-NEB) 0.5-2.5 mg/3 mL nebulizer Inhale 3 mL every six (6) hours as needed.      ??? lidocaine (LIDODERM) 5 % patch Place 1 patch on the skin every twelve (12) hours. Apply to affected area for 12 hours only each day (then remove patch) 30 patch 1   ??? methocarbamoL (ROBAXIN) 750 MG tablet Take 1 tablet (750 mg total) by mouth four (4) times a day. As needed for pain. 60 tablet 1   ??? multivitamin (TAB-A-VITE/THERAGRAN) per tablet Take 1 tablet by mouth daily.     ??? olopatadine (PATADAY) 0.2 % ophthalmic solution Administer 1 drop to both eyes Two (2) times a day.     ??? omeprazole (PRILOSEC) 20 MG capsule Take 20 mg by mouth daily.     ??? propylene glycol (SYSTANE BALANCE OPHT) Apply 1 drop to eye nightly. Both eyes       No current facility-administered medications for this visit. Allergies  Allergies   Allergen Reactions   ??? Penicillins Hives and Shortness Of Breath     Can take amoxicillian   ??? Tramadol Hives and Shortness Of Breath  Breathing difficulty   Blurred vision  Chest pain   ??? Pravastatin Muscle Pain   ??? Simvastatin Muscle Pain       Social History:   Social History     Tobacco Use   ??? Smoking status: Every Day     Packs/day: 0.30     Years: 14.00     Pack years: 4.20     Types: Cigarettes   ??? Smokeless tobacco: Never   ??? Tobacco comments:     Working on [employer] - 1PPD   Vaping Use   ??? Vaping Use: Never used   Substance Use Topics   ??? Alcohol use: No     Alcohol/week: 0.0 standard drinks   ??? Drug use: Yes     Types: Marijuana     Comment: 1-3x monthly        Family History:  Family History   Problem Relation Age of Onset   ??? Colorectal Cancer Father    ??? Arthritis Father    ??? Diabetes Father    ??? Drug abuse Father    ??? Heart disease Father    ??? Hypertension Father    ??? Stroke Father    ??? Clotting disorder Father    ??? Ulcers Father    ??? Colorectal Cancer Paternal Uncle    ??? Ovarian cancer Mother    ??? Arthritis Mother    ??? Asthma Mother    ??? Cancer Mother         Uteral   ??? COPD Mother    ??? Depression Mother    ??? Diabetes Mother    ??? Heart disease Mother    ??? Hyperlipidemia Mother    ??? Hypertension Mother    ??? Kidney disease Mother    ??? Mental illness Mother    ??? Stroke Mother    ??? Vision loss Mother         Due to massive heart attack   ??? Clotting disorder Mother    ??? Liver disease Mother    ??? Ulcers Mother    ??? Glaucoma Mother    ??? Breast cancer Paternal Grandmother    ??? Asthma Sister    ??? Depression Sister    ??? Diabetes Sister    ??? Heart disease Sister    ??? Hypertension Sister    ??? Stroke Sister    ??? Birth defects Sister         Prime   ??? Clotting disorder Sister    ??? Ulcers Sister    ??? Cancer Maternal Grandmother         Breast   ??? Early death Sister    ??? Early death Brother    ??? Learning disabilities Son    ??? Learning disabilities Son    ??? Aneurysm Paternal Grandfather ROS- 12 system review is negative other than what is specified in the History of Present Illness.      Objective:   Physical Exam  There were no vitals filed for this visit.   General-  Obese appearing female in no apparent distress.  Neurologic- Alert and oriented X3.  Cranial nerve II-XII grossly intact.  HEENT-  Normocephalic atraumatic head.  No scleral icterus.  Wearing face mask.  Psych- Normal mood, appropriate.    Exam was deferred due to counseling.     Laboratory data:    I have personally reviewed the images of the following diagnostic studies.      Electrocardiogram:  From 09/28/20 showed SR,  borderline low voltage.    Echocardiogram:  From 06/2015 at [facility] showed LV systolic function and size were normal. Estimated EF 60-65%. Wall motion was normal; there were no regional wall motion abnormalities. Left ventricular diastolic function parameters were normal. LA normal in size. RV systolic function was normal. Pulmonary artery systolic pressure was within the normal range.     From 09/21/20 showed LV upper normal in size with normal wall thickness, LVEF 55%, normal RV systolic function, no significant valvular abnormalities.    Nuclear stress test:  PET CT stress test showed 10/11/20 showed normal myocardial perfusion study. No evidence of any significant ischemia or scar. Left ventricular systolic function is normal. Post stress the ejection fraction is > 60%. Minimal coronary calcifications are noted. Status post cholecystectomy.    Cardiac monitor:  From 09/28/20 showed patient had a min HR of 48 bpm, max HR of 169 bpm, and avg HR of 80 bpm. Predominant underlying rhythm was Sinus Rhythm. 1 run of Supraventricular Tachycardia occurred lasting 4 beats with a max rate of 169 bpm (avg 150 bpm). Isolated SVEs were rare (<1.0%), SVE Couplets were rare (<1.0%), and SVE Triplets were rare (<1.0%). Isolated VEs were rare (<1.0%), VE Couplets were rare (<1.0%), and no VE Triplets were Present. There were 55 patient triggered events. ??49 of the episodes corresponded to normal sinus rhythm. ??6 episodes corresponded to isolated PVCs.    Lipid panel:  Component      Latest Ref Rng & Units 09/28/2020   Triglycerides      0 - 150 mg/dL 027 (H)   Cholesterol      <=200 mg/dL 253 (H)   HDL      40 - 60 mg/dL 61 (H)   LDL calculated      40 - 99 mg/dL 664 (H)   VLDL Cholesterol Cal      9 - 37 mg/dL 40.3   Chol/HDL Ratio      1.0 - 4.5 4.2   Non-HDL Cholesterol      70 - 474 mg/dL 259 (H)   FASTING       Unknown     Component      Latest Ref Rng & Units 09/28/2020   LDL Direct      mg/dL 563.8     Documentation assistance was provided by Halina Maidens, Scribe on May 20, 2021 at 1:14 PM for Joneen Roach, MD.     I have reviewed the documentation provided by the scribe and confirm that it accurately reflects the service I personally performed and the decisions made by me.  Signature: CSD  Date: 05/21/2021  Time: 1:14 PM

## 2021-06-13 DIAGNOSIS — K581 Irritable bowel syndrome with constipation: Principal | ICD-10-CM

## 2021-06-13 MED ORDER — LINZESS 145 MCG CAPSULE
ORAL_CAPSULE | 1 refills | 0 days | Status: CP
Start: 2021-06-13 — End: ?

## 2021-06-13 NOTE — Unmapped (Signed)
Patient is requesting the following refill  Requested Prescriptions     Pending Prescriptions Disp Refills   ??? LINZESS 145 mcg capsule [Pharmacy Med Name: [name] 145 MCG CAPSULE] 90 capsule 1     Sig: TAKE 1 CAPSULE BY MOUTH EVERY DAY       Recent Visits  Date Type Provider Dept   05/15/21 Office Visit [name], FNP [place] [facility]   01/01/21 Office Visit [name], FNP [place] [facility]   11/13/20 Office Visit Desmond Dike, NP [place] [facility]   08/13/20 Office Visit Desmond Dike, NP [place] [facility]   Showing recent visits within past 365 days with a meds authorizing provider and meeting all other requirements  Future Appointments  Date Type Provider Dept   09/13/21 Appointment Desmond Dike, NP [place] [facility]   Showing future appointments within next 365 days with a meds authorizing provider and meeting all other requirements       For Alyssa Herrera.  Found under history last filled 05/09/20 for #90 and 1 refill.

## 2021-06-24 ENCOUNTER — Ambulatory Visit: Admit: 2021-06-24 | Discharge: 2021-06-25 | Payer: MEDICARE

## 2021-06-24 DIAGNOSIS — G8929 Other chronic pain: Principal | ICD-10-CM

## 2021-06-24 DIAGNOSIS — M5441 Lumbago with sciatica, right side: Principal | ICD-10-CM

## 2021-06-24 DIAGNOSIS — M5442 Lumbago with sciatica, left side: Principal | ICD-10-CM

## 2021-06-24 NOTE — Unmapped (Signed)
[facility] New Patient Evaluation    Patient Name:Alyssa Herrera  MRN: [id number]  DOB: 1975-08-17  Age: 46 y.o.   Date: 06/24/2021  Attending Physician: Tula Nakayama, ANP    ASSESSMENT:     Pt is a 46 y.o. year old female with chronic low back pain with discogenic component, grade 1 listhesis L4-5, no red flags    PLAN:     Provided referral for physical therapy to [facility]  Consider trial of Cymbalta.  Previously discussed with her PCP but she is willing to try again.  She reports that she is not really taking her Lexapro.  Continue diclofenac per PCP  Continue smoking cessation and weight loss efforts    - Return in about 2 months (around 08/22/2021).    SUBJECTIVE:     Chief complaint: Back Pain and Leg Pain      History of present Illness:  Pt is a 46 y.o. year old female seen as a new evaluation for Low back pain bilateral lower extremity pain.  Briefly this is a patient with PMH significant for obesity, anxiety, depression, irritable bowel syndrome, nicotine dependence, and hyperlipidemia who has previously been evaluated at the spine center for chronic lower back pain and lower extremity pain.  She reports that she has had pain since approximately 2018 or just before that with gradual onset.  She denies accident or injury to her lower back.  She does endorse a fall in 2021 sustaining injury to her tailbone but that pain has improved.  She describes her pain as constant.  She rates her pain an 8 out of 10.  She reports its difficult to sit for long periods, stand, walk, climb stairs, or exercise did her pain.  She reports that she has had what sounds like ESI's in the past and is not interested in pursuing any additional injection based treatments.  She is also had some medication trials including gabapentin which caused reportedly a seizure.  She is currently taking diclofenac which is somewhat helpful.  She is also participating in an exercise program on her own and doing some water aerobics which has been helpful.  She was previously referred to physical therapy but has not been recently.  She denies focal weakness into her lower extremities, bowel or bladder dysfunction.  She reports that in general her lower extremity symptoms seem worse than her lower back pain these days.       We reviewed extensive past medical records today.     This visit does not involve [employer]???[employer] Compensation.    Current Outpatient Medications   Medication Sig Dispense Refill   ??? acetaminophen (TYLENOL ARTHRITIS ORAL) Take by mouth.     ??? albuterol 2.5 mg /3 mL (0.083 %) nebulizer solution Inhale 3 mL (2.5 mg total) by nebulization four (4) times a day. 100 mL 3   ??? albuterol HFA 90 mcg/actuation inhaler INHALE 2 PUFFS BY MOUTH EVERY 6 HOURS AS NEEDED FOR WHEEZE 18 g 5   ??? diclofenac (VOLTAREN) 50 MG EC tablet Take 1 tablet (50 mg total) by mouth Two (2) times a day. For arthritis pain. 60 tablet 1   ??? empty container (SHARPS-A-GATOR DISPOSAL SYSTEM) Misc Use as directed for sharps disposal 1 each 2   ??? ergocalciferol-1,250 mcg, 50,000 unit, (DRISDOL) 1,250 mcg (50,000 unit) capsule Take 1 capsule (1,250 mcg total) by mouth once a week. 12 capsule 2   ??? evolocumab 140 mg/mL PnIj Inject the contents of one pen (  140 mg) under the skin every fourteen (14) days. 6 mL 3   ??? fluticasone propionate (FLOVENT HFA) 110 mcg/actuation inhaler Inhale 2 puffs Two (2) times a day. As needed for cough 12 g 0   ??? ipratropium-albuterol (DUO-NEB) 0.5-2.5 mg/3 mL nebulizer Inhale 3 mL every six (6) hours as needed.      ??? LINZESS 145 mcg capsule TAKE 1 CAPSULE BY MOUTH EVERY DAY 90 capsule 1   ??? methocarbamoL (ROBAXIN) 750 MG tablet Take 1 tablet (750 mg total) by mouth four (4) times a day. As needed for pain. 60 tablet 1   ??? multivitamin (TAB-A-VITE/THERAGRAN) per tablet Take 1 tablet by mouth daily.     ??? olopatadine (PATADAY) 0.2 % ophthalmic solution Administer 1 drop to both eyes Two (2) times a day.     ??? omeprazole (PRILOSEC) 20 MG capsule Take 20 mg by mouth daily.     ??? propylene glycol (SYSTANE BALANCE OPHT) Apply 1 drop to eye nightly. Both eyes     ??? atorvastatin (LIPITOR) 10 MG tablet Take 1 tablet (10 mg total) by mouth daily. 30 tablet 6   ??? doxycycline (VIBRAMYCIN) 100 MG capsule Take 1 capsule (100 mg total) by mouth Two (2) times a day. (Patient not taking: Reported on 06/24/2021) 14 capsule 0   ??? escitalopram oxalate (LEXAPRO) 10 MG tablet TAKE 1 TABLET (10 MG TOTAL) BY MOUTH DAILY. FOR ANXIETY AND MOOD. 90 tablet 2   ??? furosemide (LASIX) 20 MG tablet Take 1 tablet (20 mg total) by mouth Two (2) times a day. (Patient taking differently: Take 20 mg by mouth daily. Takes once daily, and once extra prn) 180 tablet 1     No current facility-administered medications for this visit.       Allergies:   Penicillins, Tramadol, Gabapentin, Pravastatin, and Simvastatin    Past Medical / Surgical History:   Past Medical History:   Diagnosis Date   ??? ADD (attention deficit disorder)    ??? Aneurysm (CMS-HCC)     cerebral   ??? Anxiety    ??? Asthma    ??? At risk for falls     off balance, arthritis (hips, knees, feet) - last fall 02/2020   ??? Borderline diabetes    ??? Caregiver burden     66 yo granddaughter    ??? Cerebrovascular disease    ??? Constipation    ??? COPD (chronic obstructive pulmonary disease) (CMS-HCC) 08/05/2010   ??? DDD (degenerative disc disease), cervical    ??? Depression    ??? Financial difficulties     house repairs, dental work   ??? GERD (gastroesophageal reflux disease)    ??? Hiatal hernia    ??? Hyperlipidemia    ??? Hypertension    ??? IBS (irritable bowel syndrome)    ??? Impaired mobility     off balance, arthritis (hips, knees, feet)   ??? Irregular heartbeat    ??? Moderate asthma    ??? MVP (mitral valve prolapse)    ??? Obesity    ??? OCD (obsessive compulsive disorder)    ??? Pancreatitis    ??? Peptic ulceration    ??? Seizures (CMS-HCC)     last episode last week, focal with right facial droop/right arm weakness   ??? Stroke (CMS-HCC) 2016    right arm weakness when she gets tired   ??? Visual impairment     contacts   ??? Vitamin D deficiency        Past Surgical  History:   Procedure Laterality Date   ??? CARDIAC CATHETERIZATION  2009    2015   ??? CESAREAN SECTION  06/22/1996/09/07/98    Birth of 3 sons   ??? CHOLECYSTECTOMY     ??? COLONOSCOPY     ??? HYSTERECTOMY     ??? OOPHORECTOMY     ??? PR COLONOSCOPY W/BIOPSY SINGLE/MULTIPLE N/A 11/13/2016    Procedure: COLONOSCOPY, FLEXIBLE, PROXIMAL TO SPLENIC FLEXURE; WITH BIOPSY, SINGLE OR MULTIPLE;  Surgeon: Toney Reil, MD;  [facility];  Service: Gastroenterology   ??? PR COLONOSCOPY W/BIOPSY SINGLE/MULTIPLE N/A 12/03/2018    Procedure: ESOPHAGOGASTRODUODENOSCOPY WITH BIOPSY;  Surgeon: Laretta Bolster, MD;  Location: [facility] [place];  Service: Gastroenterology   ??? TONSILLECTOMY     ??? TUBAL LIGATION         Social History     Socioeconomic History   ??? Marital status: Single     Spouse name: None   ??? Number of children: 2   ??? Years of education: None   ??? Highest education level: None   Tobacco Use   ??? Smoking status: Every Day     Packs/day: 0.30     Years: 14.00     Pack years: 4.20     Types: Cigarettes   ??? Smokeless tobacco: Never   ??? Tobacco comments:     Working on quiting - 1PPD   Vaping Use   ??? Vaping Use: Never used   Substance and Sexual Activity   ??? Alcohol use: No     Alcohol/week: 0.0 standard drinks   ??? Drug use: Yes     Types: Marijuana     Comment: 1-3x monthly    ??? Sexual activity: Yes     Partners: Male     Social Determinants of Health     Transportation Needs: No Transportation Needs   ??? Lack of Transportation (Medical): No   ??? Lack of Transportation (Non-Medical): No     She is currently not working.     Family History   Problem Relation Age of Onset   ??? Colorectal Cancer Father    ??? Arthritis Father    ??? Diabetes Father    ??? Drug abuse Father    ??? Heart disease Father    ??? Hypertension Father    ??? Stroke Father    ??? Clotting disorder Father    ??? Ulcers Father    ??? Colorectal Cancer Paternal Uncle    ??? Ovarian cancer Mother    ??? Arthritis Mother    ??? Asthma Mother    ??? Cancer Mother         Uteral   ??? COPD Mother    ??? Depression Mother    ??? Diabetes Mother    ??? Heart disease Mother    ??? Hyperlipidemia Mother    ??? Hypertension Mother    ??? Kidney disease Mother    ??? Mental illness Mother    ??? Stroke Mother    ??? Vision loss Mother         Due to massive heart attack   ??? Clotting disorder Mother    ??? Liver disease Mother    ??? Ulcers Mother    ??? Glaucoma Mother    ??? Breast cancer Paternal Grandmother    ??? Asthma Sister    ??? Depression Sister    ??? Diabetes Sister    ??? Heart disease Sister    ??? Hypertension Sister    ???  Stroke Sister    ??? Birth defects Sister         Prime   ??? Clotting disorder Sister    ??? Ulcers Sister    ??? Cancer Maternal Grandmother         Breast   ??? Early death Sister    ??? Early death Brother    ??? Learning disabilities Son    ??? Learning disabilities Son    ??? Aneurysm Paternal Grandfather                     Review of Systems:   10 organ systems reviewed and pertinent as noted in HPI.      OBJECTIVE:     Temp 36.6 ??C (97.8 ??F)  - Ht 165.1 cm (5' 5)  - Wt (!) 127.2 kg (280 lb 6.4 oz)  - LMP 03/01/2002  - BMI 46.66 kg/m??     Physical Exam:     Vital Signs: Reviewed in Epic. Hemodynamically stable.   General: Appears comfortable. No obvious distress.  ENT: Mucous membranes moist. Oropharynx clear.  Neck: Supple.  Cardiovascular: Extremities warm and nonedematous. Regular rate and rhythm. 2+ radial pulses bilaterally.  Pulmonary: Breathing is comfortable and unlabored.   Integument: No obvious rashes or ecchymoses.  Psychiatry: Mood and affect appear appropriate to situation.  Abdomen: Soft, nontender, nondistended.  Genitourinary: Deferred  Rectal: Deferred.  Extremities: Warm and well-perfused. No cyanosis, clubbing, or edema.  Musculoskeletal: Limited range of motion in lumbar spine, with extension  Neurologic:  Awake, alert  LUE: 5/5 deltoid, 5/5 bicep, 5/5 tricep, 5/5 wrist extension, 5/5 wrist flexion, 5/5 hand grip, 5/5 hand intrinsics  RUE: 5/5 deltoid, 5/5 bicep, 5/5 tricep, 5/5 wrist extension, 5/5 wrist flexion, 5/5 hand grip, 5/5 hand intrinsics  LLE: 5/5 iliopsoas, 5/5 quadriceps, 5/5 hamstrings, 5/5 dorsiflexion, 5/5 plantarflexion, 5/5 EHL  RLE: 5/5 iliopsoas, 5/5 quadriceps, 5/5 hamstrings, 5/5 dorsiflexion, 5/5 plantarflexion, 5/5 EHL  Reflexes 2+ and symmetric in biceps/triceps/brachioradialis/patellae/ankles  Sensation to light touch intact throughout.  gait wnl.     Diagnostic Studies:   She has had no recent imaging   Lumbar xrays (03/2020) personally reviewed which demonstrates grade 1 anterolisthesis of L4 on 5 with mild facet arthropathy    Tula Nakayama, ANP  06/24/2021 1:41 PM

## 2021-07-09 NOTE — Unmapped (Signed)
TEXT TO RESCHEDULE APPT 01/24 (NEXT AVAILABLE)

## 2021-08-02 NOTE — Unmapped (Signed)
The [facility] has made a second and final attempt to reach this patient to refill the following medication:Repatha.      We have left voicemails on the following phone numbers: [phone], [facility] text.    Dates contacted: 07/25/21, 08/01/21  Last scheduled delivery: 05/16/21    The patient may be at risk of non-compliance with this medication. The patient should call the [facility] at [phone]  Option 4, then Option 2 (all other specialty patients) to refill medication.    Alyssa Herrera   [facility]

## 2021-08-05 NOTE — Unmapped (Unsigned)
DIVISION OF CARDIOLOGY   [facility], [place]                                                                         Date of Service:  08/06/2021     Assessment/Plan   1. Palpitations  Patient with PVC noted during triggered events while wearing monitor.  We talked about preventative strategies mostly through lifestyle modifications to prevent palpitations.    2. Coronary artery calcification  PET perfusion study showed coronary artery calcifications even though there is no ischemic burden.  I recommended standard treatment for stable coronary artery disease.  She will remain on her aspirin and will attempt low-dose atorvastatin with addition of Repatha with LDL goal of less than 70.    3. Hyperlipidemia, unspecified hyperlipidemia type  As described above.  - evolocumab 140 mg/mL PnIj; Inject the contents of one pen (140 mg) under the skin every fourteen (14) days.  Dispense: 6 mL; Refill: 3    - evolocumab 140 mg/mL PnIj; Inject 140 mg under the skin every fourteen (14) days.  Dispense: 6 mL; Refill: 3      Return to clinic:  No follow-ups on file.    I personally spent 31 minutes face-to-face and non-face-to-face in the care of this patient, which includes all pre, intra, and post visit time on the date of service.     No follow-ups on file.    Subjective:   ZOX:WRUEAV Ballard Russell, FNP  Chief complaint:  46 y.o. female with a history of GERD, obesity, PTSD, depression and anxiety  is referred by Desmond Dike, NP for evaluation of chest pain and SOB.    History of Present Illness:  Patient saw her PCP on 08/13/20 endorsing episodes of chest wall pain and SOB. Echo was ordered which showed LVEF 55%, and normal RV systolic function. Patient was seen in clinic by me on 09/28/20 to establish care and reported episodes of chest pain that had been ongoing for the prior 6 months. Episodes started out with sensation of a hot flash and a gradual squeezing sensation in her chest with radiation into her neck, racing heart beats, and occasional shortness of breath, dizziness, and diaphoresis. She was tried on crestor in the past, but unable to tolerate. Started ASA 81mg . I also started patient on pravastatin 40mg  on 11/29/20. Stress test showed no ischemia or scar. Ziopatch showed SR with one short episode of SVT. Patient was last seen in clinic by me on 02/19/21 reporting doing well. Attempted low-dose atorvastatin with addition of Repatha to help lower her LDL.       ***  Patient has tried simvastatin and pravastatin to control her cholesterol but had significant myalgias and muscle cramps with taking this. She is not currently on a statin. Patient takes ASA daily. She is interested in trying evolocumab. Patient has required to take lovenox injections in the past. She is agreeable to trying lipitor and evolocumab. Patient has been cutting back on unhealthy foods. She has lost ~15 lbs.  Endorses tobacco use, 1/2 ppd. Started smoking at the age of 78, previously 4 ppd. Previously tried on chantix and nicotine patches. Mother had heart attack at age of 51, with history  of atrial fibrillation. Sister had a heart attack at 3, and history of strokes. Lives with 58 year old granddaughter. Has two children.     Past Medical History  Patient Active Problem List   Diagnosis   ??? Anxiety   ??? GERD (gastroesophageal reflux disease)   ??? Obesity   ??? Vitamin D deficiency   ??? Morbid (severe) obesity due to excess calories (CMS-HCC)   ??? Moderate persistent asthma without complication   ??? Asthma   ??? Arthralgia   ??? Coronary artery calcification   ??? Degeneration of intervertebral disc   ??? Hyperlipidemia   ??? Irritable bowel syndrome   ??? Low back pain   ??? Mitral valve disorder   ??? Nicotine dependence, uncomplicated   ??? Other seasonal allergic rhinitis   ??? Other chronic pain       Medications:  Current Outpatient Medications   Medication Sig Dispense Refill   ??? acetaminophen (TYLENOL ARTHRITIS ORAL) Take by mouth.     ??? albuterol 2.5 mg /3 mL (0.083 %) nebulizer solution Inhale 3 mL (2.5 mg total) by nebulization four (4) times a day. 100 mL 3   ??? albuterol HFA 90 mcg/actuation inhaler INHALE 2 PUFFS BY MOUTH EVERY 6 HOURS AS NEEDED FOR WHEEZE 18 g 5   ??? atorvastatin (LIPITOR) 10 MG tablet Take 1 tablet (10 mg total) by mouth daily. 30 tablet 6   ??? diclofenac (VOLTAREN) 50 MG EC tablet Take 1 tablet (50 mg total) by mouth Two (2) times a day. For arthritis pain. 60 tablet 1   ??? doxycycline (VIBRAMYCIN) 100 MG capsule Take 1 capsule (100 mg total) by mouth Two (2) times a day. (Patient not taking: Reported on 06/24/2021) 14 capsule 0   ??? empty container (SHARPS-A-GATOR DISPOSAL SYSTEM) Misc Use as directed for sharps disposal 1 each 2   ??? ergocalciferol-1,250 mcg, 50,000 unit, (DRISDOL) 1,250 mcg (50,000 unit) capsule Take 1 capsule (1,250 mcg total) by mouth once a week. 12 capsule 2   ??? escitalopram oxalate (LEXAPRO) 10 MG tablet TAKE 1 TABLET (10 MG TOTAL) BY MOUTH DAILY. FOR ANXIETY AND MOOD. 90 tablet 2   ??? evolocumab 140 mg/mL PnIj Inject the contents of one pen (140 mg) under the skin every fourteen (14) days. 6 mL 3   ??? fluticasone propionate (FLOVENT HFA) 110 mcg/actuation inhaler Inhale 2 puffs Two (2) times a day. As needed for cough 12 g 0   ??? furosemide (LASIX) 20 MG tablet Take 1 tablet (20 mg total) by mouth Two (2) times a day. (Patient taking differently: Take 20 mg by mouth daily. Takes once daily, and once extra prn) 180 tablet 1   ??? ipratropium-albuterol (DUO-NEB) 0.5-2.5 mg/3 mL nebulizer Inhale 3 mL every six (6) hours as needed.      ??? LINZESS 145 mcg capsule TAKE 1 CAPSULE BY MOUTH EVERY DAY 90 capsule 1   ??? methocarbamoL (ROBAXIN) 750 MG tablet Take 1 tablet (750 mg total) by mouth four (4) times a day. As needed for pain. 60 tablet 1   ??? multivitamin (TAB-A-VITE/THERAGRAN) per tablet Take 1 tablet by mouth daily.     ??? olopatadine (PATADAY) 0.2 % ophthalmic solution Administer 1 drop to both eyes Two (2) times a day. ??? omeprazole (PRILOSEC) 20 MG capsule Take 20 mg by mouth daily.     ??? propylene glycol (SYSTANE BALANCE OPHT) Apply 1 drop to eye nightly. Both eyes       No current facility-administered medications for  this visit.       Allergies  Allergies   Allergen Reactions   ??? Penicillins Hives and Shortness Of Breath     Can take amoxicillian   ??? Tramadol Hives and Shortness Of Breath     Breathing difficulty   Blurred vision  Chest pain   ??? Gabapentin Other (See Comments)     Seizure     ??? Pravastatin Muscle Pain   ??? Simvastatin Muscle Pain       Social History:   Social History     Tobacco Use   ??? Smoking status: Every Day     Packs/day: 0.30     Years: 14.00     Pack years: 4.20     Types: Cigarettes   ??? Smokeless tobacco: Never   ??? Tobacco comments:     Working on [employer] - 1PPD   Vaping Use   ??? Vaping Use: Never used   Substance Use Topics   ??? Alcohol use: No     Alcohol/week: 0.0 standard drinks   ??? Drug use: Yes     Types: Marijuana     Comment: 1-3x monthly        Family History:  Family History   Problem Relation Age of Onset   ??? Colorectal Cancer Father    ??? Arthritis Father    ??? Diabetes Father    ??? Drug abuse Father    ??? Heart disease Father    ??? Hypertension Father    ??? Stroke Father    ??? Clotting disorder Father    ??? Ulcers Father    ??? Colorectal Cancer Paternal Uncle    ??? Ovarian cancer Mother    ??? Arthritis Mother    ??? Asthma Mother    ??? Cancer Mother         Uteral   ??? COPD Mother    ??? Depression Mother    ??? Diabetes Mother    ??? Heart disease Mother    ??? Hyperlipidemia Mother    ??? Hypertension Mother    ??? Kidney disease Mother    ??? Mental illness Mother    ??? Stroke Mother    ??? Vision loss Mother         Due to massive heart attack   ??? Clotting disorder Mother    ??? Liver disease Mother    ??? Ulcers Mother    ??? Glaucoma Mother    ??? Breast cancer Paternal Grandmother    ??? Asthma Sister    ??? Depression Sister    ??? Diabetes Sister    ??? Heart disease Sister    ??? Hypertension Sister    ??? Stroke Sister    ??? Birth defects Sister         Prime   ??? Clotting disorder Sister    ??? Ulcers Sister    ??? Cancer Maternal Grandmother         Breast   ??? Early death Sister    ??? Early death Brother    ??? Learning disabilities Son    ??? Learning disabilities Son    ??? Aneurysm Paternal Grandfather        ROS- 12 system review is negative other than what is specified in the History of Present Illness.      Objective:   Physical Exam  There were no vitals filed for this visit.   General-  Obese appearing female in no apparent distress.  Neurologic- Alert and oriented X3.  Cranial nerve II-XII grossly intact.  HEENT-  Normocephalic atraumatic head.  No scleral icterus.  Wearing face mask.  Psych- Normal mood, appropriate.    Exam was deferred due to counseling.     Laboratory data:    I have personally reviewed the images of the following diagnostic studies.      Electrocardiogram:  From 09/28/20 showed SR, borderline low voltage.    Echocardiogram:  From 06/2015 at [facility] showed LV systolic function and size were normal. Estimated EF 60-65%. Wall motion was normal; there were no regional wall motion abnormalities. Left ventricular diastolic function parameters were normal. LA normal in size. RV systolic function was normal. Pulmonary artery systolic pressure was within the normal range.     From 09/21/20 showed LV upper normal in size with normal wall thickness, LVEF 55%, normal RV systolic function, no significant valvular abnormalities.    Nuclear stress test:  PET CT stress test showed 10/11/20 showed normal myocardial perfusion study. No evidence of any significant ischemia or scar. Left ventricular systolic function is normal. Post stress the ejection fraction is > 60%. Minimal coronary calcifications are noted. Status post cholecystectomy.    Cardiac monitor:  From 10/16/20 showed patient had a min HR of 48 bpm, max HR of 169 bpm, and avg HR of 80 bpm. Predominant underlying rhythm was Sinus Rhythm. 1 run of Supraventricular Tachycardia occurred lasting 4 beats with a max rate of 169 bpm (avg 150 bpm). Isolated SVEs were rare (<1.0%), SVE Couplets were rare (<1.0%), and SVE Triplets were rare (<1.0%). Isolated VEs were rare (<1.0%), VE Couplets were rare (<1.0%), and no VE Triplets were Present. There were 55 patient triggered events. ??49 of the episodes corresponded to normal sinus rhythm. ??6 episodes corresponded to isolated PVCs.    Lipid panel:  Component      Latest Ref Rng & Units 09/28/2020   Triglycerides      0 - 150 mg/dL 161 (H)   Cholesterol      <=200 mg/dL 096 (H)   HDL      40 - 60 mg/dL 61 (H)   LDL calculated      40 - 99 mg/dL 045 (H)   VLDL Cholesterol Cal      9 - 37 mg/dL 40.9   Chol/HDL Ratio      1.0 - 4.5 4.2   Non-HDL Cholesterol      70 - 811 mg/dL 914 (H)   FASTING       Unknown     Documentation assistance was provided by Halina Maidens, Scribe on August 05, 2021 at 1:11 PM for Joneen Roach, MD.     I have reviewed the documentation provided by the scribe and confirm that it accurately reflects the service I personally performed and the decisions made by me.  Signature: CSD  Date: 08/06/2021  Time: 1:11 PM

## 2021-08-06 ENCOUNTER — Emergency Department
Admit: 2021-08-06 | Discharge: 2021-08-06 | Disposition: A | Discharge status: Home / Self Care | Payer: MEDICARE | Attending: Emergency Medicine

## 2021-08-06 ENCOUNTER — Ambulatory Visit
Admit: 2021-08-06 | Discharge: 2021-08-06 | Disposition: A | Discharge status: Home / Self Care | Payer: MEDICARE | Attending: Emergency Medicine

## 2021-08-06 DIAGNOSIS — R29810 Facial weakness: Principal | ICD-10-CM

## 2021-08-06 DIAGNOSIS — R42 Dizziness and giddiness: Principal | ICD-10-CM

## 2021-08-06 LAB — CBC W/ AUTO DIFF
BASOPHILS ABSOLUTE COUNT: 0 10*9/L (ref 0.0–0.1)
BASOPHILS RELATIVE PERCENT: 0.3 %
EOSINOPHILS ABSOLUTE COUNT: 0.1 10*9/L (ref 0.0–0.5)
EOSINOPHILS RELATIVE PERCENT: 1 %
HEMATOCRIT: 44.8 % — ABNORMAL HIGH (ref 34.0–44.0)
HEMOGLOBIN: 15 g/dL — ABNORMAL HIGH (ref 11.3–14.9)
LYMPHOCYTES ABSOLUTE COUNT: 2.5 10*9/L (ref 1.1–3.6)
LYMPHOCYTES RELATIVE PERCENT: 30.2 %
MEAN CORPUSCULAR HEMOGLOBIN CONC: 33.4 g/dL (ref 32.0–36.0)
MEAN CORPUSCULAR HEMOGLOBIN: 31 pg (ref 25.9–32.4)
MEAN CORPUSCULAR VOLUME: 92.9 fL (ref 77.6–95.7)
MEAN PLATELET VOLUME: 8 fL (ref 6.8–10.7)
MONOCYTES ABSOLUTE COUNT: 0.6 10*9/L (ref 0.3–0.8)
MONOCYTES RELATIVE PERCENT: 7.3 %
NEUTROPHILS ABSOLUTE COUNT: 5.1 10*9/L (ref 1.8–7.8)
NEUTROPHILS RELATIVE PERCENT: 61.2 %
PLATELET COUNT: 321 10*9/L (ref 150–450)
RED BLOOD CELL COUNT: 4.82 10*12/L (ref 3.95–5.13)
RED CELL DISTRIBUTION WIDTH: 12.6 % (ref 12.2–15.2)
WBC ADJUSTED: 8.4 10*9/L (ref 3.6–11.2)

## 2021-08-06 LAB — COMPREHENSIVE METABOLIC PANEL
ALBUMIN: 3.9 g/dL (ref 3.4–5.0)
ALKALINE PHOSPHATASE: 62 U/L (ref 46–116)
ALT (SGPT): 14 U/L (ref 10–49)
ANION GAP: 8 mmol/L (ref 5–14)
AST (SGOT): 19 U/L (ref <=34)
BILIRUBIN TOTAL: 0.6 mg/dL (ref 0.3–1.2)
BLOOD UREA NITROGEN: 10 mg/dL (ref 9–23)
BUN / CREAT RATIO: 14
CALCIUM: 9.3 mg/dL (ref 8.7–10.4)
CHLORIDE: 107 mmol/L (ref 98–107)
CO2: 27 mmol/L (ref 20.0–31.0)
CREATININE: 0.7 mg/dL
EGFR CKD-EPI (2021) FEMALE: >90 mL/min/{1.73_m2} (ref >=60)
GLUCOSE RANDOM: 97 mg/dL (ref 70–179)
POTASSIUM: 4.4 mmol/L (ref 3.4–4.8)
PROTEIN TOTAL: 7.4 g/dL (ref 5.7–8.2)
SODIUM: 142 mmol/L (ref 135–145)

## 2021-08-06 LAB — MAGNESIUM: MAGNESIUM: 2.1 mg/dL (ref 1.6–2.6)

## 2021-08-06 LAB — URINALYSIS WITH MICROSCOPY WITH CULTURE REFLEX
BACTERIA: NONE SEEN /HPF
BILIRUBIN UA: NEGATIVE
BLOOD UA: NEGATIVE
GLUCOSE UA: NEGATIVE
KETONES UA: NEGATIVE
LEUKOCYTE ESTERASE UA: NEGATIVE
NITRITE UA: NEGATIVE
PH UA: 6 (ref 5.0–9.0)
PROTEIN UA: NEGATIVE
RBC UA: 1 /HPF (ref <=4)
SPECIFIC GRAVITY UA: 1.01 (ref 1.003–1.030)
SQUAMOUS EPITHELIAL: <1 /HPF (ref 0–5)
UROBILINOGEN UA: <2
WBC UA: <1 /HPF (ref 0–5)

## 2021-08-06 LAB — HEMOGLOBIN A1C
ESTIMATED AVERAGE GLUCOSE: 111 mg/dL
HEMOGLOBIN A1C: 5.5 % (ref 4.8–5.6)

## 2021-08-06 LAB — LIPID PANEL
CHOLESTEROL/HDL RATIO SCREEN: 2.4 (ref 1.0–4.5)
CHOLESTEROL: 141 mg/dL (ref <=200)
HDL CHOLESTEROL: 58 mg/dL (ref 40–60)
LDL CHOLESTEROL CALCULATED: 58 mg/dL (ref 40–99)
NON-HDL CHOLESTEROL: 83 mg/dL (ref 70–130)
TRIGLYCERIDES: 126 mg/dL (ref 0–150)
VLDL CHOLESTEROL CAL: 25.2 mg/dL (ref 9–37)

## 2021-08-06 LAB — HIGH SENSITIVITY TROPONIN I - SINGLE: HIGH SENSITIVITY TROPONIN I: <3 ng/L (ref <=34)

## 2021-08-06 MED ADMIN — LORazepam (ATIVAN) injection 0.5 mg: .5 mg | INTRAVENOUS | @ 15:00:00 | Stop: 2021-08-06

## 2021-08-06 MED ADMIN — gadobenate dimeglumine (MULTIHANCE) 529 mg/mL (0.1mmol/0.2mL) solution 11 mL: 11 mL | INTRAVENOUS | @ 18:00:00 | Stop: 2021-08-06

## 2021-08-06 MED ADMIN — lactated ringers bolus 1,000 mL: 1000 mL | INTRAVENOUS | @ 15:00:00 | Stop: 2021-08-06

## 2021-08-06 NOTE — Unmapped (Signed)
[facility]  Emergency Department Provider Note     ED Clinical Impression     Final diagnoses:   Dizziness (Primary)   Facial droop      Impression, Medical Decision Making, ED Course     Impression: 46 y.o. female with complex past medical history most significant for obesity, anxiety, depression, irritable bowel syndrome, nicotine dependence, hyperlipidemia, chronic lower back pain, previous TIA in 2008, who presents with concerns for multiple days of dizziness, generalized malaise, right-sided face weakness.  Patient reports that starting roughly 1 week ago, she began experiencing sinus pressure and generalized malaise.  At one point last week, she was told by her boss that she had developed a right-sided facial droop, which is not something that she has had previously, and she did not want to go to a physician at that time.  Since 1 week ago, she has had progressively worsening dizziness that is constant and worse with movement.  Reports presyncopal features (did not actually experience syncope) earlier today while at work, which prompted her to come to the emergency department. She also reports blurry vision, though no visual field deficits.  She denies any chest pain or shortness of breath.  No numbness or tingling.    Upon initial evaluation in the emergency department, patient with stable vitals.  Benign cardiopulmonary exam.  Normal speech and language.  Neurologic exam slightly limited due to patient's participation. Extraocular movements intact without nystagmus, pupils are 2mm and equally reactive bilaterally, cranial nerve exam notable for right-sided lower facial droop, strength 5 out of 5 in bilateral upper and lower extremities. Normal finger-nose testing, no dysdiadochokinesia.    DDx/MDM: Given patient's description of persistent dizziness, as well as right-sided facial droop (NIHSS 1), will plan to obtain MRI brain/MRA neck to evaluate for stroke.  Also considered infection, electrolyte/metabolic abnormalities, peripheral vertigo as cause of patient's dizziness. Basic labs to evaluate for electrolyte/metabolic abnormalities, A1c and lipid panel to evaluate for risk stratification.  We will plan to treat patient with IV fluids, small dose of IV Ativan for dizziness and anxiety.  Disposition pending results of work-up.    Orders Placed This Encounter   Procedures   ??? Rapid Influenza/RSV/COVID PCR   ??? MRI Brain W Wo Contrast   ??? MRA Neck W Wo Contrast   ??? Urinalysis with Microscopy with Culture Reflex   ??? Comprehensive Metabolic Panel   ??? Hemoglobin A1c   ??? Lipid panel   ??? hsTroponin I (single, no delta)   ??? Magnesium Level   ??? CBC w/ Differential   ??? ECG 12 Lead       ED Course  ED Course as of 08/06/21 1602   Tue Aug 06, 2021   1016 Laboratory work-up unremarkable.   1018 ECG 12 Lead  EKG, independent interpretation:  Rate: 60 bpm  Rhythm: normal sinus rhythm  Narrative Interpretation: Rhythm as above, normal intervals, no ST segment elevations or depressions, normal morphology, new T wave inversion in V2 compared to EKG in April 2022, otherwise unchanged from prior.     1447 MRI Brain W Wo Contrast  MRI brain, which have independently interpreted, unremarkable without any evidence of acute intracranial pathology.   1449 Overall wholly reassuring work-up here in the emergency department.  I discussed reassuring work-up with patient and son at bedside.  They expressed understanding plan for discharge and return precautions.  Patient is okay for discharge.       Discussion of Management with other Physicians, QHP,  or Appropriate Source: None  Independent Interpretation of Studies: If applicable, documented in ED Course above.  External Records Reviewed: I have reviewed recent and relevant previous record, including: Outpatient notes - Spine clinic note from 06/24/2021 and Outpatient labs & studies - CBC, CMP, TSH, other labs from 03/2021  Escalation of Care, Consideration of Admission/Observation/Transfer: Admission not required. Appropriate for outpatient management.      ____________________________________________    The case was discussed with the attending physician, who is in agreement with the above assessment and plan.      History     Chief Complaint  Chief Complaint   Patient presents with   ??? Weakness       HPI   Alyssa Herrera is a 46 y.o. female with complex past medical history most significant for obesity, anxiety, depression, irritable bowel syndrome, nicotine dependence, hyperlipidemia, chronic lower back pain, previous TIA in 2008, who presents with concerns for multiple days of dizziness, generalized malaise, right-sided face weakness.  Patient reports that starting roughly 1 week ago, she began experiencing sinus pressure and generalized malaise.  At one point last week, she was told by her boss that she had developed a right-sided facial droop, which is not something that she has had previously, and she did not want to go to a physician at that time.  Since 1 week ago, she has had progressively worsening dizziness that is constant and worse with movement.  Reports presyncopal features (did not actually experience syncope) earlier today while at work, which prompted her to come to the emergency department. She also reports blurry vision, though no visual field deficits.  She denies any chest pain or shortness of breath.  No numbness or tingling.    Outside Historian(s): I have obtained additional independent history/collateral from a family member and EMS.    Past Medical History:   Diagnosis Date   ??? ADD (attention deficit disorder)    ??? Aneurysm (CMS-HCC)     cerebral   ??? Anxiety    ??? Asthma    ??? At risk for falls     off balance, arthritis (hips, knees, feet) - last fall 02/2020   ??? Borderline diabetes    ??? Caregiver burden     39 yo granddaughter    ??? Cerebrovascular disease    ??? Constipation    ??? COPD (chronic obstructive pulmonary disease) (CMS-HCC) 08/05/2010   ??? DDD (degenerative disc disease), cervical    ??? Depression    ??? Financial difficulties     house repairs, dental work   ??? GERD (gastroesophageal reflux disease)    ??? Hiatal hernia    ??? Hyperlipidemia    ??? Hypertension    ??? IBS (irritable bowel syndrome)    ??? Impaired mobility     off balance, arthritis (hips, knees, feet)   ??? Irregular heartbeat    ??? Moderate asthma    ??? MVP (mitral valve prolapse)    ??? Obesity    ??? OCD (obsessive compulsive disorder)    ??? Pancreatitis    ??? Peptic ulceration    ??? Seizures (CMS-HCC)     last episode last week, focal with right facial droop/right arm weakness   ??? Stroke (CMS-HCC) 2016    right arm weakness when she gets tired   ??? Visual impairment     contacts   ??? Vitamin D deficiency        Past Surgical History:   Procedure Laterality Date   ???  CARDIAC CATHETERIZATION  2009    2015   ??? CESAREAN SECTION  06/22/1996/09/07/98    Birth of 3 sons   ??? CHOLECYSTECTOMY     ??? COLONOSCOPY     ??? HYSTERECTOMY     ??? OOPHORECTOMY     ??? PR COLONOSCOPY W/BIOPSY SINGLE/MULTIPLE N/A 11/13/2016    Procedure: COLONOSCOPY, FLEXIBLE, PROXIMAL TO SPLENIC FLEXURE; WITH BIOPSY, SINGLE OR MULTIPLE;  Surgeon: Toney Reil, MD;  [facility];  Service: Gastroenterology   ??? PR COLONOSCOPY W/BIOPSY SINGLE/MULTIPLE N/A 12/03/2018    Procedure: ESOPHAGOGASTRODUODENOSCOPY WITH BIOPSY;  Surgeon: Laretta Bolster, MD;  Location: [facility]O [facility] [place];  Service: Gastroenterology   ??? TONSILLECTOMY     ??? TUBAL LIGATION           Current Facility-Administered Medications:   ???  lactated ringers bolus 1,000 mL, 1,000 mL, Intravenous, Once, Jesse Fall, MD, 1,000 mL at 08/06/21 0932  ???  LORazepam (ATIVAN) injection 0.5 mg, 0.5 mg, Intravenous, Once, Jesse Fall, MD    Current Outpatient Medications:   ???  acetaminophen (TYLENOL ARTHRITIS ORAL), Take by mouth., Disp: , Rfl:   ???  albuterol 2.5 mg /3 mL (0.083 %) nebulizer solution, Inhale 3 mL (2.5 mg total) by nebulization four (4) times a day., Disp: 100 mL, Rfl: 3  ???  albuterol HFA 90 mcg/actuation inhaler, INHALE 2 PUFFS BY MOUTH EVERY 6 HOURS AS NEEDED FOR WHEEZE, Disp: 18 g, Rfl: 5  ???  atorvastatin (LIPITOR) 10 MG tablet, Take 1 tablet (10 mg total) by mouth daily., Disp: 30 tablet, Rfl: 6  ???  diclofenac (VOLTAREN) 50 MG EC tablet, Take 1 tablet (50 mg total) by mouth Two (2) times a day. For arthritis pain., Disp: 60 tablet, Rfl: 1  ???  doxycycline (VIBRAMYCIN) 100 MG capsule, Take 1 capsule (100 mg total) by mouth Two (2) times a day. (Patient not taking: Reported on 06/24/2021), Disp: 14 capsule, Rfl: 0  ???  empty container (SHARPS-A-GATOR DISPOSAL SYSTEM) Misc, Use as directed for sharps disposal, Disp: 1 each, Rfl: 2  ???  ergocalciferol-1,250 mcg, 50,000 unit, (DRISDOL) 1,250 mcg (50,000 unit) capsule, Take 1 capsule (1,250 mcg total) by mouth once a week., Disp: 12 capsule, Rfl: 2  ???  escitalopram oxalate (LEXAPRO) 10 MG tablet, TAKE 1 TABLET (10 MG TOTAL) BY MOUTH DAILY. FOR ANXIETY AND MOOD., Disp: 90 tablet, Rfl: 2  ???  evolocumab 140 mg/mL PnIj, Inject the contents of one pen (140 mg) under the skin every fourteen (14) days., Disp: 6 mL, Rfl: 3  ???  fluticasone propionate (FLOVENT HFA) 110 mcg/actuation inhaler, Inhale 2 puffs Two (2) times a day. As needed for cough, Disp: 12 g, Rfl: 0  ???  furosemide (LASIX) 20 MG tablet, Take 1 tablet (20 mg total) by mouth Two (2) times a day. (Patient taking differently: Take 20 mg by mouth daily. Takes once daily, and once extra prn), Disp: 180 tablet, Rfl: 1  ???  ipratropium-albuterol (DUO-NEB) 0.5-2.5 mg/3 mL nebulizer, Inhale 3 mL every six (6) hours as needed. , Disp: , Rfl:   ???  LINZESS 145 mcg capsule, TAKE 1 CAPSULE BY MOUTH EVERY DAY, Disp: 90 capsule, Rfl: 1  ???  methocarbamoL (ROBAXIN) 750 MG tablet, Take 1 tablet (750 mg total) by mouth four (4) times a day. As needed for pain., Disp: 60 tablet, Rfl: 1  ???  multivitamin (TAB-A-VITE/THERAGRAN) per tablet, Take 1 tablet by mouth daily., Disp: , Rfl:   ???  olopatadine (PATADAY) 0.2 % ophthalmic solution, Administer 1 drop to both eyes Two (2) times a day., Disp: , Rfl:   ???  omeprazole (PRILOSEC) 20 MG capsule, Take 20 mg by mouth daily., Disp: , Rfl:   ???  propylene glycol (SYSTANE BALANCE OPHT), Apply 1 drop to eye nightly. Both eyes, Disp: , Rfl:     Allergies  Penicillins, Tramadol, Gabapentin, Pravastatin, and Simvastatin    Family History  Family History   Problem Relation Age of Onset   ??? Colorectal Cancer Father    ??? Arthritis Father    ??? Diabetes Father    ??? Drug abuse Father    ??? Heart disease Father    ??? Hypertension Father    ??? Stroke Father    ??? Clotting disorder Father    ??? Ulcers Father    ??? Colorectal Cancer Paternal Uncle    ??? Ovarian cancer Mother    ??? Arthritis Mother    ??? Asthma Mother    ??? Cancer Mother         Uteral   ??? COPD Mother    ??? Depression Mother    ??? Diabetes Mother    ??? Heart disease Mother    ??? Hyperlipidemia Mother    ??? Hypertension Mother    ??? Kidney disease Mother    ??? Mental illness Mother    ??? Stroke Mother    ??? Vision loss Mother         Due to massive heart attack   ??? Clotting disorder Mother    ??? Liver disease Mother    ??? Ulcers Mother    ??? Glaucoma Mother    ??? Breast cancer Paternal Grandmother    ??? Asthma Sister    ??? Depression Sister    ??? Diabetes Sister    ??? Heart disease Sister    ??? Hypertension Sister    ??? Stroke Sister    ??? Birth defects Sister         Prime   ??? Clotting disorder Sister    ??? Ulcers Sister    ??? Cancer Maternal Grandmother         Breast   ??? Early death Sister    ??? Early death Brother    ??? Learning disabilities Son    ??? Learning disabilities Son    ??? Aneurysm Paternal Grandfather        Social History  Social History     Tobacco Use   ??? Smoking status: Every Day     Packs/day: 0.30     Years: 14.00     Pack years: 4.20     Types: Cigarettes   ??? Smokeless tobacco: Never   ??? Tobacco comments:     Working on [employer] - 1PPD   Vaping Use   ??? Vaping Use: Never used   Substance Use Topics ??? Alcohol use: No     Alcohol/week: 0.0 standard drinks   ??? Drug use: Yes     Types: Marijuana     Comment: 1-3x monthly         Physical Exam     VITAL SIGNS:      Vitals:    08/06/21 0901   BP: 147/81   Pulse: 62   Resp: 10   Temp: 36.4 ??C (97.6 ??F)   TempSrc: Oral   SpO2: 98%       Constitutional: Alert and oriented. No acute distress.  Eyes: Conjunctivae are normal.  HEENT: Normocephalic  and atraumatic. Conjunctivae clear. No congestion. Moist mucous membranes.   Cardiovascular: Rate as above, regular rhythm. Normal and symmetric distal pulses. Brisk capillary refill. Normal skin turgor.  Respiratory: Normal respiratory effort. Breath sounds are normal. There are no wheezing or crackles heard.  Gastrointestinal: Soft, non-distended, non-tender.  Genitourinary: Deferred.  Musculoskeletal: Non-tender with normal range of motion in all extremities.  Neurologic: Extraocular movements intact without nystagmus, pupils are 2mm and equally reactive bilaterally, cranial nerve exam notable for right-sided lower facial droop, strength 5 out of 5 in bilateral upper and lower extremities. Normal finger-nose testing, no dysdiadochokinesia.  Skin: Skin is warm, dry and intact. No rash noted.  Psychiatric: Mood and affect are normal. Speech and behavior are normal.     Radiology     MRI Brain W Wo Contrast    (Results Pending)   MRA Neck W Wo Contrast    (Results Pending)       Pertinent labs & imaging results that were available during my care of the patient were independently interpreted by me and considered in my medical decision making (see chart for details).    Portions of this record have been created using Scientist, clinical (histocompatibility and immunogenetics). Dictation errors have been sought, but may not have been identified and corrected.       Jesse Fall, MD  Resident  08/06/21 [phone]

## 2021-08-06 NOTE — Unmapped (Signed)
BIB EMS from home c/o weakness and dizziness that began days ago. Per EMS, intermittent left facial droop. Hx of TIA. No thinners. Given ASA PTA. No code stroke per attending

## 2021-08-06 NOTE — Unmapped (Signed)
Pt BIB EMS for blurry vision and weakness for the last few days.  Pt went to work this am, felt n/v and confused when entering work.  Pt also with chest pain.

## 2021-08-06 NOTE — Unmapped (Signed)
Bed: 72-D  Expected date:   Expected time:   Means of arrival:   Comments:  EMS

## 2021-08-08 ENCOUNTER — Ambulatory Visit: Admit: 2021-08-08 | Discharge: 2021-08-09 | Payer: MEDICARE | Attending: Adult Health | Primary: Adult Health

## 2021-08-08 DIAGNOSIS — I2584 Coronary atherosclerosis due to calcified coronary lesion: Principal | ICD-10-CM

## 2021-08-08 DIAGNOSIS — I2510 Atherosclerotic heart disease of native coronary artery without angina pectoris: Principal | ICD-10-CM

## 2021-08-08 DIAGNOSIS — F17210 Nicotine dependence, cigarettes, uncomplicated: Principal | ICD-10-CM

## 2021-08-08 DIAGNOSIS — E785 Hyperlipidemia, unspecified: Principal | ICD-10-CM

## 2021-08-08 DIAGNOSIS — G459 Transient cerebral ischemic attack, unspecified: Principal | ICD-10-CM

## 2021-08-08 MED ORDER — ATORVASTATIN 10 MG TABLET
ORAL_TABLET | Freq: Every day | ORAL | 3 refills | 90.00000 days | Status: CP
Start: 2021-08-08 — End: ?

## 2021-08-08 NOTE — Unmapped (Signed)
DIVISION OF CARDIOLOGY   [facility], [place]                                                                         Date of Service:  08/08/2021     Assessment/Plan     1. Coronary artery calcification  PET perfusion study showed coronary artery calcifications even though there is no ischemic burden. Strong FHx of CVD. No anginal symptoms   - continue ASA, statin, repatha  - continue regular exercise, wt loss    2. Hyperlipidemia, unspecified hyperlipidemia type  Prior intolerance to higher intensity statins.   On atorva 10 + repatha w/ excellent lipid control (LDL 162>>58). Tolerating well.     Lab Results   Component Value Date    LDL 58 08/06/2021     3. TIA  Recent ED visit for likely TIA.   Continue ASA, statin  Smoking cessation    4. Tobacco abuse  Actively working on cessation. Has NRT.     Return to clinic:  Return in about 6 months (around 02/05/2022).    I personally spent 31 minutes face-to-face and non-face-to-face in the care of this patient, which includes all pre, intra, and post visit time on the date of service.     Return in about 6 months (around 02/05/2022).    Subjective:   ZOX:[id number] Ballard Russell, FNP  Chief complaint:  46 y.o. female with a history of coronary calcium, HLD, GERD, obesity, PTSD, depression and anxiety  is referred by Desmond Dike, NP for f/up    History of Present Illness:  Patient saw her PCP on 08/13/20 endorsing episodes of chest wall pain and SOB. Echo was ordered which showed LVEF 55%, and normal RV systolic function. Patient was seen in clinic by Dr. Julio Alm on 09/28/20 to establish care and reported episodes of chest pain that had been ongoing for the prior 6 months. Episodes started out with sensation of a hot flash and a gradual squeezing sensation in her chest with radiation into her neck, racing heart beats, and occasional shortness of breath, dizziness, and diaphoresis. She was tried on crestor in the past, but unable to tolerate. Started ASA 81mg . Also started patient on pravastatin 40mg  on 11/29/20. Stress test showed no ischemia or scar. Ziopatch showed SR with one short episode of SVT. Patient was last seen in clinic by Dr. Julio Alm on 02/19/21 reporting doing well. Attempted low-dose atorvastatin with addition of Repatha to help lower her LDL.     Here today for f/up. Denies chest pain, Sob, swelling. Was seen in ED this week for likely TIA - R sided weakness & facial droop. Symptoms resolved.  She is working on weight loss.   Home BP 120/60s  LDL 162>>58  Smoking 1 pack every 3 days. Has NRT - planning to quit.   She's been under a lot of stress, having more anxiety. Has appt w/ therapist & seeing PCP.   Occasional skipped beats, not significant.   Exercise - walks, goes to gym - doing stair machine, plays bball.     Mother had heart attack at age of 46, with history of atrial fibrillation. Sister had a heart attack at 82, and history of  strokes. Lives with 56 year old granddaughter. Has two children.     Past Medical History  Patient Active Problem List   Diagnosis   ??? Anxiety   ??? GERD (gastroesophageal reflux disease)   ??? Obesity   ??? Vitamin D deficiency   ??? Morbid (severe) obesity due to excess calories (CMS-HCC)   ??? Moderate persistent asthma without complication   ??? Asthma   ??? Arthralgia   ??? Coronary artery calcification   ??? Degeneration of intervertebral disc   ??? Hyperlipidemia   ??? Irritable bowel syndrome   ??? Low back pain   ??? Mitral valve disorder   ??? Nicotine dependence, uncomplicated   ??? Other seasonal allergic rhinitis   ??? Other chronic pain       Medications:  Current Outpatient Medications   Medication Sig Dispense Refill   ??? acetaminophen (TYLENOL ARTHRITIS ORAL) Take by mouth.     ??? albuterol 2.5 mg /3 mL (0.083 %) nebulizer solution Inhale 3 mL (2.5 mg total) by nebulization four (4) times a day. 100 mL 3   ??? albuterol HFA 90 mcg/actuation inhaler INHALE 2 PUFFS BY MOUTH EVERY 6 HOURS AS NEEDED FOR WHEEZE 18 g 5   ??? diclofenac (VOLTAREN) 50 MG EC tablet Take 1 tablet (50 mg total) by mouth Two (2) times a day. For arthritis pain. 60 tablet 1   ??? ergocalciferol-1,250 mcg, 50,000 unit, (DRISDOL) 1,250 mcg (50,000 unit) capsule Take 1 capsule (1,250 mcg total) by mouth once a week. 12 capsule 2   ??? escitalopram oxalate (LEXAPRO) 10 MG tablet TAKE 1 TABLET (10 MG TOTAL) BY MOUTH DAILY. FOR ANXIETY AND MOOD. 90 tablet 2   ??? evolocumab 140 mg/mL PnIj Inject the contents of one pen (140 mg) under the skin every fourteen (14) days. 6 mL 3   ??? fluticasone propionate (FLOVENT HFA) 110 mcg/actuation inhaler Inhale 2 puffs Two (2) times a day. As needed for cough 12 g 0   ??? LINZESS 145 mcg capsule TAKE 1 CAPSULE BY MOUTH EVERY DAY 90 capsule 1   ??? methocarbamoL (ROBAXIN) 750 MG tablet Take 1 tablet (750 mg total) by mouth four (4) times a day. As needed for pain. 60 tablet 1   ??? multivitamin (TAB-A-VITE/THERAGRAN) per tablet Take 1 tablet by mouth daily.     ??? omeprazole (PRILOSEC) 20 MG capsule Take 20 mg by mouth daily.     ??? aspirin (ECOTRIN) 81 MG tablet Take 81 mg by mouth daily.     ??? atorvastatin (LIPITOR) 10 MG tablet Take 1 tablet (10 mg total) by mouth daily. 90 tablet 3   ??? doxycycline (VIBRAMYCIN) 100 MG capsule Take 1 capsule (100 mg total) by mouth Two (2) times a day. (Patient not taking: Reported on 06/24/2021) 14 capsule 0   ??? empty container (SHARPS-A-GATOR DISPOSAL SYSTEM) Misc Use as directed for sharps disposal 1 each 2   ??? furosemide (LASIX) 20 MG tablet Take 1 tablet (20 mg total) by mouth Two (2) times a day. (Patient taking differently: Take 20 mg by mouth daily. Takes once daily, and once extra prn) 180 tablet 1   ??? ipratropium-albuterol (DUO-NEB) 0.5-2.5 mg/3 mL nebulizer Inhale 3 mL every six (6) hours as needed.      ??? olopatadine (PATADAY) 0.2 % ophthalmic solution Administer 1 drop to both eyes Two (2) times a day. (Patient not taking: Reported on 08/08/2021)     ??? propylene glycol (SYSTANE BALANCE OPHT) Apply 1 drop to eye nightly.  Both eyes (Patient not taking: Reported on 08/08/2021)       No current facility-administered medications for this visit.       Allergies  Allergies   Allergen Reactions   ??? Penicillins Hives and Shortness Of Breath     Can take amoxicillian   ??? Tramadol Hives and Shortness Of Breath     Breathing difficulty   Blurred vision  Chest pain   ??? Gabapentin Other (See Comments)     Seizure     ??? Pravastatin Muscle Pain   ??? Simvastatin Muscle Pain       Social History:   Social History     Tobacco Use   ??? Smoking status: Every Day     Packs/day: 0.30     Years: 14.00     Pack years: 4.20     Types: Cigarettes   ??? Smokeless tobacco: Never   ??? Tobacco comments:     Working on [employer] - 1PPD   Vaping Use   ??? Vaping Use: Never used   Substance Use Topics   ??? Alcohol use: No     Alcohol/week: 0.0 standard drinks   ??? Drug use: Yes     Types: Marijuana     Comment: 1-3x monthly        Family History:  Family History   Problem Relation Age of Onset   ??? Colorectal Cancer Father    ??? Arthritis Father    ??? Diabetes Father    ??? Drug abuse Father    ??? Heart disease Father    ??? Hypertension Father    ??? Stroke Father    ??? Clotting disorder Father    ??? Ulcers Father    ??? Colorectal Cancer Paternal Uncle    ??? Ovarian cancer Mother    ??? Arthritis Mother    ??? Asthma Mother    ??? Cancer Mother         Uteral   ??? COPD Mother    ??? Depression Mother    ??? Diabetes Mother    ??? Heart disease Mother    ??? Hyperlipidemia Mother    ??? Hypertension Mother    ??? Kidney disease Mother    ??? Mental illness Mother    ??? Stroke Mother    ??? Vision loss Mother         Due to massive heart attack   ??? Clotting disorder Mother    ??? Liver disease Mother    ??? Ulcers Mother    ??? Glaucoma Mother    ??? Breast cancer Paternal Grandmother    ??? Asthma Sister    ??? Depression Sister    ??? Diabetes Sister    ??? Heart disease Sister    ??? Hypertension Sister    ??? Stroke Sister    ??? Birth defects Sister         Prime   ??? Clotting disorder Sister    ??? Ulcers Sister    ??? Cancer Maternal Grandmother         Breast   ??? Early death Sister    ??? Early death Brother    ??? Learning disabilities Son    ??? Learning disabilities Son    ??? Aneurysm Paternal Grandfather        ROS- 12 system review is negative other than what is specified in the History of Present Illness.      Objective:   Physical Exam  Vitals:    08/08/21 1339  BP: 138/76   Pulse: 66   Resp: 20   Temp: 36 ??C (96.8 ??F)   SpO2: 99%   Weight: (!) 125.9 kg (277 lb 8 oz)   Height: 165.1 cm (5' 5)      General-  Obese appearing female in no apparent distress.  Neurologic- Alert and oriented X3. ??Cranial nerve II-XII grossly intact.  HEENT- ??Normocephalic atraumatic head. ??No scleral icterus. ??Wearing face mask.  Neck- Supple, no carotid bruis, no JVD  Lungs- Clear to auscultation, no wheezes, rhonchi, or rhales.  Heart-????RRR, no MRG.  Extremities- ??No clubbing or cyanosis. ??No??pitting edema to LE??bilaterally.  Pulses-??2+??pulses in radial and dorsalis pedis bilaterally.  Psych- Normal mood, appropriate.    Laboratory data:    I have personally reviewed the images of the following diagnostic studies.      Electrocardiogram:  From 09/28/20 showed SR, borderline low voltage.    Echocardiogram:  From 06/2015 at [facility] showed LV systolic function and size were normal. Estimated EF 60-65%. Wall motion was normal; there were no regional wall motion abnormalities. Left ventricular diastolic function parameters were normal. LA normal in size. RV systolic function was normal. Pulmonary artery systolic pressure was within the normal range.     From 09/21/20 showed LV upper normal in size with normal wall thickness, LVEF 55%, normal RV systolic function, no significant valvular abnormalities.    Nuclear stress test:  PET CT stress test showed 10/11/20 showed normal myocardial perfusion study. No evidence of any significant ischemia or scar. Left ventricular systolic function is normal. Post stress the ejection fraction is > 60%. Minimal coronary calcifications are noted. Status post cholecystectomy.    Cardiac monitor:  From 10/16/20 showed patient had a min HR of 48 bpm, max HR of 169 bpm, and avg HR of 80 bpm. Predominant underlying rhythm was Sinus Rhythm. 1 run of Supraventricular Tachycardia occurred lasting 4 beats with a max rate of 169 bpm (avg 150 bpm). Isolated SVEs were rare (<1.0%), SVE Couplets were rare (<1.0%), and SVE Triplets were rare (<1.0%). Isolated VEs were rare (<1.0%), VE Couplets were rare (<1.0%), and no VE Triplets were Present. There were 55 patient triggered events. ??49 of the episodes corresponded to normal sinus rhythm. ??6 episodes corresponded to isolated PVCs.        Glade Stanford, AGNP-C  Cardiology Nurse Practitioner  [facility]

## 2021-08-08 NOTE — Unmapped (Signed)
Pt is in today for follow up.  She was seen at [facility] for possible TIA last Tuesday.  She is under a tremendous amount of stress and has asked for a number to a counselor.  I have given her the card of the counselor in our card holder.    She denies c/p or sob.

## 2021-08-08 NOTE — Unmapped (Addendum)
Quit smoking    Meet w/ therapist & PCP to discuss anxiety    Cholesterol looks great    Continue exercise

## 2021-08-15 ENCOUNTER — Ambulatory Visit: Admit: 2021-08-15 | Discharge: 2021-08-16 | Payer: MEDICARE

## 2021-08-15 DIAGNOSIS — F419 Anxiety disorder, unspecified: Principal | ICD-10-CM

## 2021-08-15 DIAGNOSIS — R42 Dizziness and giddiness: Principal | ICD-10-CM

## 2021-08-15 DIAGNOSIS — K589 Irritable bowel syndrome without diarrhea: Principal | ICD-10-CM

## 2021-08-15 DIAGNOSIS — Z716 Tobacco abuse counseling: Principal | ICD-10-CM

## 2021-08-15 MED ORDER — DULOXETINE 30 MG CAPSULE,DELAYED RELEASE
ORAL_CAPSULE | Freq: Every day | ORAL | 0 refills | 60 days | Status: CP
Start: 2021-08-15 — End: 2022-08-15
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
## 2021-08-16 NOTE — Unmapped (Signed)
Patient was sent vECM Referral for Patient to Self-Schedule [facility] Short Term Counseling appointment. Message was sent to patient MyChart.    Abstraction Result Flowsheet Data    This patient's last AWV date: [facility] Last Medicare Wellness Visit Date: 05/09/2020  This patients last WCC/CPE date: : 05/09/2020      Reason for Encounter  Reason for Encounter: Outreach  Primary Reason for Outreach: Behavioral Health Follow Up  Text Message: No  MyChart Message: No  Outreach Call Outcome: Other (BH//vECM Referral Patient Self-Scheduling.)

## 2021-08-27 NOTE — Unmapped (Signed)
Patient was sent vECM Referral for Patient to Self-Schedule [facility] Short Term Counseling appointment. Message was sent to patient MyChart.    Abstraction Result Flowsheet Data    This patient's last AWV date: [facility] Last Medicare Wellness Visit Date: 05/09/2020  This patients last WCC/CPE date: : 05/09/2020      Reason for Encounter  Reason for Encounter: Outreach  Primary Reason for Outreach: Behavioral Health Follow Up Carolin Guernsey Referral Patient Self-Scheduling)  Text Message: No  MyChart Message: No  Outreach Call Outcome: Other Carolin Guernsey Referral Patient Self-Scheduling)

## 2021-09-30 ENCOUNTER — Telehealth: Admit: 2021-09-30 | Discharge: 2021-10-01 | Payer: MEDICARE | Attending: Mental Health | Primary: Mental Health

## 2021-09-30 DIAGNOSIS — F411 Generalized anxiety disorder: Principal | ICD-10-CM

## 2021-09-30 DIAGNOSIS — F331 Major depressive disorder, recurrent, moderate: Principal | ICD-10-CM

## 2021-09-30 DIAGNOSIS — F4310 Post-traumatic stress disorder, unspecified: Principal | ICD-10-CM

## 2021-09-30 NOTE — Unmapped (Addendum)
Suicide & Crisis Hotlines       32 Suicide and Crisis Lifeline  Call or text 32  Online Chat: www.988lifeline.org    The Hopeline   Phone: 740-330-4085    Online chat: www.hopeline.com    [place]   Phone: 1-800-SUICIDE ((443)014-7685)    National Suicide Prevention Lifeline    Phone: 8-756-433-IRJJ (5172937022)   Online chat: www.suicidepreventionlifeline.org    Online chat (ages 77-24): https://gibson.com/        If you are experiencing a psychiatric emergency you can call 911 or go to your nearest emergency room. Many counties have a specialized crisis center where you can walk in for a crisis assessment and referrals to additional services. Appointments are not needed and they are open 24 hours a day, 7 days a week    24/7 Crisis [facility]  [address] [place] [address]  [phone]  Sunday - Saturday - 24 hours/day    [facility]   [address] [address]  [phone]  Sunday - Saturday - 24 hours/day    [facility]  [address] [place]  [phone]  Sunday - Saturday - 24 hours/day    How to Find Mental Health Treatment   If you have health insurance, look on your insurance card for the phone number or website to find a list of mental health providers who accept your insurance.      If you have Medicaid, Medicare, or no insurance, see below for your county's MCO. Call the phone number to make an appointment or get more information.    If you have Medicaid, you can also get extra help finding services from the [facility] Office. Call them at [phone] from 8 a.m. to 5 p.m., Monday through Friday, except State holidays.      Mobile Crisis   Crisis situations are often best resolved at home. Mobile Crisis Teams are available 24 hours a day in all counties. Professional counselors will speak with you and your family during a visit. They have an average response time of 2 hours.     [facility] Served: [place], Conservation officer, historic buildings, Scientist, physiological, [place], [place], [place], [place], [place], [place], [place], [place], [place], [place], [place], [place], [place], [place], [place], [place], [place], [place], [place], [place], [place], [place], Cecilie Kicks  Crisis Line: [phone]  Website: www.vayahealth.com    24/7 Mobile Crisis:    [facility] Mobile Crisis Team [place]: [phone]  Lyn Hollingshead, [place], [place], [place], [place], [place], [place], [place], [place], [place])    [place] [facility] Access to [employer] (Mobile Crisis Connection): [phone]  ([address], Lyn Hollingshead, Scientist, physiological, [place], [place], [place], [place], [place], [place], [place], [place], [place], [place], [place], [place], [place], [place], [place], [place], [place], [place], [place], [place], [place], [place], [place], [place], [place], [place], [place], [place])    [place] Resources:    [place]  [address] [place], [place]  Phone: [phone]  Hours: M,W,F 8am to 2pm    [facility]  [address] [place], [place]  Phone: [phone]   Hours: M-F 8am-5pm    [facility]  [address] [place], [place]  Phone: [phone]   Hours: M-F 8am-5pm    [facility]  [address] [place], [place]  Phone: [phone]   Hours: M-F 8am-5pm    Denny Peon [facility]  [address] [place], [place]  Phone: [phone]  Hours: M-F 8am-5pm    [facility]  [address], [place], [place]  Phone: [phone]  Hours: M-F 8am-5pm    [place]  [address]  Phone: [phone]  Hours: M-F 8:30am to 5:30pm     Paula Libra  [address], [place], [place]  Phone: [phone]   Hours: M-F 8am-5pm    [place]   [address] [place]  Phone: [phone]  Hours: M-Th, 8am to 3pm    [facility]  [address] [place], [place]   Phone: [phone]    [facility]  [address] [place], [place]  Phone: [phone]   Hours: M-F 9am-5pm    [facility]  [address] [place], [place]  Phone: [phone]   Hours: M-F 9am-5pm    [place]  [address], [place], [place]  Phone: [phone]    [facility]  [address] [place], [place]  Phone: [phone]   Hours: M-F 9am-5pm    Ardine Eng [phone]  No Walk-In    [facility]  [address] [place], [place]  Phone: [phone]  Hours: M-F 9am-5pm    [facility]  [address], [place]  Phone: [phone]  Hours: 8:30am to 5pm    [place] [facility]  [address] [place], [place]  Phone: [phone]  Hours: M-F 9am-1pm    [facility]  [address] [place], [place]  Phone: [phone]  Hours: M-F 9am-5pm    [facility]  [address], [place], [place]  Phone: [phone]   Hours: M-F 9am-5pm    [facility]  [address] [place], [place]  Phone: [phone]  Hours: M-F 9a-5p    [place]  [address] [place], [place]  Phone: [phone]   Hours: M-F 8am-5pm    [place]  [address] [place], [place]   Phone: [phone]   Hours: M-F 8am-5pm    [facility]  [address] [place], [place]  Phone: [phone]   Hours: M-F 9am-5pm    [place]--- CALL [phone] (No Walk-in)    [facility]  [address] [place], [place]   Phone: [phone]  Hours: M-F 9am-5pm    [facility]  [address] [place], [place]  Phone: [phone]   Hours: M-F 8am-8pm    [facility]  [address] [place], [place]  Phone: [phone]  Hours: M, W 11:30am to 1:30, F 8am to 12pm    [facility]  [address] [place], [place]  Phone: [phone]   Hours: M-F 9am-5pm    Francee Piccolo [facility]  [address] [place]  Phone: [phone]  Hours: M-F 9a to 5p    [facility]  [address] [place] [place], [place]  Phone: [phone]   Hours: M-F 8am-5pm    [place]  [address] [place], [place]  Phone: [phone]    [facility]  [address] [employer], [place]  Phone: [phone]   Hours: M-F 8am-5pm    [facility]  [address] [place], [place]  Phone: [phone]   Hours: M-F  8am-5pm     [place]  [address] [place], [place]  Phone: [phone]   Hours: M-F 9a-5p

## 2021-09-30 NOTE — Unmapped (Signed)
Virtual Care Outreach Results as of:  September 30, 2021 4:09 PM        Date of Patients Video: 09/30/21  Time of Patients Video Visit: 1600  Call Attempt's (1-5): 1  Did you speak with the patient?: no (Pt scheduled for direct link - links sent via email and text)  Confirmed patient's web browser: n/a  Confirmed MyChart Login: n/a  Confirmed Get Started Completed : n/a  Confirmed Camera: n/a  Confirmed Microphone: n/a  Confirmed Audio: n/a  Provided Support: n/a   Reason no support provided: unable to reach  Patient Satisfied: n/a
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
## 2021-10-25 NOTE — Unmapped (Signed)
[facility] Refill Coordination Note    Specialty Medication(s) to be Shipped:   General Specialty: Repatha    Other medication(s) to be shipped: No additional medications requested for fill at this time     Alyssa Herrera, DOB: 1976-02-24  Phone: [phone] (work)      All above HIPAA information was verified with patient.     Was a Nurse, learning disability used for this call? No    Completed refill call assessment today to schedule patient's medication shipment from the [facility] [phone]).  All relevant notes have been reviewed.     Specialty medication(s) and dose(s) confirmed: Regimen is correct and unchanged.   Changes to medications: Alyssa Herrera reports no changes at this time.  Changes to insurance: No  New side effects reported not previously addressed with a pharmacist or physician: None reported  Questions for the pharmacist: No    Confirmed patient received a Conservation officer, historic buildings and a Surveyor, mining with first shipment. The patient will receive a drug information handout for each medication shipped and additional FDA Medication Guides as required.       DISEASE/MEDICATION-SPECIFIC INFORMATION        For patients on injectable medications: Patient currently has 1 doses left.  Next injection is scheduled for 10/29/21.    SPECIALTY MEDICATION ADHERENCE     Medication Adherence    Patient reported X missed doses in the last month: 0  Specialty Medication: Repatha 140mg /mL  Informant: patient          Were doses missed due to medication being on hold? No. Alyssa Herrera denies missing any doses. Last fill was 05/17/83 for an 84 day supply. Will monitor adherence.     Repatha 140 mg/ml: 18 days of medicine on hand     REFERRAL TO PHARMACIST     Referral to the pharmacist: Not needed      [facility]     Shipping address confirmed in Epic.     Delivery Scheduled: Yes, Expected medication delivery date: 11/01/21.     Medication will be delivered via Same Day Courier to the prescription address in Epic WAM.    Camillo Flaming   [facility] Specialty Pharmacist

## 2021-10-27 ENCOUNTER — Emergency Department
Admission: EM | Admit: 2021-10-27 | Discharge: 2021-10-27 | Disposition: A | Discharge status: Home / Self Care | Payer: Medicare Other | Attending: Emergency Medicine | Admitting: Emergency Medicine

## 2021-10-27 ENCOUNTER — Encounter: Payer: Self-pay | Admitting: Emergency Medicine

## 2021-10-27 ENCOUNTER — Emergency Department: Payer: Medicare Other

## 2021-10-27 DIAGNOSIS — S40811A Abrasion of right upper arm, initial encounter: Secondary | ICD-10-CM | POA: Diagnosis not present

## 2021-10-27 DIAGNOSIS — S4991XA Unspecified injury of right shoulder and upper arm, initial encounter: Secondary | ICD-10-CM | POA: Diagnosis present

## 2021-10-27 DIAGNOSIS — S40812A Abrasion of left upper arm, initial encounter: Secondary | ICD-10-CM | POA: Insufficient documentation

## 2021-10-27 DIAGNOSIS — M25562 Pain in left knee: Secondary | ICD-10-CM | POA: Insufficient documentation

## 2021-10-27 DIAGNOSIS — M79671 Pain in right foot: Secondary | ICD-10-CM | POA: Insufficient documentation

## 2021-10-27 DIAGNOSIS — S80812A Abrasion, left lower leg, initial encounter: Secondary | ICD-10-CM | POA: Insufficient documentation

## 2021-10-27 DIAGNOSIS — W1789XA Other fall from one level to another, initial encounter: Secondary | ICD-10-CM | POA: Insufficient documentation

## 2021-10-27 DIAGNOSIS — Y9289 Other specified places as the place of occurrence of the external cause: Secondary | ICD-10-CM | POA: Insufficient documentation

## 2021-10-27 DIAGNOSIS — S80811A Abrasion, right lower leg, initial encounter: Secondary | ICD-10-CM | POA: Diagnosis not present

## 2021-10-27 DIAGNOSIS — W19XXXA Unspecified fall, initial encounter: Secondary | ICD-10-CM

## 2021-10-27 DIAGNOSIS — M545 Low back pain, unspecified: Secondary | ICD-10-CM | POA: Insufficient documentation

## 2021-10-27 MED ORDER — HYDROCODONE-ACETAMINOPHEN 5-325 MG PO TABS: 1.0000 | ORAL_TABLET | Freq: Four times a day (QID) | ORAL | 0 refills | Status: AC | PRN

## 2021-10-27 MED ORDER — OXYCODONE-ACETAMINOPHEN 5-325 MG PO TABS
1.0000 | ORAL_TABLET | Freq: Once | ORAL | Status: AC
  Administered 2021-10-27: 1 via ORAL
  Filled 2021-10-27: qty 1

## 2021-10-27 MED ORDER — ONDANSETRON 4 MG PO TBDP: 4.0000 mg | ORAL_TABLET | Freq: Three times a day (TID) | ORAL | 0 refills | Status: AC | PRN

## 2021-10-27 MED ORDER — KETOROLAC TROMETHAMINE 30 MG/ML IJ SOLN
30.0000 mg | Freq: Once | INTRAMUSCULAR | Status: AC
  Administered 2021-10-27: 30 mg via INTRAMUSCULAR
  Filled 2021-10-27: qty 1

## 2021-10-27 MED ORDER — ONDANSETRON 4 MG PO TBDP
4.0000 mg | ORAL_TABLET | Freq: Once | ORAL | Status: AC
  Administered 2021-10-27: 4 mg via ORAL
  Filled 2021-10-27: qty 1

## 2021-10-27 NOTE — ED Notes (Signed)
Pt leaving as her sister is out front waiting for her, advised on 15-30 mins stay after IM injection to assess for possible reaction, pt reported "I've had this shot many times, I never have a reaction, I'm good to go, my sister is waiting". Pt ambulated out to [place] on her own, steady gait noted, advised on return for any signs of reaction. Pt verbalized understanding.  ?

## 2021-10-27 NOTE — ED Provider Notes (Signed)
? ?[facility] ?Provider Note ? ?Patient Contact: 10:34 PM (approximate) ? ? ?History  ? ?Leg Injury ? ? ?HPI ? ?Kendra Clark is a 46 y.o. female presents to the emergency department after patient had a mechanical fall from her porch.  Patient is primarily complaining of left knee pain, right foot pain and low back pain.  She has multiple abrasions along the upper and lower extremities.  No chest pain, chest tightness or abdominal pain. ? ?  ? ? ?Physical Exam  ? ?Triage Vital Signs: ?ED Triage Vitals  ?Enc Vitals Group  ?   BP 10/27/21 2116 108/88  ?   Pulse Rate 10/27/21 2116 72  ?   Resp 10/27/21 2116 18  ?   Temp 10/27/21 2116 98.4 ?F (36.9 ?C)  ?   Temp Source 10/27/21 2116 Oral  ?   SpO2 10/27/21 2116 96 %  ?   Weight 10/27/21 2117 276 lb (125.2 kg)  ?   Height 10/27/21 2117 5\' 5"  (1.651 m)  ?   Head Circumference --   ?   Peak Flow --   ?   Pain Score 10/27/21 2117 9  ?   Pain Loc --   ?   Pain Edu? --   ?   Excl. in [place]? --   ? ? ?Most recent vital signs: ?Vitals:  ? 10/27/21 2116 10/27/21 2339  ?BP: 108/88 128/72  ?Pulse: 72 63  ?Resp: 18 20  ?Temp: 98.4 ?F (36.9 ?C) 97.7 ?F (36.5 ?C)  ?SpO2: 96% 96%  ? ? ? ?General: Alert and in no acute distress. ?Eyes:  PERRL. EOMI. ?Head: No acute traumatic findings ?ENT: ?     Ears: Tms are pearly.  ?     Nose: No congestion/rhinnorhea. ?     Mouth/Throat: Mucous membranes are moist. ?Neck: No stridor. No cervical spine tenderness to palpation. ?Cardiovascular:  Good peripheral perfusion ?Respiratory: Normal respiratory effort without tachypnea or retractions. Lungs CTAB. Good air entry to the bases with no decreased or absent breath sounds. ?Gastrointestinal: Bowel sounds ?4 quadrants. Soft and nontender to palpation. No guarding or rigidity. No palpable masses. No distention. No CVA tenderness. ?Musculoskeletal: Full range of motion to all extremities.  ?Neurologic:  No gross focal neurologic deficits are appreciated.  ?Skin: Patient has  abrasions along the upper and lower extremities. ? ? ?ED Results / Procedures / Treatments  ? ?Labs ?(all labs ordered are listed, but only abnormal results are displayed) ?Labs Reviewed - No data to display ? ? ? ?RADIOLOGY ? ?I personally viewed and evaluated these images as part of my medical decision making, as well as reviewing the written report by the radiologist. ? ?ED Provider Interpretation: I personally interpreted x-rays of patient's lumbar spine, left knee and right foot and no acute bony abnormalities were visualized. ? ? ?PROCEDURES: ? ?Critical Care performed: No ? ?Procedures ? ? ?MEDICATIONS ORDERED IN ED: ?Medications  ?oxyCODONE-acetaminophen (PERCOCET/ROXICET) 5-325 MG per tablet 1 tablet (1 tablet Oral Given 10/27/21 2255)  ?ondansetron (ZOFRAN-ODT) disintegrating tablet 4 mg (4 mg Oral Given 10/27/21 2255)  ?ketorolac (TORADOL) 30 MG/ML injection 30 mg (30 mg Intramuscular Given 10/27/21 2336)  ? ? ? ?IMPRESSION / MDM / ASSESSMENT AND PLAN / ED COURSE  ?I reviewed the triage vital signs and the nursing notes. ?             ?               ? ?  Differential diagnosis includes, but is not limited to, contusion, abrasions, fracture ? ?Assessment and plan: ?Fall:  ?46 year old female presents to the emergency department after a mechanical fall from her porch.  X-rays unremarkable.  She was discharged with a short course of percocet for pain. Wound care was given in ED. Return precautions were given to return with new or worsening symptoms.  ?  ? ? ?FINAL CLINICAL IMPRESSION(S) / ED DIAGNOSES  ? ?Final diagnoses:  ?Fall, initial encounter  ? ? ? ?Rx / DC Orders  ? ?ED Discharge Orders   ? ?      Ordered  ?  HYDROcodone-acetaminophen (NORCO) 5-325 MG tablet  Every 6 hours PRN       ? 10/27/21 2327  ?  ondansetron (ZOFRAN-ODT) 4 MG disintegrating tablet  Every 8 hours PRN       ? 10/27/21 2327  ? ?  ?  ? ?  ? ? ? ?Note:  This document was prepared using Dragon voice recognition software and may include  unintentional dictation errors. ?  ?Lannie Fields, PA-C ?10/27/21 2356 ? ?  ?Rada Hay, MD ?10/29/21 0012 ? ?

## 2021-10-27 NOTE — ED Triage Notes (Signed)
Pt here from home with c/o left left leg pain after falling on the back porch pt had some abrasions to the knee and shins of both legs ,  ?

## 2021-10-31 ENCOUNTER — Emergency Department
Admission: EM | Admit: 2021-10-31 | Discharge: 2021-10-31 | Disposition: A | Discharge status: Home / Self Care | Payer: Medicare Other | Attending: Emergency Medicine | Admitting: Emergency Medicine

## 2021-10-31 ENCOUNTER — Emergency Department: Payer: Medicare Other

## 2021-10-31 ENCOUNTER — Other Ambulatory Visit: Payer: Self-pay

## 2021-10-31 DIAGNOSIS — L03115 Cellulitis of right lower limb: Secondary | ICD-10-CM | POA: Diagnosis not present

## 2021-10-31 DIAGNOSIS — W19XXXA Unspecified fall, initial encounter: Secondary | ICD-10-CM | POA: Diagnosis not present

## 2021-10-31 DIAGNOSIS — M79604 Pain in right leg: Secondary | ICD-10-CM | POA: Diagnosis not present

## 2021-10-31 DIAGNOSIS — I10 Essential (primary) hypertension: Secondary | ICD-10-CM | POA: Diagnosis not present

## 2021-10-31 DIAGNOSIS — S81811A Laceration without foreign body, right lower leg, initial encounter: Secondary | ICD-10-CM | POA: Insufficient documentation

## 2021-10-31 DIAGNOSIS — S8991XA Unspecified injury of right lower leg, initial encounter: Secondary | ICD-10-CM | POA: Diagnosis present

## 2021-10-31 LAB — POC URINE PREG, ED: Preg Test, Ur: NEGATIVE

## 2021-10-31 MED ORDER — SULFAMETHOXAZOLE-TRIMETHOPRIM 800-160 MG PO TABS: 1.0000 | ORAL_TABLET | Freq: Two times a day (BID) | ORAL | 0 refills | Status: AC

## 2021-10-31 MED ORDER — OXYCODONE HCL 5 MG PO TABS
5.0000 mg | ORAL_TABLET | Freq: Once | ORAL | Status: AC
  Administered 2021-10-31: 5 mg via ORAL
  Filled 2021-10-31: qty 1

## 2021-10-31 NOTE — Unmapped (Signed)
Regarding: Pt has knot formed on right leg from a fall and it's feverish. Call x2 no nurse pick up  ----- Message from Melissa Montane sent at 10/31/2021  9:22 AM EDT -----  Pt seen at ER Sunday 10/27/21 from a fall. Now a knot has formed on right leg beside her vein and it's feverish. Pt states has no tranporation of getting to her appt. She ask if a Virtual/Video call could be set up so she could show her leg. Called nurse connect x2, no nurse pick up. Reach pt at [phone]

## 2021-10-31 NOTE — Unmapped (Addendum)
Pt seen at ER "Sunday 10/27/21 from a fall. Now a knot has formed on right leg and it's feverish. Pt states she has no tranporation of getting to an appt. She ask if a Virtual/Video call could be set up so she could show her leg.     Called Nurse connect x2 but no nurse picked up. Patient is requesting for the Nurse to contact them in regards to advice/appt  Please contact Patient by Cell Phone: [phone]       12" :15 PM   Pt fell over weekend--having shooting pain in foot, stinging pain in legs.  Patient states developed small knot after fall near a varicose vein in the lower extremities.  Since visit to ER over weekend, now knot has been getting bigger, more swollen, more warmth.  Patient's son taking patient to [facility] ER for further evaluation.  Patient verbalized understanding.      Valorie Roosevelt, FNP

## 2021-10-31 NOTE — Unmapped (Signed)
Recent:   What is the date of your last related visit?  05.14.2023, ED, Fall  Related acute medications Rx'd:  none  Home treatment tried:  neosporin, bandage, saline      Relevant:   Allergies: Penicillins, Tramadol, Gabapentin, Pravastatin, and Simvastatin  Medications: na   Health History: na  Weight: na        Reason for Disposition   Large swelling or bruise and size > palm of person's hand    Answer Assessment - Initial Assessment Questions  1. MECHANISM: How did the injury happen? (e.g., twisting injury, direct blow)       Trip and fall while walking  2. ONSET: When did the injury happen? (Minutes or hours ago)       05.14.2023  3. LOCATION: Where is the injury located?       Medial aspect of right shin  4. APPEARANCE of INJURY: What does the injury look like?  (e.g., deformity of leg)      2 knots have come up and feels feverish  5. SEVERITY: Can you put weight on that leg? Can you walk?       Able to walk  6. SIZE: For cuts, bruises, or swelling, ask: How large is it? (e.g., inches or centimeters)       Knots-dollar  7. PAIN: Is there pain? If Yes, ask: How bad is the pain?  What does it keep you from doing? (e.g., Scale 1-10; or mild, moderate, severe)    -  NONE: (0): no pain    -  MILD (1-3): doesn't interfere with normal activities     -  MODERATE (4-7): interferes with normal activities (e.g., work or school) or awakens from sleep, limping     -  SEVERE (8-10): excruciating pain, unable to do any normal activities, unable to walk      7/10  8. TETANUS: For any breaks in the skin, ask: When was the last tetanus booster?      na  9. OTHER SYMPTOMS: Do you have any other symptoms?       Area feels feverish  10. PREGNANCY: Is there any chance you are pregnant? When was your last menstrual period?        no    Protocols used: Leg Injury-ADULT-[place]

## 2021-10-31 NOTE — Discharge Instructions (Addendum)
We have started some antibiotics to help with cellulitis but there is no evidence of blood clots on the ultrasound you could come in to the ER for worsening pain, fevers or any other concerns

## 2021-10-31 NOTE — ED Notes (Signed)
EDP at bedside. Pt to ED for burning pain to RLE on medial part of calf, in same area where pt has varicose veins, located next to wound to anterior calf that was from fall. Pt is still able to walk. Pt was seen already for wound but her PCP did not prescribe abx. Wound examined by provider. Pt has been cleaning wound at home with saline and soap and water.

## 2021-10-31 NOTE — ED Provider Notes (Signed)
[facility] Provider Note    Event Date/Time   First MD Initiated Contact with Patient 10/31/21 1326     (approximate)   History   Leg Pain   HPI  Kendra Clark is a 46 y.o. female with hypertension, hyperlipidemia who comes in with concern for right leg pain.  Patient reports increasing swelling and pain to the right leg.  She had a fall a few days ago with an abrasion to the leg but states that she is not having some pain and feels a lump next to the abrasion.  Denies history of blood clots.  I reviewed patient's cardiology note from 08/08/2021 where patient is on aspirin She reports still been able to ambulate and denies any recurrent falls  Physical Exam   Triage Vital Signs: ED Triage Vitals [10/31/21 1308]  Enc Vitals Group     BP (!) 143/76     Pulse Rate 82     Resp 18     Temp 98.4 F (36.9 C)     Temp Source Oral     SpO2 95 %     Weight 278 lb (126.1 kg)     Height 5\' 5"  (1.651 m)     Head Circumference      Peak Flow      Pain Score 9     Pain Loc      Pain Edu?      Excl. in [place]?     Most recent vital signs: Vitals:   10/31/21 1308  BP: (!) 143/76  Pulse: 82  Resp: 18  Temp: 98.4 F (36.9 C)  SpO2: 95%     General: Awake, no distress.  CV:  Good peripheral perfusion.  Resp:  Normal effort.  Abd:  No distention.  Other:  No significant swelling noted to the leg.  2+ distal pulse.  There is an abrasion with a little bit of drainage noted from a superficial laceration.  No obvious lump felt in the leg.   ED Results / Procedures / Treatments   Labs (all labs ordered are listed, but only abnormal results are displayed) Labs Reviewed  POC URINE PREG, ED     RADIOLOGY I have reviewed and interpreted the ultrasound personally and dont see obvious dvt pending read    PROCEDURES:  Critical Care performed: No  Procedures   MEDICATIONS ORDERED IN ED: Medications  oxyCODONE (Oxy IR/ROXICODONE) immediate  release tablet 5 mg (5 mg Oral Given 10/31/21 1335)     IMPRESSION / MDM / ASSESSMENT AND PLAN / ED COURSE  I reviewed the triage vital signs and the nursing notes.   Differential includes DVT, cellulitis.  Good distal pulse unlikely arterial issue.  We will give a dose of oxycodone get ultrasound to further evaluate if negative will treat with antibiotics for cellulitis  Ultrasound negative for DVT.  We will give a course of antibiotics for cellulitis.  Patient allergic to penicillin including shortness of breath and I cannot see that she is ever got Keflex before so we will do Bactrim.  Patient had kidney function checked in February that was normal that I did review personally on 2/21  Updated patient she felt comfortable with this plan   FINAL CLINICAL IMPRESSION(S) / ED DIAGNOSES   Final diagnoses:  Cellulitis of right lower extremity     Rx / DC Orders   ED Discharge Orders          Ordered    sulfamethoxazole-trimethoprim (  BACTRIM DS) 800-160 MG tablet  2 times daily        10/31/21 1551             Note:  This document was prepared using Dragon voice recognition software and may include unintentional dictation errors.   Concha Se, MD 10/31/21 [phone]

## 2021-10-31 NOTE — ED Triage Notes (Addendum)
Pt with c/o left leg pain and knot in right calf. Pt states she fell this past Sunday, and was seen in the ED. Pt states she takes a baby asa every day but is not on blood thinners. Pt states she thinks the knot has gotten bigger, old healing scabs noted on left shin, vericose veins noted on right calf in area where pt states she notices the knot. Pt states it hurts when she walks.No redness noted.

## 2021-11-01 MED FILL — REPATHA SURECLICK 140 MG/ML SUBCUTANEOUS PEN INJECTOR: SUBCUTANEOUS | 84 days supply | Qty: 6 | Fill #2

## 2021-11-18 ENCOUNTER — Ambulatory Visit: Admit: 2021-11-18 | Discharge: 2021-11-19 | Payer: MEDICARE

## 2021-11-18 DIAGNOSIS — M549 Dorsalgia, unspecified: Principal | ICD-10-CM

## 2021-11-18 DIAGNOSIS — E559 Vitamin D deficiency, unspecified: Principal | ICD-10-CM

## 2021-11-18 DIAGNOSIS — L089 Local infection of the skin and subcutaneous tissue, unspecified: Principal | ICD-10-CM

## 2021-11-18 DIAGNOSIS — M5410 Radiculopathy, site unspecified: Principal | ICD-10-CM

## 2021-11-18 DIAGNOSIS — R109 Unspecified abdominal pain: Principal | ICD-10-CM

## 2021-11-18 DIAGNOSIS — J45909 Unspecified asthma, uncomplicated: Principal | ICD-10-CM

## 2021-11-18 DIAGNOSIS — N760 Acute vaginitis: Principal | ICD-10-CM

## 2021-11-18 DIAGNOSIS — K589 Irritable bowel syndrome without diarrhea: Principal | ICD-10-CM

## 2021-11-18 DIAGNOSIS — G8929 Other chronic pain: Principal | ICD-10-CM

## 2021-11-18 DIAGNOSIS — M2550 Pain in unspecified joint: Principal | ICD-10-CM

## 2021-11-18 DIAGNOSIS — K219 Gastro-esophageal reflux disease without esophagitis: Principal | ICD-10-CM

## 2021-11-18 MED ORDER — IPRATROPIUM 0.5 MG-ALBUTEROL 3 MG (2.5 MG BASE)/3 ML NEBULIZATION SOLN
Freq: Four times a day (QID) | RESPIRATORY_TRACT | 0 refills | 0 days | Status: CN | PRN
Start: 2021-11-18 — End: ?

## 2021-11-18 MED ORDER — OMEPRAZOLE 20 MG CAPSULE,DELAYED RELEASE
ORAL_CAPSULE | Freq: Every day | ORAL | 0 refills | 90.00000 days | Status: CN
Start: 2021-11-18 — End: ?

## 2021-11-18 MED ORDER — DICLOFENAC SODIUM 50 MG TABLET,DELAYED RELEASE
ORAL_TABLET | Freq: Two times a day (BID) | ORAL | 1 refills | 30 days | Status: CP
Start: 2021-11-18 — End: 2022-11-18

## 2021-11-18 MED ORDER — FLUCONAZOLE 150 MG TABLET
ORAL_TABLET | Freq: Once | ORAL | 0 refills | 1.00000 days | Status: CP
Start: 2021-11-18 — End: 2021-11-18

## 2021-11-18 MED ORDER — FLUTICASONE PROPIONATE 110 MCG/ACTUATION HFA AEROSOL INHALER
Freq: Two times a day (BID) | RESPIRATORY_TRACT | 2 refills | 0 days | Status: CP
Start: 2021-11-18 — End: 2022-11-18

## 2021-11-18 MED ORDER — ERGOCALCIFEROL (VITAMIN D2) 1,250 MCG (50,000 UNIT) CAPSULE
ORAL_CAPSULE | ORAL | 2 refills | 84.00000 days | Status: CN
Start: 2021-11-18 — End: ?

## 2021-11-18 MED ORDER — SULFAMETHOXAZOLE 800 MG-TRIMETHOPRIM 160 MG TABLET
ORAL_TABLET | Freq: Two times a day (BID) | ORAL | 0 refills | 5 days | Status: CP
Start: 2021-11-18 — End: 2021-11-23

## 2021-11-18 MED ADMIN — ketorolac (TORADOL) injection 30 mg: 30 mg | INTRAVENOUS | @ 19:00:00 | Stop: 2021-11-18

## 2021-11-18 NOTE — Unmapped (Signed)
Assessment and Plan:     Alyssa Herrera was seen today for follow-up.    Diagnoses and all orders for this visit:    Skin infection  ER follow-up:          -     Abrasions on bilateral shins improving.        -     Given mild tenderness around borders of abrasions on right shin-prescription for Bactrim twice daily for 5 days provided to patient.        -     Home Care advice reassurance, return instructions discussed with patient. Pt susceptible to yeast infections following antibiotics--fluconazole provided to patient.        -     Cr 0.70 (02/23)  -     sulfamethoxazole-trimethoprim (BACTRIM DS) 800-160 mg per tablet; Take 1 tablet (160 mg of trimethoprim total) by mouth every twelve (12) hours for 5 days.    Vitamin D deficiency          -     Last Vitamin D level: 13. 9 (06/20)  -     Vitamin D 25 Hydroxy (25OH D2 + D3)    Back pain, unspecified back location, unspecified back pain laterality, unspecified chronicity  Acute low back pain with radicular symptoms, duration less than 6 weeks  Arthralgia, unspecified joint            -     Last seen by spine center January, 2023.  Missed follow-up appointment and PT appointments due to lack of transportation        -     Agreeable to increase dose of Cymbalta from 30 mg to 60 mg.        -    Discussed long-term risks of oral NSAIDs given comorbid conditions, discussed combination of spinal clinic, physical therapy, stretches, water therapy, healthy weight for chronic pains.        -     Per patient request, referred to [facility] in [place].  -     diclofenac (VOLTAREN) 50 MG EC tablet; Take 1 tablet (50 mg total) by mouth Two (2) times a day. For arthritis pain.  -     ketorolac (TORADOL) injection 30 mg    Gastroesophageal reflux disease, unspecified whether esophagitis present  Irritable bowel syndrome, unspecified type  Chronic abdominal pain            -     Adherent with PPI Linzess.  Per patient preference referred [facility]        -    Suspect combination of medications, mental health may be contributing.  -     Gastroenterology; Future    Asthma, unspecified asthma severity, unspecified whether complicated, unspecified whether persistent          -     Patient using Spiriva--Per chart review, no prescriptions from this office.  Using Flovent, using nebulizer sparingly.  -     fluticasone propionate (FLOVENT HFA) 110 mcg/actuation inhaler; Inhale 2 puffs Two (2) times a day. As needed for cough    Coronary artery calcification  Mitral valve disease:  Adherent with 81 mg ASA, repatha, 80 mg atorvastatin, 20 mg lasix  Last cardiology visit: 2/23, needs follow-up 08/23  02/23: A1c: 5.5, Cr 0.70, K 4.4, LDL 58    Tobacco use:       -1 to 5 cigarettes a day, plans on quit line.  Using nicotine patches.     -  Feels recent fall, chronic pain increased cigarette use more.      -Aware of importance of cutting back/quitting.    Barriers to recommended plan: transportation, financial    Return if symptoms worsen or fail to improve, for 3-4 months follow-up.      Subjective:     HPI: Alyssa Herrera is a 46 y.o. female here for Follow-up (Fall seen in ED and mood follow up ).    ER follow-up:    Seen in [facility] ER May 14 and May 18  Status post fall, sustained abrasions to bilateral shins, right shin more greater than left shin.  Given 5 days of Bactrim, feels overall abrasions on left shin improving, right shin--abrasions drying up, has noticed some soreness around wound edges, scant drainage noted with pressure.  Bilateral lower extremity Dopplers were negative for DVTs.    Mental health:      Started 30 mg cymbalta, has not increased dose since 03/23  Plans to increase dose soon--next week  Per chart review was diagnosed with bipolar 1, depression-was taking Geodon 40mg , hydroxyzine  Last taken 05/20      Back pain, sciatica pain:  Worse since fall--needs to call PT  Missed spine center follow-up appointment due to lack of transportion--plans to re-schedule appointment  Last seen 1/23  Trials of medication;  Pt has tried-steroid spinal injections, gabapentin, Cymbalta- not effective/stopped due to side effects  Oral steroids upset stomach, does not want anything that will cause drowsiness--declined re-try of lyrica  Lumbar x-ray (10/21); Grade 1 anterolisthesis of L4 on L5 with associated facet arthropathy.   Currently taking 50 mg oral diclofenac twice daily  Feels pain was improved with healthy weight, water therapy, physical therapy.  Due to recent fall, lack of transportation has not been able to engage in these activities.    Stomach pain:  Swelling in stomach, pain in stomach--left side of stomach, radiates to back  Taking linzess, PPI--every day, open to GI referral --[facility] in [place]  Seen by [facility] September, 2022; declined anal rectal manometry.  [phone]    Coronary artery calcification  Mitral valve disease:  Adherent with 81 mg ASA, repatha, 80 mg atorvastatin, 20 mg lasix  Last cardiology visit: 2/23, needs follow-up 08/23  02/23: A1c: 5.5, Cr 0.70, K 4.4, LDL 58    Tobacco:  Smoking more due to fall/pain, down to 5 cigarettes/day  Plans on QUITLINE, wearing patches    Asthma:    Adherent with Flovent 2 puffs twice daily, using Spiriva  Using nebulizer sparingly.  No record of Spiriva prescription from this office.        I have reviewed past medical, surgical, medications, allergies, social and family histories today and updated them in Epic where appropriate.    ROS:   PHQ-9 PHQ-9 TOTAL SCORE   11/18/2021   3:00 PM 15   09/30/2021   4:00 PM 19   11/13/2020   8:00 AM 4   08/13/2020  10:00 AM 19   05/09/2020   8:43 AM 10     GAD7 Total Score GAD-7 Total Score   11/18/2021   3:00 PM 16   09/30/2021   4:00 PM 20   08/13/2020  10:00 AM 17   08/25/2019   1:00 PM 21     Review of systems negative unless otherwise noted as per HPI.      Objective:     Vitals:    11/18/21 1432   BP:  130/72   Pulse: 68   Temp: 37.1 ??C (98.7 ??F)   SpO2: 97%     Body mass index is 48.09 kg/m??.    Physical Exam  Vitals and nursing note reviewed.   Constitutional:       General: She is not in acute distress.     Appearance: Normal appearance. She is not ill-appearing, toxic-appearing or diaphoretic.   HENT:      Head: Normocephalic and atraumatic.   Cardiovascular:      Rate and Rhythm: Normal rate and regular rhythm.      Pulses: Normal pulses.      Heart sounds: Normal heart sounds.   Pulmonary:      Effort: Pulmonary effort is normal.      Breath sounds: Normal breath sounds.   Musculoskeletal:      Cervical back: Normal range of motion and neck supple.   Skin:     Capillary Refill: Capillary refill takes less than 2 seconds.      Comments: Superficial, drying abrasions noted bilateral shins  Right shin, greater than left shin    R shin: Abrasions 10 cm by 1cm--tenderness noted borders of wound.    Pedal pulses intact bilaterally, cap refill brisk bilaterally.   Neurological:      General: No focal deficit present.      Mental Status: She is alert and oriented to person, place, and time. Mental status is at baseline.   Psychiatric:         Mood and Affect: Mood normal.         Behavior: Behavior normal.         Thought Content: Thought content normal.         Judgment: Judgment normal.          Medication adherence and barriers to the treatment plan have been addressed. Opportunities to optimize healthy behaviors have been discussed. Patient / caregiver voiced understanding.   I personally spent 35 minutes face-to-face and non-face-to-face in the care of this patient, which includes all pre, intra, and post visit time on the date of service.  [name], DNP, FNP-C  [facility]  [phone] (F)    Note - This record has been created using [employer]. Chart creation errors have been sought, but may not always have been located. Such creation errors do not reflect on the standard of medical care.

## 2021-11-20 LAB — VITAMIN D 25 HYDROXY: VITAMIN D, TOTAL (25OH): 14.3 ng/mL — ABNORMAL LOW (ref 20.0–80.0)

## 2021-12-25 ENCOUNTER — Other Ambulatory Visit: Payer: Self-pay

## 2021-12-25 ENCOUNTER — Emergency Department
Admission: EM | Admit: 2021-12-25 | Discharge: 2021-12-25 | Disposition: A | Discharge status: Home / Self Care | Payer: Medicare Other | Attending: Emergency Medicine | Admitting: Emergency Medicine

## 2021-12-25 DIAGNOSIS — M5431 Sciatica, right side: Secondary | ICD-10-CM

## 2021-12-25 DIAGNOSIS — M5441 Lumbago with sciatica, right side: Secondary | ICD-10-CM | POA: Diagnosis not present

## 2021-12-25 DIAGNOSIS — J45909 Unspecified asthma, uncomplicated: Secondary | ICD-10-CM | POA: Insufficient documentation

## 2021-12-25 DIAGNOSIS — M545 Low back pain, unspecified: Secondary | ICD-10-CM | POA: Diagnosis present

## 2021-12-25 MED ORDER — LIDOCAINE 5 % EX PTCH
1.0000 | MEDICATED_PATCH | CUTANEOUS | Status: DC
  Administered 2021-12-25: 1 via TRANSDERMAL
  Filled 2021-12-25: qty 1

## 2021-12-25 MED ORDER — PREDNISONE 10 MG PO TABS: ORAL_TABLET | ORAL | 0 refills | Status: DC

## 2021-12-25 MED ORDER — KETOROLAC TROMETHAMINE 30 MG/ML IJ SOLN
30.0000 mg | Freq: Once | INTRAMUSCULAR | Status: AC
  Administered 2021-12-25: 30 mg via INTRAMUSCULAR
  Filled 2021-12-25: qty 1

## 2021-12-25 MED ORDER — LIDOCAINE 5 % EX PTCH: 1.0000 | MEDICATED_PATCH | Freq: Two times a day (BID) | CUTANEOUS | 0 refills | Status: DC

## 2021-12-25 NOTE — ED Triage Notes (Signed)
Pt here with back pain since Monday night. Pt states she slept in her recliner Monday night on the wrong side and began having back pain. Pt has sciatica and states the pain starts in her back and radiates to the front of her pelvis. Pt states it hurts to ambulate.

## 2021-12-25 NOTE — ED Provider Notes (Signed)
[facility] Provider Note    Event Date/Time   First MD Initiated Contact with Patient 12/25/21 1255     (approximate)   History   Back Pain   HPI  Kendra Clark is a 46 y.o. female   to the ED with complaint of low back pain with radiculopathy into her right lower extremity.  Patient states she has a history of sciatica and has irritated her back by sleeping in a recliner.  She denies any recent injuries to her back.  Patient continues to ambulate but states that it hurts with movement.  No history of incontinence of bowel or bladder or saddle anesthesias.  She has taken over-the-counter medications without any relief.  Patient has a history of anxiety, asthma, bipolar disorder, degenerative disc disease, IBS, PTSD.      Physical Exam   Triage Vital Signs: ED Triage Vitals  Enc Vitals Group     BP 12/25/21 1140 (!) 137/59     Pulse Rate 12/25/21 1140 76     Resp 12/25/21 1140 18     Temp 12/25/21 1140 97.9 F (36.6 C)     Temp Source 12/25/21 1140 Oral     SpO2 12/25/21 1140 94 %     Weight 12/25/21 1140 289 lb (131.1 kg)     Height 12/25/21 1140 5\' 5"  (1.651 m)     Head Circumference --      Peak Flow --      Pain Score 12/25/21 1143 10     Pain Loc --      Pain Edu? --      Excl. in GC? --     Most recent vital signs: Vitals:   12/25/21 1140  BP: (!) 137/59  Pulse: 76  Resp: 18  Temp: 97.9 F (36.6 C)  SpO2: 94%     General: Awake, no distress.  CV:  Good peripheral perfusion.  Resp:  Normal effort.  Abd:  No distention.  Other:  On examination of the back there is no gross deformity however there is marked tenderness on palpation of the right SI joint area and surrounding muscle tissue.  Range of motion is slow and guarded secondary to increased pain.  Patient is able to move lower extremity slowly but complains of pain.  Patient is able to flex and extend her foot without difficulty.  No skin discoloration.   ED Results /  Procedures / Treatments   Labs (all labs ordered are listed, but only abnormal results are displayed) Labs Reviewed - No data to display     PROCEDURES:  Critical Care performed:   Procedures   MEDICATIONS ORDERED IN ED: Medications  lidocaine (LIDODERM) 5 % 1 patch (1 patch Transdermal Patch Applied 12/25/21 1402)  ketorolac (TORADOL) 30 MG/ML injection 30 mg (30 mg Intramuscular Given 12/25/21 1400)     IMPRESSION / MDM / ASSESSMENT AND PLAN / ED COURSE  I reviewed the triage vital signs and the nursing notes.   Differential diagnosis includes, but is not limited to, sciatica with right leg radiculopathy, low back pain, hip pain, muscle skeletal pain.  46 year old female presents to the ED with complaint of right lower back pain radiating into her right lower extremity.  Patient has a history of sciatica.  She denies any recent injury.  In the past she has taken prednisone with relief of her symptoms and also Toradol shot.  Patient has tried over-the-counter medication without any relief.  She denies any  symptoms that would be suggestive of cauda equina.  An injection of Toradol was given to her while in the ED along with a Lidoderm patch.  Patient was instructed to begin taking the prednisone taper and also to obtain the over-the-counter lidocaine patches as they may be much cheaper than the prescription.  She is encouraged to use ice or heat to her back and follow-up with her PCP if any continued problems.      Patient's presentation is most consistent with acute, uncomplicated illness.  FINAL CLINICAL IMPRESSION(S) / ED DIAGNOSES   Final diagnoses:  Sciatica of right side     Rx / DC Orders   ED Discharge Orders          Ordered    predniSONE (DELTASONE) 10 MG tablet        12/25/21 1415    lidocaine (LIDODERM) 5 %  Every 12 hours        12/25/21 1425             Note:  This document was prepared using Dragon voice recognition software and may include  unintentional dictation errors.   Tommi Rumps, PA-C 12/25/21 1426    Georga Hacking, MD 12/25/21 [phone]

## 2021-12-25 NOTE — Discharge Instructions (Addendum)
Follow-up with your primary care provider if any continued problems or concerns.  You may use ice or heat to your back as needed for discomfort.  Also the lidocaine patches that are over-the-counter are available if you are insurance company does not pay for the prescription.  Begin taking the prednisone in a taper dose starting with 6 tablets today and tapering down over the next 6 days.

## 2022-01-13 DIAGNOSIS — E559 Vitamin D deficiency, unspecified: Principal | ICD-10-CM

## 2022-01-13 MED ORDER — ERGOCALCIFEROL (VITAMIN D2) 1,250 MCG (50,000 UNIT) CAPSULE
ORAL_CAPSULE | 2 refills | 0 days
Start: 2022-01-13 — End: ?

## 2022-01-14 MED ORDER — ERGOCALCIFEROL (VITAMIN D2) 1,250 MCG (50,000 UNIT) CAPSULE
ORAL_CAPSULE | 2 refills | 0 days | Status: CP
Start: 2022-01-14 — End: ?

## 2022-01-27 NOTE — Unmapped (Signed)
[facility] has made a second and final attempt to reach this patient to refill the following medication:Repatha.      We have left voicemails on the following phone numbers: [phone] and have sent a text message to the following phone numbers: [phone].    Dates contacted: 01/20/22, 01/27/22  Last scheduled delivery: 11/01/21    The patient may be at risk of non-compliance with this medication. The patient should call the [facility] at [phone]  Option 4, then Option 2 (all other specialty patients) to refill medication.    Valissa Lyvers Celedonio Savage   [facility]

## 2022-01-29 NOTE — Unmapped (Signed)
[facility] Refill Coordination Note    Specialty Medication(s) to be Shipped:   General Specialty: Repatha    Other medication(s) to be shipped: No additional medications requested for fill at this time     Alyssa Herrera, DOB: [birth year]  Phone: [phone] (work)      All above HIPAA information was verified with patient.     Was a Nurse, learning disability used for this call? No    Completed refill call assessment today to schedule patient's medication shipment from the [facility] [phone]).  All relevant notes have been reviewed.     Specialty medication(s) and dose(s) confirmed: Regimen is correct and unchanged.   Changes to medications: Finna reports no changes at this time.  Changes to insurance: No  New side effects reported not previously addressed with a pharmacist or physician: None reported  Questions for the pharmacist: No    Confirmed patient received a Conservation officer, historic buildings and a Surveyor, mining with first shipment. The patient will receive a drug information handout for each medication shipped and additional FDA Medication Guides as required.       DISEASE/MEDICATION-SPECIFIC INFORMATION        For patients on injectable medications: Patient currently has 0 doses left.  Next injection is scheduled for 02/09/22.    SPECIALTY MEDICATION ADHERENCE     Medication Adherence    Patient reported X missed doses in the last month: 0  Specialty Medication: Repatha 140mg /ml  Patient is on additional specialty medications: No  Patient is on more than two specialty medications: No                                Were doses missed due to medication being on hold? No      REFERRAL TO PHARMACIST     Referral to the pharmacist: Not needed      [facility]     Shipping address confirmed in Epic.     Delivery Scheduled: Yes, Expected medication delivery date: 02/03/22.     Medication will be delivered via Same Day Courier to the prescription address in Epic WAM.    Quintella Reichert [facility] Specialty Technician

## 2022-02-03 MED FILL — REPATHA SURECLICK 140 MG/ML SUBCUTANEOUS PEN INJECTOR: SUBCUTANEOUS | 84 days supply | Qty: 6 | Fill #3

## 2022-02-05 ENCOUNTER — Ambulatory Visit: Admit: 2022-02-05 | Discharge: 2022-02-06 | Payer: MEDICARE | Attending: Adult Health | Primary: Adult Health

## 2022-02-05 MED ORDER — NICOTINE (POLACRILEX) 2 MG BUCCAL LOZENGE
BUCCAL | 2 refills | 5 days | Status: CP | PRN
Start: 2022-02-05 — End: 2022-03-07

## 2022-02-05 MED ORDER — NICOTINE 14 MG/24 HR DAILY TRANSDERMAL PATCH
MEDICATED_PATCH | TRANSDERMAL | 0 refills | 28 days | Status: CP
Start: 2022-02-05 — End: ?

## 2022-02-05 MED ORDER — FUROSEMIDE 20 MG TABLET
ORAL_TABLET | Freq: Two times a day (BID) | ORAL | 1 refills | 90 days | Status: CP
Start: 2022-02-05 — End: 2023-02-05

## 2022-02-05 MED ORDER — EVOLOCUMAB 140 MG/ML SUBCUTANEOUS PEN INJECTOR
SUBCUTANEOUS | 3 refills | 84 days | Status: CP
Start: 2022-02-05 — End: ?

## 2022-02-05 MED ORDER — OMEPRAZOLE 20 MG CAPSULE,DELAYED RELEASE
ORAL_CAPSULE | Freq: Every day | ORAL | 0 refills | 90 days | Status: CP
Start: 2022-02-05 — End: ?

## 2022-02-05 NOTE — Unmapped (Signed)
DIVISION OF CARDIOLOGY   [facility], [place]                                                                         Date of Service:  02/05/2022     Assessment/Plan     1. Coronary artery calcification  PET perfusion study showed coronary artery calcifications even though there is no ischemic burden. Strong FHx of CVD. No anginal symptoms   - continue ASA, statin, repatha  - restart regular exercise, wt loss    2. Hyperlipidemia, unspecified hyperlipidemia type  Prior intolerance to higher intensity statins.   On atorva 10 + repatha w/ excellent lipid control (LDL 162>>58). Tolerating well.     Lab Results   Component Value Date    LDL 58 08/06/2021     3. TIA  08/06/21 ED visit for likely TIA.   Continue ASA, statin  Smoking cessation    4. Tobacco abuse  Actively working on cessation. Had quit but back to smoking 1/2 a day.   - Start using nicotine (NICODERM CQ) 14 mg/24 hr patch  - Start nicotine polacrilex (NICORETTE MINI) lozenge 2 mg    Return to clinic:  Return in about 6 months (around 08/08/2022).    I personally spent 31 minutes face-to-face and non-face-to-face in the care of this patient, which includes all pre, intra, and post visit time on the date of service.     Return in about 6 months (around 08/08/2022).    Subjective:   Alyssa Ballard Russell, FNP  Chief complaint:  46 y.o. female with a history of coronary calcium, HLD, GERD, obesity, PTSD, depression and anxiety  is referred by Alyssa Lever, FNP for f/up    History of Present Illness:  Patient has been seen in our clinic starting in April 2022 for chest pain.  Echo and PET stress test were both unremarkable.  Coronary  calcifications were noted and she subsequently been started on aspirin, Repatha and low-dose atorvastatin (history of statin intolerance).  She is also had work-up of palpitations but Zio patch showed no significant arrhythmias, just 1 brief SVT.  I last saw her in February 2023 at which time she was overall doing well.  She had been seen in the ED for likely TIA.  Her cholesterol was reduced significantly since starting the Repatha.  She was also going to the gym regularly for exercise.    Today Alyssa Herrera presents for routine follow-up.  She denies any chest pain, shortness of breath, palpitations.  She reports that she had quit smoking but unfortunately relapsed in setting of extreme stressors in her life.  She now has custody of her 18-year-old granddaughter who is experienced significant trauma.  She has been eating more as a result of this and is also not able to exercise as much.    Currently smokes half pack/ day.     Has been having LE due to not having her Lasix medications.    She does admit to SOB, but due to anxiety, not because of exertion.     She went to a therapist, but do to stressors mentioned above, she was unable to continue to see them.    Mother  had heart attack at age of 51, with history of atrial fibrillation. Sister had a heart attack at 47, and history of strokes. Lives with 63 year old granddaughter. Has two children.     Past Medical History  Patient Active Problem List   Diagnosis    Anxiety    GERD (gastroesophageal reflux disease)    Obesity    Vitamin D deficiency    Morbid (severe) obesity due to excess calories (CMS-HCC)    Moderate persistent asthma without complication    Asthma    Arthralgia    Coronary artery calcification    Degeneration of intervertebral disc    Hyperlipidemia    Irritable bowel syndrome    Low back pain    Mitral valve disorder    Nicotine dependence, uncomplicated    Other seasonal allergic rhinitis    Other chronic pain    TIA (transient ischemic attack)    MDD (major depressive disorder), recurrent episode, moderate (CMS-HCC)    PTSD (post-traumatic stress disorder)       Medications:  Current Outpatient Medications   Medication Sig Dispense Refill    acetaminophen (TYLENOL ARTHRITIS ORAL) Take by mouth.      albuterol 2.5 mg /3 mL (0.083 %) nebulizer solution Inhale 3 mL (2.5 mg total) by nebulization four (4) times a day. 100 mL 3    albuterol HFA 90 mcg/actuation inhaler INHALE 2 PUFFS BY MOUTH EVERY 6 HOURS AS NEEDED FOR WHEEZE 18 g 5    aspirin (ECOTRIN) 81 MG tablet Take 1 tablet (81 mg total) by mouth daily.      atorvastatin (LIPITOR) 10 MG tablet Take 1 tablet (10 mg total) by mouth daily. 90 tablet 3    diclofenac (VOLTAREN) 50 MG EC tablet Take 1 tablet (50 mg total) by mouth Two (2) times a day. For arthritis pain. 60 tablet 1    DULoxetine (CYMBALTA) 30 MG capsule Take 1 capsule (30 mg total) by mouth daily. Week 2 can increase to 2 capsules po QD 60 capsule 0    empty container (SHARPS-A-GATOR DISPOSAL SYSTEM) Misc Use as directed for sharps disposal 1 each 2    ergocalciferol-1,250 mcg, 50,000 unit, (DRISDOL) 1,250 mcg (50,000 unit) capsule TAKE 1 CAPSULE BY MOUTH ONE TIME PER WEEK 12 capsule 2    fluticasone propionate (FLOVENT HFA) 110 mcg/actuation inhaler Inhale 2 puffs Two (2) times a day. As needed for cough 12 g 2    ipratropium-albuterol (DUO-NEB) 0.5-2.5 mg/3 mL nebulizer Inhale 3 mL every six (6) hours as needed.      LINZESS 145 mcg capsule TAKE 1 CAPSULE BY MOUTH EVERY DAY 90 capsule 1    methocarbamoL (ROBAXIN) 750 MG tablet Take 1 tablet (750 mg total) by mouth four (4) times a day. As needed for pain. 60 tablet 1    multivitamin (TAB-A-VITE/THERAGRAN) per tablet Take 1 tablet by mouth daily.      evolocumab 140 mg/mL PnIj Inject the contents of one pen (140 mg) under the skin every fourteen (14) days. 6 mL 3    furosemide (LASIX) 20 MG tablet Take 1 tablet (20 mg total) by mouth Two (2) times a day. 180 tablet 1    nicotine (NICODERM CQ) 14 mg/24 hr patch Place 1 patch on the skin daily. 28 patch 0    nicotine polacrilex (NICORETTE MINI) lozenge 2 mg Apply 1 lozenge (2 mg total) to cheek every hour as needed for smoking cessation. 108 each 2    olopatadine (PATADAY)  0.2 % ophthalmic solution Administer 1 drop to both eyes Two (2) times a day. (Patient not taking: Reported on 02/05/2022)      omeprazole (PRILOSEC) 20 MG capsule Take 1 capsule (20 mg total) by mouth daily. 90 capsule 0     No current facility-administered medications for this visit.       Allergies  Allergies   Allergen Reactions    Penicillins Hives and Shortness Of Breath     Can take amoxicillian    Tramadol Hives and Shortness Of Breath     Breathing difficulty   Blurred vision  Chest pain    Gabapentin Other (See Comments)     Seizure      Pravastatin Muscle Pain    Simvastatin Muscle Pain       Social History:   Social History     Tobacco Use    Smoking status: Every Day     Packs/day: 0.30     Years: 14.00     Additional pack years: 0.00     Total pack years: 4.20     Types: Cigarettes     Passive exposure: Never    Smokeless tobacco: Never    Tobacco comments:     Working on [employer] - 1PPD   Vaping Use    Vaping Use: Never used   Substance Use Topics    Alcohol use: No     Alcohol/week: 0.0 standard drinks of alcohol    Drug use: Yes     Types: Marijuana     Comment: 1-3x monthly        Family History:  Family History   Problem Relation Age of Onset    Colorectal Cancer Father     Arthritis Father     Diabetes Father     Drug abuse Father     Heart disease Father     Hypertension Father     Stroke Father     Clotting disorder Father     Ulcers Father     Colorectal Cancer Paternal Uncle     Ovarian cancer Mother     Arthritis Mother     Asthma Mother     Cancer Mother         Uteral    COPD Mother     Depression Mother     Diabetes Mother     Heart disease Mother     Hyperlipidemia Mother     Hypertension Mother     Kidney disease Mother     Mental illness Mother     Stroke Mother     Vision loss Mother         Due to massive heart attack    Clotting disorder Mother     Liver disease Mother     Ulcers Mother     Glaucoma Mother     Breast cancer Paternal Grandmother     Asthma Sister     Depression Sister     Diabetes Sister     Heart disease Sister     Hypertension Sister     Stroke Sister     Birth defects Sister         Prime    Clotting disorder Sister     Ulcers Sister     Cancer Maternal Grandmother         Breast    Early death Sister     Early death Brother     Learning disabilities Son  Learning disabilities Son     Aneurysm Paternal Grandfather        ROS- 12 system review is negative other than what is specified in the History of Present Illness.      Objective:   Physical Exam  Vitals:    02/05/22 1605   BP: 134/67   BP Position: Sitting   Pulse: 76   SpO2: 96%   Weight: (!) 134.5 kg (296 lb 9.6 oz)   Height: 165.1 cm (5' 5)      General-  Obese appearing female in no apparent distress.  Neurologic- Alert and oriented X3.  Cranial nerve II-XII grossly intact.  HEENT-  Normocephalic atraumatic head.  No scleral icterus.  MMM  Neck- Supple, no carotid bruis, no JVD  Lungs- Clear to auscultation, no wheezes, rhonchi, or rhales.  Heart-  RRR, no MRG.  Extremities-  No clubbing or cyanosis.  No pitting edema to LE bilaterally.  Pulses- 2+ pulses in radial and dorsalis pedis bilaterally.  Psych- Normal mood, appropriate.    Laboratory data:    I have personally reviewed the images of the following diagnostic studies.      Electrocardiogram:  From 09/28/20 showed SR, borderline low voltage.    Echocardiogram:  From 06/2015 at [facility] showed LV systolic function and size were normal. Estimated EF 60-65%. Wall motion was normal; there were no regional wall motion abnormalities. Left ventricular diastolic function parameters were normal. LA normal in size. RV systolic function was normal. Pulmonary artery systolic pressure was within the normal range.     From 09/21/20 showed LV upper normal in size with normal wall thickness, LVEF 55%, normal RV systolic function, no significant valvular abnormalities.    Nuclear stress test:  PET CT stress test showed 10/11/20 showed normal myocardial perfusion study. No evidence of any significant ischemia or scar. Left ventricular systolic function is normal. Post stress the ejection fraction is > 60%. Minimal coronary calcifications are noted. Status post cholecystectomy.    Cardiac monitor:  From 10/16/20 showed patient had a min HR of 48 bpm, max HR of 169 bpm, and avg HR of 80 bpm. Predominant underlying rhythm was Sinus Rhythm. 1 run of Supraventricular Tachycardia occurred lasting 4 beats with a max rate of 169 bpm (avg 150 bpm). Isolated SVEs were rare (<1.0%), SVE Couplets were rare (<1.0%), and SVE Triplets were rare (<1.0%). Isolated VEs were rare (<1.0%), VE Couplets were rare (<1.0%), and no VE Triplets were Present. There were 55 patient triggered events.  49 of the episodes corresponded to normal sinus rhythm.  6 episodes corresponded to isolated PVCs.    Documentation assistance was provided by Ruel Favors, Scribe on February 05, 2022 at 3:58 PM for Tiondra Fang J Octavian Godek, AGNP-C.    Glade Stanford, AGNP-C  Cardiology Nurse Practitioner  [facility]

## 2022-02-05 NOTE — Unmapped (Signed)
No med changes    Quit smoking

## 2022-02-05 NOTE — Unmapped (Signed)
Pt in for routine follow up. Pt  needs refills on  Furosemide, omeprazole.

## 2022-02-13 ENCOUNTER — Ambulatory Visit: Admit: 2022-02-13 | Discharge: 2022-02-13 | Payer: MEDICARE

## 2022-02-13 DIAGNOSIS — F419 Anxiety disorder, unspecified: Principal | ICD-10-CM

## 2022-02-13 DIAGNOSIS — F17210 Nicotine dependence, cigarettes, uncomplicated: Principal | ICD-10-CM

## 2022-02-13 DIAGNOSIS — F331 Major depressive disorder, recurrent, moderate: Principal | ICD-10-CM

## 2022-02-13 MED ORDER — TOPIRAMATE 25 MG TABLET
ORAL_TABLET | Freq: Every evening | ORAL | 0 refills | 60 days | Status: CP
Start: 2022-02-13 — End: 2023-02-13

## 2022-02-13 NOTE — Unmapped (Unsigned)
HPI: Alyssa Herrera is a 46 y.o female who with PMH significant for obesity, anxiety, depression, irritable bowel syndrome, nicotine dependence, and hyperlipidemia. She has previously reported chronic low back pain with discogenic component, grade 1 listhesis L4-5, lower extremity pain. Today she  presents to the clinic today for a follow up. a     Past Medical History:   Diagnosis Date    ADD (attention deficit disorder)     Aneurysm (CMS-HCC)     cerebral    Anxiety     Asthma     At risk for falls     off balance, arthritis (hips, knees, feet) - last fall 02/2020    Borderline diabetes     Caregiver burden     5 yo granddaughter     Cerebrovascular disease     Constipation     COPD (chronic obstructive pulmonary disease) (CMS-HCC) 08/05/2010    DDD (degenerative disc disease), cervical     Depression     Financial difficulties     house repairs, dental work    GERD (gastroesophageal reflux disease)     Hiatal hernia     Hyperlipidemia     Hypertension     IBS (irritable bowel syndrome)     Impaired mobility     off balance, arthritis (hips, knees, feet)    Irregular heartbeat     Moderate asthma     MVP (mitral valve prolapse)     Obesity     OCD (obsessive compulsive disorder)     Pancreatitis     Peptic ulceration     Seizures (CMS-HCC)     last episode last week, focal with right facial droop/right arm weakness    Stroke (CMS-HCC) 2016    right arm weakness when she gets tired    Visual impairment     contacts    Vitamin D deficiency        Past Surgical History:   Procedure Laterality Date    CARDIAC CATHETERIZATION  2009    2015    CESAREAN SECTION  06/22/1996/09/07/98    Birth of 3 sons    CHOLECYSTECTOMY      COLONOSCOPY      HYSTERECTOMY      OOPHORECTOMY      PR COLONOSCOPY W/BIOPSY SINGLE/MULTIPLE N/A 11/13/2016    Procedure: COLONOSCOPY, FLEXIBLE, PROXIMAL TO SPLENIC FLEXURE; WITH BIOPSY, SINGLE OR MULTIPLE;  Surgeon: Toney Reil, MD;  [facility]: [facility];  Service: Gastroenterology    PR COLONOSCOPY W/BIOPSY SINGLE/MULTIPLE N/A 12/03/2018    Procedure: ESOPHAGOGASTRODUODENOSCOPY WITH BIOPSY;  Surgeon: Laretta Bolster, MD;  Location: ENDO PROCEDURES [place];  Service: Gastroenterology    TONSILLECTOMY      TUBAL LIGATION           Current Outpatient Medications:     acetaminophen (TYLENOL ARTHRITIS ORAL), Take by mouth., Disp: , Rfl:     albuterol 2.5 mg /3 mL (0.083 %) nebulizer solution, Inhale 3 mL (2.5 mg total) by nebulization four (4) times a day., Disp: 100 mL, Rfl: 3    albuterol HFA 90 mcg/actuation inhaler, INHALE 2 PUFFS BY MOUTH EVERY 6 HOURS AS NEEDED FOR WHEEZE, Disp: 18 g, Rfl: 5    aspirin (ECOTRIN) 81 MG tablet, Take 1 tablet (81 mg total) by mouth daily., Disp: , Rfl:     atorvastatin (LIPITOR) 10 MG tablet, Take 1 tablet (10 mg total) by mouth daily., Disp: 90 tablet, Rfl: 3    diclofenac (VOLTAREN)  50 MG EC tablet, Take 1 tablet (50 mg total) by mouth Two (2) times a day. For arthritis pain., Disp: 60 tablet, Rfl: 1    DULoxetine (CYMBALTA) 30 MG capsule, Take 1 capsule (30 mg total) by mouth daily. Week 2 can increase to 2 capsules po QD, Disp: 60 capsule, Rfl: 0    empty container (SHARPS-A-GATOR DISPOSAL SYSTEM) Misc, Use as directed for sharps disposal, Disp: 1 each, Rfl: 2    ergocalciferol-1,250 mcg, 50,000 unit, (DRISDOL) 1,250 mcg (50,000 unit) capsule, TAKE 1 CAPSULE BY MOUTH ONE TIME PER WEEK, Disp: 12 capsule, Rfl: 2    evolocumab 140 mg/mL PnIj, Inject the contents of one pen (140 mg) under the skin every fourteen (14) days., Disp: 6 mL, Rfl: 3    fluticasone propionate (FLOVENT HFA) 110 mcg/actuation inhaler, Inhale 2 puffs Two (2) times a day. As needed for cough, Disp: 12 g, Rfl: 2    furosemide (LASIX) 20 MG tablet, Take 1 tablet (20 mg total) by mouth Two (2) times a day., Disp: 180 tablet, Rfl: 1    ipratropium-albuterol (DUO-NEB) 0.5-2.5 mg/3 mL nebulizer, Inhale 3 mL every six (6) hours as needed., Disp: , Rfl:     LINZESS 145 mcg capsule, TAKE 1 CAPSULE BY MOUTH EVERY DAY, Disp: 90 capsule, Rfl: 1    methocarbamoL (ROBAXIN) 750 MG tablet, Take 1 tablet (750 mg total) by mouth four (4) times a day. As needed for pain., Disp: 60 tablet, Rfl: 1    multivitamin (TAB-A-VITE/THERAGRAN) per tablet, Take 1 tablet by mouth daily., Disp: , Rfl:     nicotine (NICODERM CQ) 14 mg/24 hr patch, Place 1 patch on the skin daily., Disp: 28 patch, Rfl: 0    nicotine polacrilex (NICORETTE MINI) lozenge 2 mg, Apply 1 lozenge (2 mg total) to cheek every hour as needed for smoking cessation., Disp: 108 each, Rfl: 2    olopatadine (PATADAY) 0.2 % ophthalmic solution, Administer 1 drop to both eyes Two (2) times a day. (Patient not taking: Reported on 02/05/2022), Disp: , Rfl:     omeprazole (PRILOSEC) 20 MG capsule, Take 1 capsule (20 mg total) by mouth daily., Disp: 90 capsule, Rfl: 0    Allergies   Allergen Reactions    Penicillins Hives and Shortness Of Breath     Can take amoxicillian    Tramadol Hives and Shortness Of Breath     Breathing difficulty   Blurred vision  Chest pain    Gabapentin Other (See Comments)     Seizure      Pravastatin Muscle Pain    Simvastatin Muscle Pain        Social History     Socioeconomic History    Marital status: Single    Number of children: 2   Tobacco Use    Smoking status: Every Day     Packs/day: 0.30     Years: 14.00     Additional pack years: 0.00     Total pack years: 4.20     Types: Cigarettes     Passive exposure: Never    Smokeless tobacco: Never    Tobacco comments:     Working on [employer] - 1PPD   Vaping Use    Vaping Use: Never used   Substance and Sexual Activity    Alcohol use: No     Alcohol/week: 0.0 standard drinks of alcohol    Drug use: Yes     Types: Marijuana     Comment: 1-3x monthly  Sexual activity: Yes     Partners: Male     Social Determinants of Health     Financial Resource Strain: Medium Risk (09/30/2021)    Overall Financial Resource Strain (CARDIA)     Difficulty of Paying Living Expenses: Somewhat hard Food Insecurity: No Food Insecurity (09/30/2021)    Hunger Vital Sign     Worried About Running Out of Food in the Last Year: Never true     Ran Out of Food in the Last Year: Never true   Transportation Needs: Unmet Transportation Needs (09/30/2021)    PRAPARE - Therapist, art (Medical): No     Lack of Transportation (Non-Medical): Yes        Family History   Problem Relation Age of Onset    Colorectal Cancer Father     Arthritis Father     Diabetes Father     Drug abuse Father     Heart disease Father     Hypertension Father     Stroke Father     Clotting disorder Father     Ulcers Father     Colorectal Cancer Paternal Uncle     Ovarian cancer Mother     Arthritis Mother     Asthma Mother     Cancer Mother         Uteral    COPD Mother     Depression Mother     Diabetes Mother     Heart disease Mother     Hyperlipidemia Mother     Hypertension Mother     Kidney disease Mother     Mental illness Mother     Stroke Mother     Vision loss Mother         Due to massive heart attack    Clotting disorder Mother     Liver disease Mother     Ulcers Mother     Glaucoma Mother     Breast cancer Paternal Grandmother     Asthma Sister     Depression Sister     Diabetes Sister     Heart disease Sister     Hypertension Sister     Stroke Sister     Birth defects Sister         Prime    Clotting disorder Sister     Ulcers Sister     Cancer Maternal Grandmother         Breast    Early death Sister     Early death Brother     Learning disabilities Son     Learning disabilities Son     Aneurysm Paternal Grandfather         ROS - as stated above in HPI & otherwise negative    EXAM:    LMP 03/01/2002       5/5 Motor strength in all major muscle groups of the BUE / BLE    Sensation: Grossly intact through all dermatomal distributions    Reflexes: 2+ throughout    PLAN:

## 2022-02-13 NOTE — Unmapped (Signed)
Assessment and Plan:     Alyssa Herrera was seen today for follow-up.    Diagnoses and all orders for this visit:    Cigarette nicotine dependence without complication    -Pt determined to cut back/quit, has not bought pack of cigarettes > 1 week  -Using gum with cravings    Morbid (severe) obesity due to excess calories (CMS-HCC)    - Patient centered discussion around weight loss medications  - Ideally, pt would like GLP-1, given hx of pancreatitis-will message weight loss provider regarding safety of this class of medications  -Aware if GLP-1 safe, would likely need PA & subjective to availability in local pharmacies  -Reviewed: Metformin, topiramate, bupropion/naltrexone, phentermine.  -After patient center discussion on risks, benefits, indications--patient agreeable to start topiramate  -Aware needs to use in conjunction with lifestyle modifications.  -Patient would like to avoid bariatric surgery if possible  -RTC 1 month    MDD (major depressive disorder), recurrent episode, moderate (CMS-HCC)  Anxiety    -Currently, lots of life stressors, patient coping best as she can.  -Offered referral back to virtual LCSW for therapy, patient declined at this time.  -Stopped Cymbalta    Hx of TIA, high cholesterol, CAD: seen by cardiology 08/23, taking 10 mg atorvastatin, repatha, 81 mg ASA  Hx of GI problems- was referred to [facility], not gotten call,--plans to follow-up- taking PPI and Linzess  Hx of sciatica, chronic back pain: has specialist appointment today  Flu shot, COVID booster in fall    Barriers to recommended plan: None identified    Return if symptoms worsen or fail to improve, for As scheduled.      Subjective:     HPI: Alyssa Herrera is a 46 y.o. female here for Follow-up (Weight management ).    Follow-up:    Tobacco use:  Determined to quit tobacco, has not bought pack of cigarettes > 1 week  When cravings for tobacco occur, chews gum    Situational anxiety:  Step dad w/stroke, has custody of 27 year old grand-daughter  Higher stress as a results--stress eating sweets/chips  Attempted therapy via virtual LCSW, has not reconnected with therapist     Obesity:  Stress eating, but trying to make changes  Changing out smoothies for sweets, more b12 to increase energy, as feeling tired all the time  Walks in evenings, likes going to gym--hard time getting there  Hx on pancreatitis 8 years ago, hx H pylori, uncertain reason for pancreatitis  Ideally, would like GLP-1, open to other medications, has had success on phentermine   Has had consult in past with weight loss surgery clinic, would like to avoid surgery, but aware this is possibility.      Hx of TIA, high cholesterol, CAD: seen by cardiology 08/23, taking 10 mg atorvastatin, repatha, 81 mg ASA  Hx of GI problems- was referred to [facility], not gotten call, taking PPI and Linzess  Hx of sciatica, chronic back pain: has specialist appointment today    Discussed COVID booster, flu shot in fall          I have reviewed past medical, surgical, medications, allergies, social and family histories today and updated them in Epic where appropriate.    ROS:   PHQ-9 PHQ-9 TOTAL SCORE   02/13/2022  10:00 AM 11   11/18/2021   3:00 PM 15   09/30/2021   4:00 PM 19   11/13/2020   8:00 AM 4  08/13/2020  10:00 AM 19      GAD7 Total Score GAD-7 Total Score   02/13/2022  10:00 AM 11   11/18/2021   3:00 PM 16   09/30/2021   4:00 PM 20   08/13/2020  10:00 AM 17   08/25/2019   1:00 PM 21     Wt Readings from Last 6 Encounters:   02/13/22 (!) 134.4 kg (296 lb 6.4 oz)   02/05/22 (!) 134.5 kg (296 lb 9.6 oz)   11/18/21 (!) 131.1 kg (289 lb)   08/15/21 (!) 125.2 kg (276 lb)   08/08/21 (!) 125.9 kg (277 lb 8 oz)   06/24/21 (!) 127.2 kg (280 lb 6.4 oz)      Review of systems negative unless otherwise noted as per HPI.      Objective:     Vitals:    02/13/22 1011   BP: 132/78   Pulse: 89   Temp: 36.9 ??C (98.4 ??F)   SpO2: 97%     Body mass index is 49.32 kg/m??.    Physical Exam  Vitals and nursing note reviewed.   Constitutional:       Appearance: Normal appearance.   HENT:      Head: Normocephalic and atraumatic.   Musculoskeletal:         General: Normal range of motion.   Skin:     General: Skin is warm and dry.      Capillary Refill: Capillary refill takes less than 2 seconds.   Neurological:      General: No focal deficit present.      Mental Status: She is alert and oriented to person, place, and time. Mental status is at baseline.   Psychiatric:         Mood and Affect: Mood normal.         Behavior: Behavior normal.         Thought Content: Thought content normal.         Judgment: Judgment normal.            Medication adherence and barriers to the treatment plan have been addressed. Opportunities to optimize healthy behaviors have been discussed. Patient / caregiver voiced understanding.   I personally spent 30 minutes face-to-face and non-face-to-face in the care of this patient, which includes all pre, intra, and post visit time on the date of service.  [name], DNP, FNP-C  [facility]  [phone] (F)    Note - This record has been created using [employer]. Chart creation errors have been sought, but may not always have been located. Such creation errors do not reflect on the standard of medical care.

## 2022-03-11 MED ORDER — TOPIRAMATE 25 MG TABLET
ORAL_TABLET | Freq: Every evening | ORAL | 1 refills | 90 days | Status: CP
Start: 2022-03-11 — End: 2023-03-11

## 2022-03-11 NOTE — Unmapped (Signed)
Patient is requesting the following refill  Requested Prescriptions     Pending Prescriptions Disp Refills    topiramate (TOPAMAX) 25 MG tablet [Pharmacy Med Name: TOPIRAMATE 25 MG TABLET] 180 tablet 1     Sig: Take 2 tablets (50 mg total) by mouth nightly.       Recent Visits  Date Type Provider Dept   02/13/22 Office Visit [name], FNP [facility] [address] [facility]   11/18/21 Office Visit Johnn Hai, Loleta Rose, FNP [place] [facility] [address] At [facility]   08/15/21 Office Visit [name], FNP [place] [facility]   05/15/21 Office Visit [name], FNP [place] [facility]   Showing recent visits within past 365 days with a meds authorizing provider and meeting all other requirements  Future Appointments  Date Type Provider Dept   03/14/22 Appointment [name], FNP [place] [facility] [address] At [facility]   Showing future appointments within next 365 days with a meds authorizing provider and meeting all other requirements       Labs: Not applicable this refill

## 2022-03-12 ENCOUNTER — Other Ambulatory Visit: Payer: Self-pay

## 2022-03-12 ENCOUNTER — Encounter: Payer: Self-pay | Admitting: Emergency Medicine

## 2022-03-12 ENCOUNTER — Emergency Department: Payer: Medicare Other

## 2022-03-12 ENCOUNTER — Emergency Department
Admission: EM | Admit: 2022-03-12 | Discharge: 2022-03-12 | Disposition: A | Discharge status: Home / Self Care | Payer: Medicare Other | Attending: Student in an Organized Health Care Education/Training Program | Admitting: Student in an Organized Health Care Education/Training Program

## 2022-03-12 DIAGNOSIS — Z87891 Personal history of nicotine dependence: Secondary | ICD-10-CM | POA: Insufficient documentation

## 2022-03-12 DIAGNOSIS — R079 Chest pain, unspecified: Secondary | ICD-10-CM | POA: Diagnosis present

## 2022-03-12 DIAGNOSIS — D72829 Elevated white blood cell count, unspecified: Secondary | ICD-10-CM | POA: Diagnosis not present

## 2022-03-12 DIAGNOSIS — Z20822 Contact with and (suspected) exposure to covid-19: Secondary | ICD-10-CM | POA: Insufficient documentation

## 2022-03-12 DIAGNOSIS — J209 Acute bronchitis, unspecified: Secondary | ICD-10-CM | POA: Insufficient documentation

## 2022-03-12 LAB — TROPONIN I (HIGH SENSITIVITY): Troponin I (High Sensitivity): 5 ng/L (ref <18)

## 2022-03-12 LAB — CBC
HCT: 42.6 % (ref 36.0–46.0)
Hemoglobin: 14 g/dL (ref 12.0–15.0)
MCH: 31 pg (ref 26.0–34.0)
MCHC: 32.9 g/dL (ref 30.0–36.0)
MCV: 94.2 fL (ref 80.0–100.0)
Platelets: 330 10*3/uL (ref 150–400)
RBC: 4.52 MIL/uL (ref 3.87–5.11)
RDW: 12.3 % (ref 11.5–15.5)
WBC: 10.7 10*3/uL — ABNORMAL HIGH (ref 4.0–10.5)
nRBC: 0 % (ref 0.0–0.2)

## 2022-03-12 LAB — BASIC METABOLIC PANEL
Anion gap: 10 (ref 5–15)
BUN: 14 mg/dL (ref 6–20)
CO2: 27 mmol/L (ref 22–32)
Calcium: 9.3 mg/dL (ref 8.9–10.3)
Chloride: 105 mmol/L (ref 98–111)
Creatinine, Ser: 0.9 mg/dL (ref 0.44–1.00)
GFR, Estimated: >60 mL/min (ref >60)
Glucose, Bld: 90 mg/dL (ref 70–99)
Potassium: 3.5 mmol/L (ref 3.5–5.1)
Sodium: 142 mmol/L (ref 135–145)

## 2022-03-12 LAB — RESP PANEL BY RT-PCR (FLU A&B, COVID) ARPGX2
Influenza A by PCR: NEGATIVE
Influenza B by PCR: NEGATIVE
SARS Coronavirus 2 by RT PCR: NEGATIVE

## 2022-03-12 MED ORDER — PREDNISONE 20 MG PO TABS: 40.0000 mg | ORAL_TABLET | Freq: Every day | ORAL | 0 refills | Status: AC

## 2022-03-12 MED ORDER — PREDNISONE 20 MG PO TABS
60.0000 mg | ORAL_TABLET | Freq: Once | ORAL | Status: AC
  Administered 2022-03-12: 60 mg via ORAL
  Filled 2022-03-12: qty 3

## 2022-03-12 MED ORDER — IPRATROPIUM-ALBUTEROL 0.5-2.5 (3) MG/3ML IN SOLN
3.0000 mL | Freq: Once | RESPIRATORY_TRACT | Status: AC
Start: 2022-03-12 — End: 2022-03-12
  Administered 2022-03-12: 3 mL via RESPIRATORY_TRACT
  Filled 2022-03-12: qty 3

## 2022-03-12 MED ORDER — AZITHROMYCIN 250 MG PO TABS: ORAL_TABLET | ORAL | 0 refills | Status: DC

## 2022-03-12 MED ORDER — IPRATROPIUM-ALBUTEROL 0.5-2.5 (3) MG/3ML IN SOLN
3.0000 mL | Freq: Four times a day (QID) | RESPIRATORY_TRACT | 1 refills | Status: AC | PRN
Start: 2022-03-12 — End: ?

## 2022-03-12 MED ORDER — KETOROLAC TROMETHAMINE 30 MG/ML IJ SOLN
30.0000 mg | Freq: Once | INTRAMUSCULAR | Status: AC
  Administered 2022-03-12: 30 mg via INTRAMUSCULAR
  Filled 2022-03-12: qty 1

## 2022-03-12 MED ORDER — OXYCODONE-ACETAMINOPHEN 5-325 MG PO TABS
1.0000 | ORAL_TABLET | Freq: Once | ORAL | Status: AC
  Administered 2022-03-12: 1 via ORAL
  Filled 2022-03-12: qty 1

## 2022-03-12 MED ORDER — AZITHROMYCIN 500 MG PO TABS
500.0000 mg | ORAL_TABLET | Freq: Once | ORAL | Status: AC
  Administered 2022-03-12: 500 mg via ORAL
  Filled 2022-03-12: qty 1

## 2022-03-12 NOTE — ED Triage Notes (Signed)
Pt via POV from home. Pt c/o central to L sided chest pain that radiates to the L side of her back states she also has had a dry cough, feeling hot, and SOB. States that she think she is recovering from a sinus infection. Denies cardiac hx. Pt is A&Ox4 and NAD

## 2022-03-12 NOTE — ED Provider Notes (Signed)
[facility] Provider Note    Event Date/Time   First MD Initiated Contact with Patient 03/12/22 1954     (approximate)   History   Chest Pain   HPI  Kendra Clark is a 46 y.o. female smoking history history of bronchitis presents to the ER for evaluation of several days of chest pain.  States she recently got over a sinus infection felt like it went down She is having wheezing that is worsening started developing nonproductive cough yesterday.  States that this feels consistent with her previous episodes of bronchitis.  Has not noted any lower extremity swelling.  Has taken some nebulizers with some improvement but feels like she needs prednisone.  Denies any diaphoresis.  No pain ripping or tearing through to her back.     Physical Exam   Triage Vital Signs: ED Triage Vitals  Enc Vitals Group     BP 03/12/22 1721 (!) 142/76     Pulse Rate 03/12/22 1721 63     Resp 03/12/22 1721 18     Temp 03/12/22 1721 98 F (36.7 C)     Temp src --      SpO2 03/12/22 1721 99 %     Weight 03/12/22 1719 287 lb (130.2 kg)     Height 03/12/22 1719 5\' 5"  (1.651 m)     Head Circumference --      Peak Flow --      Pain Score 03/12/22 1719 8     Pain Loc --      Pain Edu? --      Excl. in [place]? --     Most recent vital signs: Vitals:   03/12/22 1721  BP: (!) 142/76  Pulse: 63  Resp: 18  Temp: 98 F (36.7 C)  SpO2: 99%     Constitutional: Alert  Eyes: Conjunctivae are normal.  Head: Atraumatic. Nose: No congestion/rhinnorhea. Mouth/Throat: Mucous membranes are moist.   Neck: Painless ROM.  Cardiovascular:   Good peripheral circulation. Respiratory: No significant tachypnea.  Musical wheezes appreciated throughout. Gastrointestinal: Soft and nontender.  Musculoskeletal:  no deformity Neurologic:  MAE spontaneously. No gross focal neurologic deficits are appreciated.  Skin:  Skin is warm, dry and intact. No rash noted. Psychiatric: Mood and affect  are normal. Speech and behavior are normal.    ED Results / Procedures / Treatments   Labs (all labs ordered are listed, but only abnormal results are displayed) Labs Reviewed  CBC - Abnormal; Notable for the following components:      Result Value   WBC 10.7 (*)    All other components within normal limits  RESP PANEL BY RT-PCR (FLU A&B, COVID) ARPGX2  BASIC METABOLIC PANEL  TROPONIN I (HIGH SENSITIVITY)     EKG  ED ECG REPORT I, Merlyn Lot, the attending physician, personally viewed and interpreted this ECG.   Date: 03/12/2022  EKG Time: 17:16 Rate: 65  Rhythm: sinus  Axis:right  Intervals: normal  ST&T Change: no stemi, no depressions, nonspecific st abn, no significant changes as compared to previous    RADIOLOGY Please see ED Course for my review and interpretation.  I personally reviewed all radiographic images ordered to evaluate for the above acute complaints and reviewed radiology reports and findings.  These findings were personally discussed with the patient.  Please see medical record for radiology report.    PROCEDURES:  Critical Care performed: No  Procedures   MEDICATIONS ORDERED IN ED: Medications  ketorolac (TORADOL)  30 MG/ML injection 30 mg (has no administration in time range)  ipratropium-albuterol (DUONEB) 0.5-2.5 (3) MG/3ML nebulizer solution 3 mL (3 mLs Nebulization Given 03/12/22 2043)  predniSONE (DELTASONE) tablet 60 mg (60 mg Oral Given 03/12/22 2042)  azithromycin (ZITHROMAX) tablet 500 mg (500 mg Oral Given 03/12/22 2042)  oxyCODONE-acetaminophen (PERCOCET/ROXICET) 5-325 MG per tablet 1 tablet (1 tablet Oral Given 03/12/22 2043)     IMPRESSION / MDM / ASSESSMENT AND PLAN / ED COURSE  I reviewed the triage vital signs and the nursing notes.                              Differential diagnosis includes, but is not limited to, bronchitis, pneumonia, pleurisy, ACS, dissection, PE,   Patient presented to the ER for evaluation of  symptoms as described above.  This presenting complaint could reflect a potentially life-threatening illness therefore the patient will be placed on continuous pulse oximetry and telemetry for monitoring.  Laboratory evaluation will be sent to evaluate for the above complaints.  Presentation appears more consistent with bronchitis.  COVID test negative.  Chest x-ray on my review and interpretation does not show any evidence of consolidation or pneumothorax.  She has mild leukocytosis does describe a productive cough.  She is low risk by Wells criteria PERC negative.  Troponin negative.  Will give symptomatic treatment observe in the ER and reassess.      Clinical Course as of 03/12/22 2134  Wed Mar 12, 2022  2133 Patient reassessed.  Feels significantly improved.  Ambulates without any hypoxia.  This point does appear stable and appropriate for outpatient follow-up.  Patient agreeable to plan. [PR]    Clinical Course User Index [PR] Merlyn Lot, MD     FINAL CLINICAL IMPRESSION(S) / ED DIAGNOSES   Final diagnoses:  Acute bronchitis, unspecified organism     Rx / DC Orders   ED Discharge Orders          Ordered    predniSONE (DELTASONE) 20 MG tablet  Daily        03/12/22 2133    ipratropium-albuterol (DUONEB) 0.5-2.5 (3) MG/3ML SOLN  Every 6 hours PRN        03/12/22 2133    azithromycin (ZITHROMAX) 250 MG tablet        03/12/22 2133             Note:  This document was prepared using Dragon voice recognition software and may include unintentional dictation errors.    Merlyn Lot, MD 03/12/22 2135

## 2022-03-12 NOTE — ED Provider Triage Note (Signed)
Emergency Medicine Provider Triage Evaluation Note  Kendra Clark , a 46 y.o. female  was evaluated in triage.  Pt complains of left sided chest pain, cough, no fever or chills.  Sx x 2 days.  Review of Systems  Positive: cp Negative: fever  Physical Exam  BP (!) 142/76   Pulse 63   Temp 98 F (36.7 C)   Resp 18   Ht 5\' 5"  (1.651 m)   Wt 130.2 kg   SpO2 99%   BMI 47.76 kg/m  Gen:   Awake, no distress   Resp:  Normal effort  MSK:   Moves extremities without difficulty  Other:    Medical Decision Making  Medically screening exam initiated at 5:28 PM.  Appropriate orders placed.  Kendra Clark was informed that the remainder of the evaluation will be completed by another provider, this initial triage assessment does not replace that evaluation, and the importance of remaining in the ED until their evaluation is complete.     Versie Starks, PA-C 03/12/22 1729

## 2022-03-14 NOTE — Unmapped (Signed)
Called patient to inform [name] would be able to do her routine follow up but is unable to fill Disability paper work.    Patient requested to have appt cancelled and scheduled for a different day.

## 2022-03-17 ENCOUNTER — Ambulatory Visit: Admit: 2022-03-17 | Discharge: 2022-03-18 | Payer: MEDICARE

## 2022-03-17 DIAGNOSIS — J45909 Unspecified asthma, uncomplicated: Principal | ICD-10-CM

## 2022-03-17 MED ORDER — IBUPROFEN 400 MG TABLET
ORAL_TABLET | Freq: Four times a day (QID) | ORAL | 0 refills | 8 days | Status: CP | PRN
Start: 2022-03-17 — End: ?

## 2022-03-17 MED ORDER — DOXYCYCLINE HYCLATE 100 MG CAPSULE
ORAL_CAPSULE | Freq: Two times a day (BID) | ORAL | 0 refills | 7 days | Status: CP
Start: 2022-03-17 — End: 2022-03-24

## 2022-03-17 MED ORDER — FLUCONAZOLE 150 MG TABLET
ORAL_TABLET | Freq: Once | ORAL | 0 refills | 1 days | Status: CP
Start: 2022-03-17 — End: 2022-03-17

## 2022-03-17 MED ORDER — METHYLPREDNISOLONE 4 MG TABLETS IN A DOSE PACK
0 refills | 0 days | Status: CP
Start: 2022-03-17 — End: ?

## 2022-03-17 MED ORDER — BENZONATATE 200 MG CAPSULE
ORAL_CAPSULE | Freq: Three times a day (TID) | ORAL | 0 refills | 10.00000 days | Status: CP | PRN
Start: 2022-03-17 — End: 2022-03-24

## 2022-03-17 NOTE — Unmapped (Signed)
Assessment/Plan:    Sequoya was seen today for pain.    Diagnoses and all orders for this visit:    Acute asthmatic bronchitis  -     doxycycline (VIBRAMYCIN) 100 MG capsule; Take 1 capsule (100 mg total) by mouth Two (2) times a day for 7 days.  -     benzonatate (TESSALON) 200 MG capsule; Take 1 capsule (200 mg total) by mouth Three (3) times a day as needed for cough for up to 7 days.  -     methylPREDNISolone (MEDROL DOSEPACK) 4 mg tablet; follow package directions  -     fluconazole (DIFLUCAN) 150 MG tablet; Take 1 tablet (150 mg total) by mouth once for 1 dose.  -     ibuprofen (MOTRIN) 400 MG tablet; Take 1 tablet (400 mg total) by mouth every six (6) hours as needed for pain.        The pt is seen today for follow up of acute asthmatic bronchitis. Her symptoms included non productive cough and wheezing.  She was seen on 03/12/2022 at [facility] ED and prescribed Z pack and po steroids (40 mg daily for 5 days). She has a history of asthma and she was to continue with Flovent and albuterol inhaler/Duoneb as directed.  She has pain between her shoulder blades causing her to catch her breath worse when she is coughing.. She has screening EKG at ED revealed no significant changes when compared to prior EKG. CXR was negative. Acute respiratory panel was negative and CBC, BMP and HS troponin levels were normal.  She is Covid 19 vaccinated and boosted 05/2020.  She has has several recent home negative Covid 19 tests.  She just completed Z pack and prednisone  and she is still wheezing and coughing.  She is having a tempt of 99.     Although her lung exam and HEENT exam are normal today, given her PMH and current symptoms I recommend starting Doxycycline, Medrol dose pack, Tessalon pearls and IB as directed.     Return if symptoms worsen or fail to improve.    Subjective:     HPI    The pt is seen today for follow up of acute asthmatic bronchitis. Her symptoms included non productive cough and wheezing.  She was seen on 03/12/2022 at [facility] ED and prescribed Z pack and po steroids (40 mg daily for 5 days). She has a history of asthma and she was to continue with Flovent and albuterol inhaler/Duoneb as directed.  She has pain between her shoulder blades causing her to catch her breath worse when she is coughing.. She has screening EKG at ED revealed no significant changes when compared to prior EKG. CXR was negative. Acute respiratory panel was negative and CBC, BMP and HS troponin levels were normal.  She is Covid 19 vaccinated and boosted 05/2020.  She has has several recent home negative Covid 19 tests.  She just completed Z pack and prednisone  and she is still wheezing and coughing.  She is having a tempt of 99.           ROS  Constitutional:  Denies  unexpected weight loss or gain, or weakness   Eyes:  Denies visual changes  Respiratory:  history as noted above  Cardiovascular:  Denies chest pain, palpitations or lower extremity swelling   GI:  Denies abdominal pain, diarrhea, constipation   Musculoskeletal:  Denies myalgias  Skin:  Denies nonhealing lesions  Neurologic:  Denies  headache, focal weakness or numbness, tingling  Endocrine:  Denies polyuria or polydypsia   Psychiatric:  Denies depression, anxiety      Outpatient Medications Prior to Visit   Medication Sig Dispense Refill    acetaminophen (TYLENOL ARTHRITIS ORAL) Take by mouth.      albuterol 2.5 mg /3 mL (0.083 %) nebulizer solution Inhale 3 mL (2.5 mg total) by nebulization four (4) times a day. 100 mL 3    albuterol HFA 90 mcg/actuation inhaler INHALE 2 PUFFS BY MOUTH EVERY 6 HOURS AS NEEDED FOR WHEEZE 18 g 5    aspirin (ECOTRIN) 81 MG tablet Take 1 tablet (81 mg total) by mouth daily.      atorvastatin (LIPITOR) 10 MG tablet Take 1 tablet (10 mg total) by mouth daily. 90 tablet 3    diclofenac (VOLTAREN) 50 MG EC tablet Take 1 tablet (50 mg total) by mouth Two (2) times a day. For arthritis pain. 60 tablet 1    empty container (SHARPS-A-GATOR DISPOSAL SYSTEM) Misc Use as directed for sharps disposal 1 each 2    ergocalciferol-1,250 mcg, 50,000 unit, (DRISDOL) 1,250 mcg (50,000 unit) capsule TAKE 1 CAPSULE BY MOUTH ONE TIME PER WEEK 12 capsule 2    evolocumab 140 mg/mL PnIj Inject the contents of one pen (140 mg) under the skin every fourteen (14) days. 6 mL 3    fluticasone propionate (FLOVENT HFA) 110 mcg/actuation inhaler Inhale 2 puffs Two (2) times a day. As needed for cough 12 g 2    furosemide (LASIX) 20 MG tablet Take 1 tablet (20 mg total) by mouth Two (2) times a day. 180 tablet 1    LINZESS 145 mcg capsule TAKE 1 CAPSULE BY MOUTH EVERY DAY 90 capsule 1    methocarbamoL (ROBAXIN) 750 MG tablet Take 1 tablet (750 mg total) by mouth four (4) times a day. As needed for pain. 60 tablet 1    multivitamin (TAB-A-VITE/THERAGRAN) per tablet Take 1 tablet by mouth daily.      nicotine (NICODERM CQ) 14 mg/24 hr patch Place 1 patch on the skin daily. 28 patch 0    omeprazole (PRILOSEC) 20 MG capsule Take 1 capsule (20 mg total) by mouth daily. 90 capsule 0    topiramate (TOPAMAX) 25 MG tablet Take 2 tablets (50 mg total) by mouth nightly. 180 tablet 1    DULoxetine (CYMBALTA) 30 MG capsule Take 1 capsule (30 mg total) by mouth daily. Week 2 can increase to 2 capsules po QD (Patient not taking: Reported on 02/13/2022) 60 capsule 0    ipratropium-albuterol (DUO-NEB) 0.5-2.5 mg/3 mL nebulizer Inhale 3 mL every six (6) hours as needed. (Patient not taking: Reported on 03/17/2022)       No facility-administered medications prior to visit.         Objective:       Vital Signs  Pulse 63  - Temp 36.7 ??C (98.1 ??F) (Oral)  - Wt (!) 135.3 kg (298 lb 4.8 oz)  - LMP 03/01/2002  - SpO2 98%  - BMI 49.64 kg/m??      Exam  General: normal appearance  EYES: Anicteric sclerae.  ENT: Oropharynx moist.  RESP: Relaxed respiratory effort. Clear to auscultation without wheezes or crackles.   CV: Regular rate and rhythm. Normal S1 and S2. No murmurs or gallops.  No lower extremity edema. Posterior tibial pulses are 2+ and symmetric.  abd exam: non tender, no masses, no HSM   MSK: No focal muscle tenderness.  SKIN:  Appropriately warm and moist.  NEURO: Stable gait and coordination.    Allergies:     Penicillins, Tramadol, Gabapentin, Pravastatin, and Simvastatin    Current Medications:     Current Outpatient Medications   Medication Sig Dispense Refill    acetaminophen (TYLENOL ARTHRITIS ORAL) Take by mouth.      albuterol 2.5 mg /3 mL (0.083 %) nebulizer solution Inhale 3 mL (2.5 mg total) by nebulization four (4) times a day. 100 mL 3    albuterol HFA 90 mcg/actuation inhaler INHALE 2 PUFFS BY MOUTH EVERY 6 HOURS AS NEEDED FOR WHEEZE 18 g 5    aspirin (ECOTRIN) 81 MG tablet Take 1 tablet (81 mg total) by mouth daily.      atorvastatin (LIPITOR) 10 MG tablet Take 1 tablet (10 mg total) by mouth daily. 90 tablet 3    diclofenac (VOLTAREN) 50 MG EC tablet Take 1 tablet (50 mg total) by mouth Two (2) times a day. For arthritis pain. 60 tablet 1    empty container (SHARPS-A-GATOR DISPOSAL SYSTEM) Misc Use as directed for sharps disposal 1 each 2    ergocalciferol-1,250 mcg, 50,000 unit, (DRISDOL) 1,250 mcg (50,000 unit) capsule TAKE 1 CAPSULE BY MOUTH ONE TIME PER WEEK 12 capsule 2    evolocumab 140 mg/mL PnIj Inject the contents of one pen (140 mg) under the skin every fourteen (14) days. 6 mL 3    fluticasone propionate (FLOVENT HFA) 110 mcg/actuation inhaler Inhale 2 puffs Two (2) times a day. As needed for cough 12 g 2    furosemide (LASIX) 20 MG tablet Take 1 tablet (20 mg total) by mouth Two (2) times a day. 180 tablet 1    LINZESS 145 mcg capsule TAKE 1 CAPSULE BY MOUTH EVERY DAY 90 capsule 1    methocarbamoL (ROBAXIN) 750 MG tablet Take 1 tablet (750 mg total) by mouth four (4) times a day. As needed for pain. 60 tablet 1    multivitamin (TAB-A-VITE/THERAGRAN) per tablet Take 1 tablet by mouth daily.      nicotine (NICODERM CQ) 14 mg/24 hr patch Place 1 patch on the skin daily. 28 patch 0 omeprazole (PRILOSEC) 20 MG capsule Take 1 capsule (20 mg total) by mouth daily. 90 capsule 0    topiramate (TOPAMAX) 25 MG tablet Take 2 tablets (50 mg total) by mouth nightly. 180 tablet 1    benzonatate (TESSALON) 200 MG capsule Take 1 capsule (200 mg total) by mouth Three (3) times a day as needed for cough for up to 7 days. 30 capsule 0    doxycycline (VIBRAMYCIN) 100 MG capsule Take 1 capsule (100 mg total) by mouth Two (2) times a day for 7 days. 14 capsule 0    DULoxetine (CYMBALTA) 30 MG capsule Take 1 capsule (30 mg total) by mouth daily. Week 2 can increase to 2 capsules po QD (Patient not taking: Reported on 02/13/2022) 60 capsule 0    fluconazole (DIFLUCAN) 150 MG tablet Take 1 tablet (150 mg total) by mouth once for 1 dose. 1 tablet 0    ibuprofen (MOTRIN) 400 MG tablet Take 1 tablet (400 mg total) by mouth every six (6) hours as needed for pain. 30 tablet 0    ipratropium-albuterol (DUO-NEB) 0.5-2.5 mg/3 mL nebulizer Inhale 3 mL every six (6) hours as needed. (Patient not taking: Reported on 03/17/2022)      methylPREDNISolone (MEDROL DOSEPACK) 4 mg tablet follow package directions 1 each 0     No current facility-administered medications  for this visit.           Note - This record has been created using [employer]. Chart creation errors have been sought, but may not always have been located. Such creation errors do not reflect on the standard of medical care.    Jenell Milliner, MD

## 2022-03-17 NOTE — Unmapped (Signed)
Regarding: believes bronchitis causing severe pain/difficulty breathing  ----- Message from [employer] sent at 03/17/2022  8:34 AM EDT -----  Bronchitis causing severe pain in shoulder blades radiating up to breast bone, causing difficulty breathing / scheduled at primary care [place] today 10/02 at 11am

## 2022-03-17 NOTE — Unmapped (Signed)
Upcoming Appt:  Future Appointments   Date Time Provider Department Center   03/17/2022 11:00 AM Jenell Milliner, MD [facility]   03/20/2022 10:00 AM [name], FNP [facility]       Recent:   What is the date of your last related visit?  9.27.23 ED  Related acute medications Rx'd:  Z pack and steroids  Home treatment tried:  albuterol inhaler and nebulizer, Flovent inhaler       Relevant:   Allergies: Penicillins, Tramadol, Gabapentin, Pravastatin, and Simvastatin  Medications: albuterol, Flovent   Health History: asthma  Weight: NA      [facility] patients only:  What was the date of your last cancer treatment (mm/dd/yy)?: NA  Was the treatment oral or infusion?: NA  Are you currently on TVEC (yes/no)?: NA      Reason for Disposition   [1] MODERATE asthma attack (e.g., SOB at rest, speaks in phrases, audible wheezes) AND [2] not resolved after 2 or 3 inhaler or nebulizer treatments given 20 minutes apart    Answer Assessment - Initial Assessment Questions  1. RESPIRATORY STATUS: Describe your breathing? (e.g., wheezing, shortness of breath, unable to speak, severe coughing)       Wheezing, SOB with activity, speaking in sentences, coughing  2. ONSET: When did this asthma attack begin?       9.25.23  3. TRIGGER: What do you think triggered this attack? (e.g., URI, exposure to pollen or other allergen, tobacco smoke)       URI  4. PEAK EXPIRATORY FLOW RATE (PEFR): Do you use a peak flow meter? If Yes, ask: What's the current peak flow? What's your personal best peak flow?       Does not have   5. SEVERITY: How bad is this attack?     - MILD: No SOB at rest, mild SOB with walking, speaks normally in sentences, can lie down, no retractions, pulse < 100. (GREEN Zone: PEFR 80-100%)    - MODERATE: SOB at rest, SOB with minimal exertion and prefers to sit, cannot lie down flat, speaks in phrases, mild retractions, audible wheezing, pulse 100-120. (YELLOW Zone: PEFR 50-79%)     - SEVERE: Struggling for each breath, speaks in single words, struggling to breathe, sitting hunched forward, retractions, usually loud wheezing, sometimes minimal wheezing because of decreased air movement, pulse > 120. (RED Zone: PEFR < 50%).       Mild to moderate  6. ASTHMA MEDICINES:  What treatments have you tried?     - INHALED QUICK RELIEF (RESCUE): What is your inhaled quick-relief medicine? (e.g., albuterol, salbutamol) Do you use an inhaler or a nebulizer? How frequently have you been using this medicine?    - CONTROLLER (LONG-TERM-CONTROL): Do you take an inhaled steroid? (e.g., Asmanex, Flovent, Pulmicort, Qvar)    Albuterol, flovent  7. INHALED QUICK-RELIEF TREATMENTS FOR THIS ATTACK: What treatments have you given yourself so far? and How many and how often? If using an inhaler, ask, How many puffs? Note: Routine treatments are 2 puffs every 4 hours as needed. Rescue treatments are 4 puffs repeated every 20 minutes, up to three times as needed.       Albuterol Neb at 0630, used albuterol and Flovent inhaler during the ca  8. OTHER SYMPTOMS: Do you have any other symptoms? (e.g., chest pain, coughing up yellow sputum, fever, runny nose)      No fever, no sputum, has shoulder pain  9. O2 SATURATION MONITOR:  Do you use an oxygen saturation monitor (pulse oximeter) at home? If Yes, What is your reading (oxygen level) today? What is your usual oxygen saturation reading? (e.g., 95%)      Does not have  10. PREGNANCY: Is there any chance you are pregnant? When was your last menstrual period?        No hysterectomy    Answer Assessment - Initial Assessment Questions  1. RESPIRATORY STATUS: Describe your breathing? (e.g., wheezing, shortness of breath, unable to speak, severe coughing)       Feels short of breath with activity, has wheezing, speaking in phrases,  2. ONSET: When did this breathing problem begin?         3. PATTERN Does the difficult breathing come and go, or has it been constant since it started?         4. SEVERITY: How bad is your breathing? (e.g., mild, moderate, severe)     - MILD: No SOB at rest, mild SOB with walking, speaks normally in sentences, can lie down, no retractions, pulse < 100.     - MODERATE: SOB at rest, SOB with minimal exertion and prefers to sit, cannot lie down flat, speaks in phrases, mild retractions, audible wheezing, pulse 100-120.     - SEVERE: Very SOB at rest, speaks in single words, struggling to breathe, sitting hunched forward, retractions, pulse > 120       Mild to moderate  5. RECURRENT SYMPTOM: Have you had difficulty breathing before? If Yes, ask: When was the last time? and What happened that time?       Yes this happens frequently  6. CARDIAC HISTORY: Do you have any history of heart disease? (e.g., heart attack, angina, bypass surgery, angioplasty)       Denies   7. LUNG HISTORY: Do you have any history of lung disease?  (e.g., pulmonary embolus, asthma, emphysema)      Asthma  8. CAUSE: What do you think is causing the breathing problem?       Bronchitis, states at ED they told her she has pneumonia on 9.27.23  9. OTHER SYMPTOMS: Do you have any other symptoms? (e.g., dizziness, runny nose, cough, chest pain, fever)      Pain between the shoulder blades and radiates to the front  10. O2 SATURATION MONITOR:  Do you use an oxygen saturation monitor (pulse oximeter) at home? If Yes, ask: What is your reading (oxygen level) today? What is your usual oxygen saturation reading? (e.g., 95%)        Does not have  11. PREGNANCY: Is there any chance you are pregnant? When was your last menstrual period?        No, hysterectomy  12. TRAVEL: Have you traveled out of the country in the last month? (e.g., travel history, exposures) denies        Nebulizer tx 0630  Steroids and Zpack finished yesterday    Protocols used: Breathing Difficulty-A-AH, Asthma Attack-A-AH

## 2022-03-21 DIAGNOSIS — K581 Irritable bowel syndrome with constipation: Principal | ICD-10-CM

## 2022-03-21 MED ORDER — LINZESS 145 MCG CAPSULE
ORAL_CAPSULE | Freq: Every day | ORAL | 1 refills | 90 days | Status: CP
Start: 2022-03-21 — End: 2023-03-21

## 2022-04-02 ENCOUNTER — Ambulatory Visit: Admit: 2022-04-02 | Discharge: 2022-04-03 | Payer: MEDICARE

## 2022-04-02 DIAGNOSIS — Z1231 Encounter for screening mammogram for malignant neoplasm of breast: Principal | ICD-10-CM

## 2022-04-02 DIAGNOSIS — N760 Acute vaginitis: Principal | ICD-10-CM

## 2022-04-02 DIAGNOSIS — G8929 Other chronic pain: Principal | ICD-10-CM

## 2022-04-02 DIAGNOSIS — F419 Anxiety disorder, unspecified: Principal | ICD-10-CM

## 2022-04-02 DIAGNOSIS — M2550 Pain in unspecified joint: Principal | ICD-10-CM

## 2022-04-02 DIAGNOSIS — E559 Vitamin D deficiency, unspecified: Principal | ICD-10-CM

## 2022-04-02 DIAGNOSIS — Z6841 Body Mass Index (BMI) 40.0 and over, adult: Principal | ICD-10-CM

## 2022-04-02 DIAGNOSIS — F17210 Nicotine dependence, cigarettes, uncomplicated: Principal | ICD-10-CM

## 2022-04-02 DIAGNOSIS — Z1211 Encounter for screening for malignant neoplasm of colon: Principal | ICD-10-CM

## 2022-04-02 DIAGNOSIS — R5383 Other fatigue: Principal | ICD-10-CM

## 2022-04-02 DIAGNOSIS — R7309 Other abnormal glucose: Principal | ICD-10-CM

## 2022-04-02 MED ORDER — FLUCONAZOLE 150 MG TABLET
ORAL_TABLET | Freq: Once | ORAL | 0 refills | 2.00000 days | Status: CP
Start: 2022-04-02 — End: 2022-04-02

## 2022-04-02 MED ORDER — DULOXETINE 60 MG CAPSULE,DELAYED RELEASE
ORAL_CAPSULE | 1 refills | 0.00000 days | Status: CP
Start: 2022-04-02 — End: ?

## 2022-04-02 MED ORDER — ERGOCALCIFEROL (VITAMIN D2) 1,250 MCG (50,000 UNIT) CAPSULE
ORAL_CAPSULE | ORAL | 2 refills | 0.00000 days | Status: CP
Start: 2022-04-02 — End: ?

## 2022-04-02 NOTE — Unmapped (Signed)
Assessment and Plan:     Alyssa Herrera was seen today for follow-up.    Diagnoses and all orders for this visit:  Morbid (severe) obesity due to excess calories (CMS-HCC)  Class 3 severe obesity with serious comorbidity and body mass index (BMI) of 50.0 to 59.9 in adult, unspecified obesity type (CMS-HCC)          -     Given co-morbidities, was referral to weight loss clinic 09/23--has not gotten a call        -     Referral coordinator investigating referral  -     POCT glycosylated hemoglobin (Hb A1C): 6.0 (increased from 5.5-5.7), offered metformin-pt considering    Anxiety          -     Lots of stressors--guardian of grand-daughter, financial stressors, delay of exam for disability benefits until 11/23        -    Increased Cymbalta from 30 mg -60 mg  -     DULoxetine (CYMBALTA) 60 MG capsule; Take 1 capsule po QD    Fatigue, unspecified type    -     POCT hemoglobin: 14.2       Other chronic pain  Arthralgia, unspecified joint          -     Despite PT, spine center referral, pt with ongoing pain, decrease in mobility        -     Referral to rheumatology, pain clinic  -     Cyclic Citrul Peptide Antibody, IgG  -     Ambulatory referral to Rheumatology; Future  -     Ambulatory referral to Pain Clinic; Future    Screening mammogram for breast cancer          -     Provided phone # to schedule  -     Mammography screening bilateral; Future    Screening for colon cancer              -     Given phone # to schedule  -     Colonoscopy; Future    Acute vaginitis          -     Secondary to recurrent abx for bronchitis, rx provided  -     fluconazole (DIFLUCAN) 150 MG tablet; Take 1 tablet (150 mg total) by mouth once for 1 dose. May repeat in 3 days if still symptoms    Vitamin D deficiency          -     Last Vitamin D: 14.5 (6/23)  -     ergocalciferol-1,250 mcg, 50,000 unit, (DRISDOL) 1,250 mcg (50,000 unit) capsule; 1 capsule weekly for 12 weeks        Cigarette nicotine dependence without complication              - Pt changed to menthol lights-now 0.5 packs per day          - NRT gum caused ulcerations in mouth, patches caused skin irritation         -  Contemplating nicotine free vapes to wean off of nicotine         -  Discussed clear plan to decrease tobacco use/stop tobacco      Other orders  -     INFLUENZA VACCINE (QUAD) IM - 6 MO-ADULT - PF        Barriers to recommended plan: financial  Return if symptoms worsen or fail to improve, for 3-4 months .      Subjective:     HPI: Alyssa Herrera is a 46 y.o. female here for Follow-up (Concerns for dry mouth and has been feeling fatigue.).    Follow-up:    Hx of bronchitis--feeling better, has sharp pains in between shoulder blades, resolves with movement  Overall, breathing, cough has improved  Does have ongoing yeast infection after 2 weeks of antibiotics    Widespread body pain  Back, B arms, hip pain pt states 'hurts all day', feeling tired  Feels as if 'something is wrong with body'  Taking medications as prescribed--taking ibuprofen, BC powders, 30 mg cymbalta  Has been to spine center 06/24/21, completed course of PT  Feels pain levels not improvement  States movement not helpful for pain  B hands stiff--has to pry open hands most mornings  Feeling off balance, some swelling in joints of B hands  Cardiac eco (4/22): Ef: >55%, myocardial perfusion study: normal   10/22: ANA: negative, R factor: 19.4, ESR 37, CRP: 8.0  A1c today 6.0, hgb 14.2   Spine x-ray:   Mildly displaced fracture of the proximal coccygeal segments.      Grade 1 anterolisthesis of L4 on L5 with associated facet arthropathy.       Obesity: referred to weight loss clinic 09/23, has not gotten phone call/appointment    Chronic GI problems: referred to [facility]--has not gotten a phone call/appointment    Prevention: agreeable for flu shot, mammogram, colonoscopy referral  Tobacco use: smoking light menthol cigarettes: 1 pack q 2-3 days, down from 1 pack per day  Contemplating nicotine free vape  NRT patches caused skin irritation, gum caused mouth ulcers    Disability claim: has appointment 11/23, needs mental health assessment co-signed by provider  I have reviewed past medical, surgical, medications, allergies, social and family histories today and updated them in Epic where appropriate.    ROS:       Review of systems negative unless otherwise noted as per HPI.      Objective:     Vitals:    04/02/22 0956   BP: 122/70   Pulse: 67   Temp: 36.4 ??C (97.5 ??F)   SpO2: 98%     Body mass index is 50.02 kg/m??.    Physical Exam  Vitals and nursing note reviewed.   Constitutional:       Appearance: Normal appearance.   HENT:      Head: Normocephalic and atraumatic.   Cardiovascular:      Rate and Rhythm: Normal rate and regular rhythm.      Pulses: Normal pulses.      Heart sounds: Normal heart sounds.   Pulmonary:      Effort: Pulmonary effort is normal.      Breath sounds: Normal breath sounds.   Musculoskeletal:         General: Normal range of motion.      Cervical back: Normal range of motion and neck supple.   Skin:     General: Skin is warm and dry.      Capillary Refill: Capillary refill takes less than 2 seconds.   Neurological:      General: No focal deficit present.      Mental Status: She is alert and oriented to person, place, and time. Mental status is at baseline.   Psychiatric:         Mood and Affect:  Mood normal.         Behavior: Behavior normal.         Thought Content: Thought content normal.         Judgment: Judgment normal.            Medication adherence and barriers to the treatment plan have been addressed. Opportunities to optimize healthy behaviors have been discussed. Patient / caregiver voiced understanding.   I personally spent 35 minutes face-to-face and non-face-to-face in the care of this patient, which includes all pre, intra, and post visit time on the date of service.  [name], DNP, FNP-C  [facility]  [phone] (F)    Note - This record has been created using [employer]. Chart creation errors have been sought, but may not always have been located. Such creation errors do not reflect on the standard of medical care.

## 2022-04-02 NOTE — Unmapped (Signed)
Please call: [phone] to schedule colonoscopy.  Thank you.     Please call [phone] to schedule your imaging study.    Mammogram: please call [phone].    [facility]  [address]  [place], [place] [address]      [facility]  [address]  [place], [place] [address]

## 2022-04-02 NOTE — Unmapped (Signed)
Patient called to send communication of the Correct Fax#:[phone] of the Gastrologist Dr. Smitty Cords.     Please contact patient for additional quesitons/concerns - and once referral has been submitted.    Thank you

## 2022-04-03 DIAGNOSIS — R7309 Other abnormal glucose: Principal | ICD-10-CM

## 2022-04-03 MED ORDER — BLOOD-GLUCOSE METER KIT WRAPPER
0 refills | 0 days | Status: CP
Start: 2022-04-03 — End: 2023-04-03

## 2022-04-03 MED ORDER — METFORMIN ER 500 MG TABLET,EXTENDED RELEASE 24 HR
ORAL_TABLET | Freq: Every day | ORAL | 3 refills | 90 days | Status: CP
Start: 2022-04-03 — End: 2023-04-03

## 2022-04-04 DIAGNOSIS — R7309 Other abnormal glucose: Principal | ICD-10-CM

## 2022-04-04 LAB — CYCLIC CITRUL PEPTIDE ANTIBODY, IGG
CCP ANTIBODIES: 7.9 {ELISA'U} — ABNORMAL HIGH (ref <7.0)
CCP IGG ANTIBODIES: UNDETERMINED — AB

## 2022-04-04 MED ORDER — BLOOD GLUCOSE TEST STRIPS
ORAL_STRIP | Freq: Two times a day (BID) | 3 refills | 0 days | Status: CP
Start: 2022-04-04 — End: 2022-07-03

## 2022-04-09 ENCOUNTER — Ambulatory Visit: Admit: 2022-04-09 | Discharge: 2022-04-10 | Payer: MEDICARE

## 2022-04-11 DIAGNOSIS — M2550 Pain in unspecified joint: Principal | ICD-10-CM

## 2022-04-23 DIAGNOSIS — J45909 Unspecified asthma, uncomplicated: Principal | ICD-10-CM

## 2022-04-23 MED ORDER — IBUPROFEN 400 MG TABLET
ORAL_TABLET | Freq: Four times a day (QID) | ORAL | 0 refills | 8 days | PRN
Start: 2022-04-23 — End: 2023-04-23

## 2022-04-24 MED ORDER — IBUPROFEN 400 MG TABLET
ORAL_TABLET | Freq: Four times a day (QID) | ORAL | 0 refills | 8 days | Status: CP | PRN
Start: 2022-04-24 — End: 2023-04-24

## 2022-04-24 NOTE — Unmapped (Signed)
Patient is requesting the following refill  Requested Prescriptions     Pending Prescriptions Disp Refills    ibuprofen (MOTRIN) 400 MG tablet 30 tablet 0     Sig: Take 1 tablet (400 mg total) by mouth every six (6) hours as needed for pain.       Recent Visits  Date Type Provider Dept   04/02/22 Office Visit [name], FNP [facility] [address] [facility]   03/17/22 Office Visit Jenell Milliner, MD [facility] [address] [facility]   02/13/22 Office Visit Johnn Hai, Loleta Rose, FNP [place] [address] [facility]   11/18/21 Office Visit Johnn Hai, Loleta Rose, FNP [facility] [facility] [address] [facility]   08/15/21 Office Visit [name], FNP [facility]   05/15/21 Office Visit Johnn Hai, Loleta Rose, FNP [place]   Showing recent visits within past 365 days with a meds authorizing provider and meeting all other requirements  Future Appointments  No visits were found meeting these conditions.  Showing future appointments within next 365 days with a meds authorizing provider and meeting all other requirements       Labs: Not applicable this refill

## 2022-04-24 NOTE — Unmapped (Signed)
[facility] Refill Coordination Note    Alyssa Herrera, DOB: 10/25/1975  Phone: [phone] (work)      All above HIPAA information was verified with patient.     - I spoke with patient and she would like her Repatha filled. She has 2 pens remaining and she ok with delivery on 05/07/22.        04/23/2022     7:00 PM   Specialty Rx Medication Refill Questionnaire   Which Medications would you like refilled and shipped? Cholesterol medication this is the only delivery   Please list all current allergies: Pincillin tramodol   Have you missed any doses in the last 30 days? No   Have you had any changes to your medication(s) since your last refill? No   How many days remaining of each medication do you have at home? 2   If receiving an injectable medication, next injection date is 05/02/2022   Have you experienced any side effects in the last 30 days? No   Please enter the full address (street address, city, state, zip code) where you would like your medication(s) to be delivered to. [address] [place] [address]   Please specify on which day you would like your medication(s) to arrive. Note: if you need your medication(s) within 3 days, please call the pharmacy to schedule your order at [phone]  05/08/2022   Has your insurance changed since your last refill? No   Would you like a pharmacist to call you to discuss your medication(s)? No   Do you require a signature for your package? (Note: if we are billing Medicare Part B or your order contains a controlled substance, we will require a signature) No         Completed refill call assessment today to schedule patient's medication shipment from the [facility] [phone]).  All relevant notes have been reviewed.       Confirmed patient received a Conservation officer, historic buildings and a Surveyor, mining with first shipment. The patient will receive a drug information handout for each medication shipped and additional FDA Medication Guides as required.         REFERRAL TO PHARMACIST     Referral to the pharmacist: Not needed      [facility]     Shipping address confirmed in Epic.     Delivery Scheduled: Yes, Expected medication delivery date: 05/07/22.     Medication will be delivered via Same Day Courier to the prescription address in Epic WAM.    Unk Lightning   [facility] Specialty Technician

## 2022-05-04 MED ORDER — OMEPRAZOLE 20 MG CAPSULE,DELAYED RELEASE
ORAL_CAPSULE | Freq: Every day | ORAL | 0 refills | 0 days
Start: 2022-05-04 — End: ?

## 2022-05-05 MED ORDER — OMEPRAZOLE 20 MG CAPSULE,DELAYED RELEASE
ORAL_CAPSULE | Freq: Every day | ORAL | 0 refills | 90 days | Status: CP
Start: 2022-05-05 — End: ?

## 2022-05-07 MED FILL — REPATHA SURECLICK 140 MG/ML SUBCUTANEOUS PEN INJECTOR: SUBCUTANEOUS | 84 days supply | Qty: 6 | Fill #0

## 2022-05-26 ENCOUNTER — Other Ambulatory Visit: Payer: Self-pay

## 2022-05-26 ENCOUNTER — Encounter: Payer: Self-pay | Admitting: Emergency Medicine

## 2022-05-26 ENCOUNTER — Emergency Department
Admission: EM | Admit: 2022-05-26 | Discharge: 2022-05-26 | Disposition: A | Discharge status: Home / Self Care | Payer: Medicare Other | Attending: Emergency Medicine | Admitting: Emergency Medicine

## 2022-05-26 DIAGNOSIS — J45909 Unspecified asthma, uncomplicated: Secondary | ICD-10-CM | POA: Diagnosis not present

## 2022-05-26 DIAGNOSIS — R509 Fever, unspecified: Secondary | ICD-10-CM | POA: Diagnosis present

## 2022-05-26 DIAGNOSIS — U071 COVID-19: Secondary | ICD-10-CM | POA: Diagnosis not present

## 2022-05-26 LAB — RESP PANEL BY RT-PCR (FLU A&B, COVID) ARPGX2
Influenza A by PCR: NEGATIVE
Influenza B by PCR: NEGATIVE
SARS Coronavirus 2 by RT PCR: POSITIVE — AB

## 2022-05-26 MED ORDER — GUAIFENESIN-CODEINE 100-10 MG/5ML PO SOLN: 5.0000 mL | ORAL | 0 refills | Status: DC | PRN

## 2022-05-26 NOTE — ED Provider Notes (Signed)
[facility] Provider Note    Event Date/Time   First MD Initiated Contact with Patient 05/26/22 [phone]     (approximate)   History   Fever, Cough, Generalized Body Aches, and Headache   HPI  Kendra Clark is a 46 y.o. female   presents to the ED with complaint of fever, chills, body ache, headache, cough that just started.  Patient states she took a home COVID test yesterday which was positive.  Patient states the coughing and the body aches are keeping her from being able to get comfortable to sleep at night.  Patient reports getting 3 COVID vaccines.  Patient has a history of mitral valve disease, degenerative disc disease, PTSD, anxiety, asthma, IBS, bipolar disorder.      Physical Exam   Triage Vital Signs: ED Triage Vitals  Enc Vitals Group     BP 05/26/22 0902 138/80     Pulse Rate 05/26/22 0902 81     Resp 05/26/22 0902 18     Temp 05/26/22 0902 98.9 F (37.2 C)     Temp Source 05/26/22 0902 Oral     SpO2 05/26/22 0902 96 %     Weight 05/26/22 0900 299 lb (135.6 kg)     Height 05/26/22 0900 5\' 5"  (1.651 m)     Head Circumference --      Peak Flow --      Pain Score 05/26/22 0900 9     Pain Loc --      Pain Edu? --      Excl. in GC? --     Most recent vital signs: Vitals:   05/26/22 0902  BP: 138/80  Pulse: 81  Resp: 18  Temp: 98.9 F (37.2 C)  SpO2: 96%     General: Awake, no distress.  Able to talk in complete sentences without any difficulty. CV:  Good peripheral perfusion.  Heart regular rate and rhythm. Resp:  Normal effort.  Lungs are clear bilaterally.  Normal cough noted during exam. Abd:  No distention.  Other:     ED Results / Procedures / Treatments   Labs (all labs ordered are listed, but only abnormal results are displayed) Labs Reviewed  RESP PANEL BY RT-PCR (FLU A&B, COVID) ARPGX2 - Abnormal; Notable for the following components:      Result Value   SARS Coronavirus 2 by RT PCR POSITIVE (*)    All  other components within normal limits       PROCEDURES:  Critical Care performed:   Procedures   MEDICATIONS ORDERED IN ED: Medications - No data to display   IMPRESSION / MDM / ASSESSMENT AND PLAN / ED COURSE  I reviewed the triage vital signs and the nursing notes.   Differential diagnosis includes, but is not limited to, influenza, COVID, viral URI, bronchitis.  46 year old female presents to the ED with complaint of cough, congestion, fever, body aches that started in the last couple days.  Patient states she took a COVID test yesterday at home which was positive.  She is unaware of any known COVID exposure but has had 3 COVID vaccines.  Test today in the emergency department is positive for COVID and patient was made aware.  A note to remain out of work was written and a prescription for guaifenesin with codeine was sent to the pharmacy as needed for cough and congestion.  Patient is aware that she cannot drive or operate machinery while taking the cough medication as it  does contain a narcotic.  She is encouraged to continue with Tylenol, ibuprofen and increase fluids to stay hydrated.      Patient's presentation is most consistent with acute complicated illness / injury requiring diagnostic workup.  FINAL CLINICAL IMPRESSION(S) / ED DIAGNOSES   Final diagnoses:  COVID     Rx / DC Orders   ED Discharge Orders          Ordered    guaiFENesin-codeine 100-10 MG/5ML syrup  Every 4 hours PRN        05/26/22 1012             Note:  This document was prepared using Dragon voice recognition software and may include unintentional dictation errors.   Tommi Rumps, PA-C 05/26/22 1017    Phineas Semen, MD 05/26/22 1032

## 2022-05-26 NOTE — ED Triage Notes (Signed)
Pt to ED via POV for fever, chills, body aches, headache, cough. Pt states that she took an at home COVID test yesterday and it came back positive. Pt states that she was unable to sleep last night because she was not able to get comfortable.

## 2022-05-26 NOTE — Discharge Instructions (Signed)
Follow-up with your primary care provider if any continued problems.  Continue regular medication.  A prescription for Robitussin AC was sent to the pharmacy which contains a narcotic.  Do not drive or operate machinery while taking this medication for cough and congestion.  Increase fluids to stay hydrated and continue with Tylenol or ibuprofen as needed for body aches, headache or fever.  Return to the emergency department if any severe worsening of your symptoms such as shortness of breath or difficulty breathing.

## 2022-05-27 DIAGNOSIS — U071 COVID: Principal | ICD-10-CM

## 2022-05-27 MED ORDER — PAXLOVID 300 MG (150 MG X 2)-100 MG TABLETS IN A DOSE PACK
ORAL_TABLET | 0 refills | 0 days | Status: CP
Start: 2022-05-27 — End: ?

## 2022-05-27 NOTE — Unmapped (Signed)
Spoke to patient following MyChart message, patient seen at [facility] yesterday, tested positive for COVID.  Symptoms started May 25, 2022--patient given prescription for cough syrup, no other medications provided at ER visit.  Patient states feeling bad, body aches--drinking fluids, alternating Tylenol and ibuprofen.  Denies shortness of breath or chest pains  Prescription for Paxlovid prescribed and sent to preferred pharmacy.  Patient aware of pill burden and potential side effects/drug interactions.    Return and ER precautions reviewed with patient.  Patient verbalized understanding, agreeable with plan of care.

## 2022-06-04 NOTE — Unmapped (Signed)
[place] [facility]  SUCCESSFUL ENGAGEMENT    To patients reading this note: Please be advised that the primary purpose of this note is for [place] to keep track of your care and communicate with other members of your medical team. These suggested changes are intended to enhance the overall care you receive.  Approved changes will be communicated to you by your care team.     Population:  [employer]      Alyssa Herrera is a 46 y.o. female identified as being part of  SPC (Statin Use for Patient's with Cardiovascular Disease)quality measure(s), based on payer reports and chart review. Contacted the Patient to assess appropriateness of initiating statin therapy.    Plan:  Recommend prescriber initiate new order for atorvastatin 10 mg daily #100.  Counseled patient on benefits of statin therapy.    Notes and recommendations from today's outreach have been sent via Epic to [employer], AGNP.    Pt has atorvastatin 10 mg Rx on file, but it hasn't been filled in 2023. She is amenable to starting it.     Action:  Please send a new Rx for atorvastatin 10 mg daily #100 to [facility] [place] listed as her preferred pharmacy if you are amenable.    Leone Haven, CPP , PharmD  [place] [facility]

## 2022-06-05 MED ORDER — ATORVASTATIN 10 MG TABLET
ORAL_TABLET | Freq: Every day | ORAL | 3 refills | 90 days | Status: CP
Start: 2022-06-05 — End: ?

## 2022-07-03 ENCOUNTER — Ambulatory Visit
Admit: 2022-07-03 | Discharge: 2022-07-04 | Payer: MEDICARE | Attending: Hematology & Oncology | Primary: Hematology & Oncology

## 2022-07-03 DIAGNOSIS — Z202 Contact with and (suspected) exposure to infections with a predominantly sexual mode of transmission: Principal | ICD-10-CM

## 2022-07-03 DIAGNOSIS — M549 Dorsalgia, unspecified: Principal | ICD-10-CM

## 2022-07-03 DIAGNOSIS — M2550 Pain in unspecified joint: Principal | ICD-10-CM

## 2022-07-03 LAB — BASIC METABOLIC PANEL
ANION GAP: 5 mmol/L (ref 5–14)
BLOOD UREA NITROGEN: 11 mg/dL (ref 9–23)
BUN / CREAT RATIO: 16
CALCIUM: 9.6 mg/dL (ref 8.7–10.4)
CHLORIDE: 109 mmol/L — ABNORMAL HIGH (ref 98–107)
CO2: 26 mmol/L (ref 20.0–31.0)
CREATININE: 0.7 mg/dL
EGFR CKD-EPI (2021) FEMALE: >90 mL/min/{1.73_m2} (ref >=60)
GLUCOSE RANDOM: 88 mg/dL (ref 70–179)
POTASSIUM: 3.8 mmol/L (ref 3.4–4.8)
SODIUM: 140 mmol/L (ref 135–145)

## 2022-07-03 MED ADMIN — ketorolac (TORADOL) injection 30 mg: 30 mg | INTRAMUSCULAR | @ 20:00:00 | Stop: 2022-07-03

## 2022-07-03 NOTE — Unmapped (Signed)
Assessment and Plan:     Alyssa Herrera was seen today for dysuria.    Diagnoses and all orders for this visit:    Possible exposure to STD  -     Wet prep, genital  -     Chlamydia/Gonorrhoeae NAA  -     HIV Antigen/Antibody Combo    Back pain, unspecified back location, unspecified back pain laterality, unspecified chronicity  -     POCT urinalysis dipstick; Future  -     POCT urinalysis dipstick  -     Basic metabolic panel    Arthralgia, unspecified joint  -     ketorolac (TORADOL) injection 30 mg       I personally spent 30 minutes face-to-face and non-face-to-face in the care of this patient, which includes all pre, intra, and post visit time on the date of service. All documented time was specific to the E/M visit and does not include any procedures that may have been performed.    Subjective:     HPI: Alyssa Herrera is a 47 y.o. female here for Arthritis (Has pain all over and in joints and numbness./Having lower back pain and pressure when she urinates with burning./Check for STD, Condom had broke)     Dysuria: Patient request UA to be checked.    Vaginal Discharge: Thick white discharge. Patient states inside vagina is itching. Patient states there is a abnormal odor.  Fishy    Joint pain:  Chronic . Patient has appointment with rheumatology in march. Patient request toradol injection.     STD check: Check for STD, Condom had broke      ROS negative unless otherwise noted in HPI.    Allergies:     Penicillins, Tramadol, Gabapentin, Pravastatin, and Simvastatin    Current Medications:     Current Outpatient Medications   Medication Sig Dispense Refill    clindamycin (CLEOCIN) 150 MG capsule TAKE 1 CAPSULE BY MOUTH TWICE A DAY FOR 14 DAYS      acetaminophen (TYLENOL ARTHRITIS ORAL) Take by mouth.      albuterol 2.5 mg /3 mL (0.083 %) nebulizer solution Inhale 3 mL (2.5 mg total) by nebulization four (4) times a day. 100 mL 3    albuterol HFA 90 mcg/actuation inhaler INHALE 2 PUFFS BY MOUTH EVERY 6 HOURS AS NEEDED FOR WHEEZE 18 g 5    aspirin (ECOTRIN) 81 MG tablet Take 1 tablet (81 mg total) by mouth daily.      atorvastatin (LIPITOR) 10 MG tablet Take 1 tablet (10 mg total) by mouth daily. 90 tablet 3    blood sugar diagnostic (GLUCOSE BLOOD) Strp by Other route two (2) times a day. Type 2 diabetes;  test 2 times daily 200 strip 3    blood-glucose meter kit Use as instructed 1 each 0    diclofenac (VOLTAREN) 50 MG EC tablet Take 1 tablet (50 mg total) by mouth Two (2) times a day. For arthritis pain. 60 tablet 1    DULoxetine (CYMBALTA) 60 MG capsule Take 1 capsule po QD 90 capsule 1    empty container (SHARPS-A-GATOR DISPOSAL SYSTEM) Misc Use as directed for sharps disposal 1 each 2    ergocalciferol-1,250 mcg, 50,000 unit, (DRISDOL) 1,250 mcg (50,000 unit) capsule 1 capsule weekly for 12 weeks 12 capsule 2    evolocumab 140 mg/mL PnIj Inject the contents of one pen (140 mg) under the skin every fourteen (14) days. 6 mL 3    fluticasone propionate (FLOVENT  HFA) 110 mcg/actuation inhaler Inhale 2 puffs Two (2) times a day. As needed for cough 12 g 2    furosemide (LASIX) 20 MG tablet Take 1 tablet (20 mg total) by mouth Two (2) times a day. 180 tablet 1    ibuprofen (MOTRIN) 400 MG tablet Take 1 tablet (400 mg total) by mouth every six (6) hours as needed for pain. 30 tablet 0    ipratropium-albuterol (DUO-NEB) 0.5-2.5 mg/3 mL nebulizer Inhale 3 mL every six (6) hours as needed. (Patient not taking: Reported on 03/17/2022)      linaCLOtide (LINZESS) 145 mcg capsule Take 1 capsule (145 mcg total) by mouth daily. 90 capsule 1    metFORMIN (GLUCOPHAGE-XR) 500 MG 24 hr tablet Take 1 tablet (500 mg total) by mouth daily before breakfast. 90 tablet 3    methocarbamoL (ROBAXIN) 750 MG tablet Take 1 tablet (750 mg total) by mouth four (4) times a day. As needed for pain. 60 tablet 1    multivitamin (TAB-A-VITE/THERAGRAN) per tablet Take 1 tablet by mouth daily.      nirmatrelvir-ritonavir (PAXLOVID CO-PACK, EUA,) 300 mg (150 mg x 2)-100 mg tablet See package instructions. 30 tablet 0    omeprazole (PRILOSEC) 20 MG capsule TAKE 1 CAPSULE BY MOUTH EVERY DAY 90 capsule 0    topiramate (TOPAMAX) 25 MG tablet Take 2 tablets (50 mg total) by mouth nightly. 180 tablet 1     No current facility-administered medications for this visit.       Objective:     Vitals:    07/03/22 1101   BP: 118/76   Pulse: 82   Temp: 36.6 ??C (97.8 ??F)   SpO2: 96%     Body mass index is 51.75 kg/m??.    Physical Exam  Constitutional:       Appearance: Normal appearance. She is obese.   HENT:      Head: Normocephalic and atraumatic.      Nose: Nose normal.      Mouth/Throat:      Mouth: Mucous membranes are moist.      Pharynx: Oropharynx is clear.   Cardiovascular:      Rate and Rhythm: Normal rate and regular rhythm.      Pulses: Normal pulses.      Heart sounds: Normal heart sounds.   Pulmonary:      Effort: Pulmonary effort is normal.      Breath sounds: Normal breath sounds.   Abdominal:      General: Abdomen is flat. Bowel sounds are normal.      Palpations: Abdomen is soft.   Musculoskeletal:         General: Normal range of motion.   Skin:     General: Skin is warm and dry.      Capillary Refill: Capillary refill takes less than 2 seconds.   Neurological:      General: No focal deficit present.      Mental Status: She is alert and oriented to person, place, and time. Mental status is at baseline.   Psychiatric:         Mood and Affect: Mood normal.         Behavior: Behavior normal.         Thought Content: Thought content normal.         Judgment: Judgment normal.          No results found for this visit on 07/03/22.    Legrand Pitts, MD

## 2022-07-03 NOTE — Unmapped (Signed)
Patient states she just left [facility] , missed a phone call, person did not leave message or TE  Please contact patient if needed  [facility]

## 2022-07-03 NOTE — Unmapped (Signed)
Addended by: Tamsen Snider on: 07/03/2022 02:46 PM     Modules accepted: Orders

## 2022-07-06 LAB — HIV ANTIGEN/ANTIBODY COMBO: HIV ANTIGEN/ANTIBODY COMBO: NONREACTIVE
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
## 2022-07-23 ENCOUNTER — Ambulatory Visit: Admit: 2022-07-23 | Discharge: 2022-07-24 | Payer: MEDICARE | Attending: Anesthesiology | Primary: Anesthesiology

## 2022-07-23 DIAGNOSIS — M7918 Myalgia, other site: Principal | ICD-10-CM

## 2022-07-23 DIAGNOSIS — M2550 Pain in unspecified joint: Principal | ICD-10-CM

## 2022-07-23 MED ORDER — NALTREXONE (BULK) 100 % POWDER
Freq: Every day | ORAL | 0 refills | 0 days
Start: 2022-07-23 — End: ?

## 2022-07-23 MED ORDER — NALTREXONE 4.5 MG CAPSULE
ORAL_CAPSULE | Freq: Every day | ORAL | 0 refills | 0 days | Status: CP
Start: 2022-07-23 — End: ?

## 2022-07-23 MED ORDER — TIZANIDINE 4 MG TABLET
ORAL_TABLET | Freq: Four times a day (QID) | ORAL | 1 refills | 30 days | Status: CP | PRN
Start: 2022-07-23 — End: 2022-09-21

## 2022-07-23 NOTE — Unmapped (Addendum)
It was nice to meet you today.    We have prescribed tizanidine as a muscle relaxant to take for your pain. It can make you sleepy so please refrain from taking it if you are driving.    We also prescribed a one month supply of low dose naltrexone that you can get from calling the [facility]. The number is provided below. If you find that it is beneficial, you can message [place] via MyChart and let us know you would like a refill.     We also sent in a prescription for a TENs unit.     [facility]  [phone]

## 2022-07-23 NOTE — Unmapped (Signed)
Addended by: Fabienne Bruns on: 07/23/2022 10:40 AM     Modules accepted: Level of Service

## 2022-07-24 MED ORDER — NALTREXONE 4.5 MG CAPSULE
ORAL_CAPSULE | Freq: Every day | ORAL | 0 refills | 0 days | Status: CP
Start: 2022-07-24 — End: ?

## 2022-07-24 NOTE — Unmapped (Signed)
This was sent to a regular pharmacy. Please send to [facility]

## 2022-07-25 NOTE — Unmapped (Signed)
Faxed to [facility] # [phone] IFC/TENS Rx order/visit notes/ billing info. Confirmation of Rx order scanned to media tab.

## 2022-07-29 ENCOUNTER — Emergency Department: Payer: 59

## 2022-07-29 ENCOUNTER — Emergency Department
Admission: EM | Admit: 2022-07-29 | Discharge: 2022-07-29 | Disposition: A | Discharge status: Home / Self Care | Payer: 59 | Attending: Emergency Medicine | Admitting: Emergency Medicine

## 2022-07-29 DIAGNOSIS — F319 Bipolar disorder, unspecified: Secondary | ICD-10-CM | POA: Insufficient documentation

## 2022-07-29 DIAGNOSIS — K760 Fatty (change of) liver, not elsewhere classified: Secondary | ICD-10-CM | POA: Insufficient documentation

## 2022-07-29 DIAGNOSIS — R079 Chest pain, unspecified: Secondary | ICD-10-CM

## 2022-07-29 DIAGNOSIS — Z1152 Encounter for screening for COVID-19: Secondary | ICD-10-CM | POA: Diagnosis not present

## 2022-07-29 DIAGNOSIS — Z8616 Personal history of COVID-19: Secondary | ICD-10-CM | POA: Insufficient documentation

## 2022-07-29 DIAGNOSIS — R2990 Unspecified symptoms and signs involving the nervous system: Secondary | ICD-10-CM

## 2022-07-29 DIAGNOSIS — J45909 Unspecified asthma, uncomplicated: Secondary | ICD-10-CM | POA: Insufficient documentation

## 2022-07-29 DIAGNOSIS — R519 Headache, unspecified: Secondary | ICD-10-CM | POA: Diagnosis not present

## 2022-07-29 DIAGNOSIS — R0789 Other chest pain: Secondary | ICD-10-CM | POA: Insufficient documentation

## 2022-07-29 DIAGNOSIS — R29810 Facial weakness: Secondary | ICD-10-CM

## 2022-07-29 DIAGNOSIS — G43009 Migraine without aura, not intractable, without status migrainosus: Secondary | ICD-10-CM

## 2022-07-29 DIAGNOSIS — Z79899 Other long term (current) drug therapy: Secondary | ICD-10-CM | POA: Insufficient documentation

## 2022-07-29 DIAGNOSIS — G510 Bell's palsy: Secondary | ICD-10-CM | POA: Diagnosis not present

## 2022-07-29 DIAGNOSIS — F17210 Nicotine dependence, cigarettes, uncomplicated: Secondary | ICD-10-CM | POA: Diagnosis not present

## 2022-07-29 DIAGNOSIS — R471 Dysarthria and anarthria: Secondary | ICD-10-CM | POA: Diagnosis not present

## 2022-07-29 DIAGNOSIS — E785 Hyperlipidemia, unspecified: Secondary | ICD-10-CM | POA: Diagnosis not present

## 2022-07-29 LAB — COMPREHENSIVE METABOLIC PANEL
ALT: 18 U/L (ref 0–44)
AST: 19 U/L (ref 15–41)
Albumin: 4 g/dL (ref 3.5–5.0)
Alkaline Phosphatase: 44 U/L (ref 38–126)
Anion gap: 13 (ref 5–15)
BUN: 12 mg/dL (ref 6–20)
CO2: 22 mmol/L (ref 22–32)
Calcium: 9 mg/dL (ref 8.9–10.3)
Chloride: 104 mmol/L (ref 98–111)
Creatinine, Ser: 0.8 mg/dL (ref 0.44–1.00)
GFR, Estimated: >60 mL/min (ref >60)
Glucose, Bld: 84 mg/dL (ref 70–99)
Potassium: 3.8 mmol/L (ref 3.5–5.1)
Sodium: 139 mmol/L (ref 135–145)
Total Bilirubin: 0.7 mg/dL (ref 0.3–1.2)
Total Protein: 7.4 g/dL (ref 6.5–8.1)

## 2022-07-29 LAB — ETHANOL: Alcohol, Ethyl (B): <10 mg/dL (ref <10)

## 2022-07-29 LAB — DIFFERENTIAL
Abs Immature Granulocytes: 0.02 10*3/uL (ref 0.00–0.07)
Basophils Absolute: 0.1 10*3/uL (ref 0.0–0.1)
Basophils Relative: 1 %
Eosinophils Absolute: 0.2 10*3/uL (ref 0.0–0.5)
Eosinophils Relative: 2 %
Immature Granulocytes: 0 %
Lymphocytes Relative: 49 %
Lymphs Abs: 4.9 10*3/uL — ABNORMAL HIGH (ref 0.7–4.0)
Monocytes Absolute: 0.8 10*3/uL (ref 0.1–1.0)
Monocytes Relative: 8 %
Neutro Abs: 3.9 10*3/uL (ref 1.7–7.7)
Neutrophils Relative %: 40 %

## 2022-07-29 LAB — RESP PANEL BY RT-PCR (RSV, FLU A&B, COVID)  RVPGX2
Influenza A by PCR: NEGATIVE
Influenza B by PCR: NEGATIVE
Resp Syncytial Virus by PCR: NEGATIVE
SARS Coronavirus 2 by RT PCR: NEGATIVE

## 2022-07-29 LAB — CBC
HCT: 44.2 % (ref 36.0–46.0)
Hemoglobin: 14.5 g/dL (ref 12.0–15.0)
MCH: 30.7 pg (ref 26.0–34.0)
MCHC: 32.8 g/dL (ref 30.0–36.0)
MCV: 93.4 fL (ref 80.0–100.0)
Platelets: 322 10*3/uL (ref 150–400)
RBC: 4.73 MIL/uL (ref 3.87–5.11)
RDW: 12 % (ref 11.5–15.5)
WBC: 9.8 10*3/uL (ref 4.0–10.5)
nRBC: 0 % (ref 0.0–0.2)

## 2022-07-29 LAB — TROPONIN I (HIGH SENSITIVITY)
Troponin I (High Sensitivity): 4 ng/L (ref <18)
Troponin I (High Sensitivity): 4 ng/L (ref <18)

## 2022-07-29 LAB — APTT: aPTT: 31 seconds (ref 24–36)

## 2022-07-29 LAB — PROTIME-INR
INR: 1 (ref 0.8–1.2)
Prothrombin Time: 12.8 seconds (ref 11.4–15.2)

## 2022-07-29 LAB — CBG MONITORING, ED: Glucose-Capillary: 95 mg/dL (ref 70–99)

## 2022-07-29 LAB — POC URINE PREG, ED: Preg Test, Ur: NEGATIVE

## 2022-07-29 MED ORDER — LORAZEPAM 2 MG/ML IJ SOLN
1.0000 mg | Freq: Once | INTRAMUSCULAR | Status: AC | PRN
  Administered 2022-07-29: 1 mg via INTRAVENOUS
  Filled 2022-07-29: qty 1

## 2022-07-29 MED ORDER — SODIUM CHLORIDE 0.9% FLUSH
3.0000 mL | Freq: Once | INTRAVENOUS | Status: AC
  Administered 2022-07-29: 3 mL via INTRAVENOUS

## 2022-07-29 MED ORDER — ACYCLOVIR 400 MG PO TABS: 400.0000 mg | ORAL_TABLET | Freq: Every day | ORAL | 0 refills | Status: AC

## 2022-07-29 MED ORDER — IOHEXOL 350 MG/ML SOLN
100.0000 mL | Freq: Once | INTRAVENOUS | Status: AC | PRN
  Administered 2022-07-29: 100 mL via INTRAVENOUS

## 2022-07-29 MED ORDER — ASPIRIN 325 MG PO TABS
325.0000 mg | ORAL_TABLET | Freq: Every day | ORAL | Status: DC
  Administered 2022-07-29: 325 mg via ORAL
  Filled 2022-07-29: qty 1

## 2022-07-29 MED ORDER — KETOROLAC TROMETHAMINE 15 MG/ML IJ SOLN
15.0000 mg | Freq: Once | INTRAMUSCULAR | Status: AC
  Administered 2022-07-29: 15 mg via INTRAVENOUS
  Filled 2022-07-29: qty 1

## 2022-07-29 MED ORDER — HYPROMELLOSE (GONIOSCOPIC) 2.5 % OP SOLN: 2.0000 [drp] | Freq: Four times a day (QID) | OPHTHALMIC | 12 refills | Status: AC

## 2022-07-29 MED ORDER — PREDNISONE 50 MG PO TABS: ORAL_TABLET | ORAL | 0 refills | Status: AC

## 2022-07-29 NOTE — Unmapped (Signed)
Alyssa Herrera requested a refill of their REPATHA SURECLICK via IVR. The [facility] has scheduled delivery per the patients request via Same Day Courier to be delivered to their prescription address on 07/30/22.

## 2022-07-29 NOTE — Progress Notes (Signed)
CODE STROKE- PHARMACY COMMUNICATION   Time CODE STROKE called/page received:1722  Time response to CODE STROKE was made (in person or via phone): 1725  Time Stroke Kit retrieved from [place] (only if needed):N/A  Name of Provider/Nurse contacted:Dr. Stack/Amanda Deal, RN  Past Medical History:  Diagnosis Date   Anxiety    Asthma    Atopy    Bipolar disorder ([place])    Degenerative disc disease, lumbar    Depression    Hiatal hernia    History of mitral valve disease    Hyperlipidemia    IBS (irritable bowel syndrome)    Osteoporosis    PTSD (post-traumatic stress disorder)    Prior to Admission medications   Medication Sig Start Date End Date Taking? Authorizing Provider  azelastine (OPTIVAR) 0.05 % ophthalmic solution INSTILL 1 DROP INTO BOTH EYES TWICE A DAY FOR 10 DAYS AS NEEDED 03/03/18   [provider]  diclofenac (VOLTAREN) 75 MG EC tablet Take 75 mg by mouth 2 (two) times daily.    [provider]  esomeprazole (NEXIUM) 40 MG capsule Take by mouth. 06/26/15   [provider]  furosemide (LASIX) 20 MG tablet  08/26/18   [provider]  guaiFENesin-codeine 100-10 MG/5ML syrup Take 5 mLs by mouth every 4 (four) hours as needed. 05/26/22   Johnn Hai, PA-C  hydrOXYzine (VISTARIL) 25 MG capsule TAKE 1 CAPSULE BY MOUTH 2 (TWO) TIMES DAILY AS NEEDED. FOR SEVERE ANXIETY ATTACKS ONLY, SLEEP 11/01/18   Ursula Alert, MD  ipratropium-albuterol (DUONEB) 0.5-2.5 (3) MG/3ML SOLN Take 3 mLs by nebulization every 6 (six) hours as needed. 03/12/22   Merlyn Lot, MD  lubiprostone (AMITIZA) 24 MCG capsule Take by mouth. 04/06/08   [provider]  omeprazole (PRILOSEC) 20 MG capsule Take 1 capsule (20 mg total) by mouth daily. 09/08/20   Cuthriell, Charline Bills, PA-C  umeclidinium bromide (INCRUSE ELLIPTA) 62.5 MCG/INH AEPB  09/13/18   [provider]  ziprasidone (GEODON) 40 MG capsule TAKE 1 CAPSULE (40 MG TOTAL) BY MOUTH DAILY  WITH SUPPER. MOOD 11/01/18   Ursula Alert, MD    Lorin Picket ,PharmD Clinical Pharmacist  07/29/2022  5:43 PM

## 2022-07-29 NOTE — Progress Notes (Signed)
Responded to code stroke paged out. Pt was already taken to CT can follow if needs arise. Staff made aware of chaplain resources.

## 2022-07-29 NOTE — ED Provider Notes (Signed)
[facility] Provider Note    Event Date/Time   First MD Initiated Contact with Patient 07/29/22 1733     (approximate)   History   Chest Pain and Code Stroke   HPI  Kendra Clark is a 47 y.o. female   Past medical history of bipolar, hyperlipidemia, IBS, PTSD, and anxiety presents emergency department with stroke code.  She complains initially of mild cold symptoms for last few days with acute onset right-sided facial droop and left-sided chest pain started around noontime today.  She says the pain radiates to her back and down her left arm.  She has numbness in her left arm.  Patient was taken to the CT scanner for stroke code with a negative dry CT of the head with aspects 10.  I spoke with Dr. Su Monks on the telephone after the CT scan and after her initial stroke evaluation, and the plan is for CT angiogram of the head and neck as well as a medical workup for her chest pain including a CT angiogram chest abdomen pelvis dissection protocol and troponins, basic labs, infectious workup.      Physical Exam   Triage Vital Signs: ED Triage Vitals  Enc Vitals Group     BP --      Pulse Rate 07/29/22 1731 67     Resp 07/29/22 1731 18     Temp --      Temp src --      SpO2 07/29/22 1731 93 %     Weight 07/29/22 1725 298 lb 15.1 oz (135.6 kg)     Height 07/29/22 1725 5' 5"$  (1.651 m)     Head Circumference --      Peak Flow --      Pain Score --      Pain Loc --      Pain Edu? --      Excl. in [place]? --     Most recent vital signs: Vitals:   07/29/22 2000 07/29/22 2030  BP: (!) 98/57 117/66  Pulse: 69 79  Resp: (!) 22 20  Temp:    SpO2: 98% 96%    General: Awake, no distress.  CV:  Good peripheral perfusion.  Resp:  Normal effort.  Abd:  No distention.  Other:  Awake alert oriented comfortable.  She does have a right-sided facial droop.  She has some numbness with palpation of the left upper extremity.  No other acute neurologic  deficits noted.  Radial pulses intact and equal bilateral wrists.  Heart sounds normal lungs clear.  Nontender abd   ED Results / Procedures / Treatments   Labs (all labs ordered are listed, but only abnormal results are displayed) Labs Reviewed  DIFFERENTIAL - Abnormal; Notable for the following components:      Result Value   Lymphs Abs 4.9 (*)    All other components within normal limits  RESP PANEL BY RT-PCR (RSV, FLU A&B, COVID)  RVPGX2  PROTIME-INR  APTT  CBC  COMPREHENSIVE METABOLIC PANEL  ETHANOL  CBG MONITORING, ED  POC URINE PREG, ED  TROPONIN I (HIGH SENSITIVITY)  TROPONIN I (HIGH SENSITIVITY)     I ordered and reviewed the above labs they are notable for lecture lites are within normal limits, normal white blood cell count and negative initial troponin  EKG  ED ECG REPORT I, Lucillie Garfinkel, the attending physician, personally viewed and interpreted this ECG.   Date: 07/29/2022  EKG Time: 1801  Rate:  66  Rhythm: nsr  Axis: nl  Intervals:none  ST&T Change: No acute ischemic changes    RADIOLOGY I independently reviewed and interpreted CT of the head without contrast see no obvious bleeding or midline shift   PROCEDURES:  Critical Care performed: No  Procedures   MEDICATIONS ORDERED IN ED: Medications  aspirin tablet 325 mg (325 mg Oral Given 07/29/22 1948)  sodium chloride flush (NS) 0.9 % injection 3 mL (3 mLs Intravenous Given 07/29/22 1745)  iohexol (OMNIPAQUE) 350 MG/ML injection 100 mL (100 mLs Intravenous Contrast Given 07/29/22 1850)  LORazepam (ATIVAN) injection 1 mg (1 mg Intravenous Given 07/29/22 2057)  ketorolac (TORADOL) 15 MG/ML injection 15 mg (15 mg Intravenous Given 07/29/22 2007)    External physician / consultants:  I spoke with Dr. Su Monks of neurology regarding care plan for this patient.   IMPRESSION / MDM / ASSESSMENT AND PLAN / ED COURSE  I reviewed the triage vital signs and the nursing notes.                                 Patient's presentation is most consistent with acute presentation with potential threat to life or bodily function.  Differential diagnosis includes, but is not limited to, CVA, stroke intracranial bleed, ACS, dissection, PE, electrolyte derangement, Bell's palsy,    The patient is on the cardiac monitor to evaluate for evidence of arrhythmia and/or significant heart rate changes.  MDM: Stroke code neurologic symptoms manage in consultation with Dr. Quinn Axe of neurology, CT head negative, follow-up with CT angiogram given neurologic symptoms.  Given her chest pain with neurologic symptoms consider dissection will run the CT angiogram through the chest abdomen pelvis to evaluate for dissection as well as get EKG and troponins to assess for ACS.  EKG is nonischemic.  Medical workup for chest pain ongoing as above.  Low clinical suspicion for PE.  Consultation with Dr. Quinn Axe of neurology for neurologic workup, see her note.  Workup was negative and serial troponins flat.  Patient has been stable.  She continues to have right-sided facial droop both upper and lower, consistent with Bell's palsy.  Prescription for prednisone, acyclovir, artificial tears, advised for eye protection.  I considered hospitalization for admission or observation given flat troponins and negative neurologic workup for CVA as above, I think outpatient follow-up and discharge at this time most appropriate.           FINAL CLINICAL IMPRESSION(S) / ED DIAGNOSES   Final diagnoses:  Facial weakness  Bell's palsy  Chest pain, atypical     Rx / DC Orders   ED Discharge Orders          Ordered    predniSONE (DELTASONE) 50 MG tablet        07/29/22 2233    acyclovir (ZOVIRAX) 400 MG tablet  5 times daily        07/29/22 2233    hydroxypropyl methylcellulose / hypromellose (ISOPTO TEARS / GONIOVISC) 2.5 % ophthalmic solution  4 times daily        07/29/22 2233             Note:  This document was  prepared using Dragon voice recognition software and may include unintentional dictation errors.    Lucillie Garfinkel, MD 07/29/22 2234

## 2022-07-29 NOTE — ED Notes (Signed)
Patient transported to MRI 

## 2022-07-29 NOTE — ED Notes (Signed)
MRI stated it was ok to give the ativan now

## 2022-07-29 NOTE — ED Notes (Signed)
Patient transported back from CT 

## 2022-07-29 NOTE — ED Triage Notes (Signed)
Pt presents to the ED via POV due to CP that started today. Pt states the pain radiates to her back and down her L arm. Pt states 1345 she noticed a R side facial droop.

## 2022-07-29 NOTE — ED Notes (Signed)
Patient transported back from MRI. 

## 2022-07-29 NOTE — Discharge Instructions (Signed)
Follow up with your cardiologist regarding your chest pain.  Take prednisone and acyclovir as prescribed for 7 days.  Use eyedrops to keep the right eye moist throughout the day.  Tape your eye shut at night to make sure it is fully closed to keep the eye from drying out.  Thank you for choosing [place] for your health care today!  Please see your primary doctor this week for a follow up appointment.   Sometimes, in the early stages of certain disease courses it is difficult to detect in the emergency department evaluation -- so, it is important that you continue to monitor your symptoms and call your doctor right away or return to the emergency department if you develop any new or worsening symptoms.  Please go to the following website to schedule new (and existing) patient appointments:   [email or web address]  If you do not have a primary doctor try calling the following clinics to establish care:  If you have insurance:  [facility] [phone] [place] [address]   [facility]  [phone] [address] [place] [address]   If you do not have insurance:  [facility]  [phone] [place] [address]   The following is another list of primary care offices in the area who are accepting new patients at this time.  Please reach out to one of them directly and let them know you would like to schedule an appointment to follow up on an Emergency Department visit, and/or to establish a new primary care provider (PCP).  There are likely other primary care clinics in the are who are accepting new patients, but this is an excellent place to start:  [place] physician: Dr Lavon Paganini [address] [place], [place] [address] [phone]  [facility] Lead Physician: Dr Steele Sizer [address], [place], [place]  [address] [phone]  [place] Physician: Dr Park Liter [address] [place], [place] [address] [phone]  [facility] Lead Physician: Dr Dewaine Oats [place], [place], [place] [address] [phone]  [facility] [place] Physician: Dr Halina Maidens [address] [place],  [address] [phone]   It was my pleasure to care for you today.   Ho[place] Brunette Jacelyn Grip, MD

## 2022-07-29 NOTE — Consult Note (Signed)
NEUROLOGY TELECONSULTATION NOTE   Date of service: July 29, 2022 Patient Name: Kendra Clark MRN:  [id number] DOB:  11/24/1975 Reason for consult: telestroke  Requesting Provider: Lucillie Garfinkel Consult Participants: myself, patient, bedside RN, telestroke RN Location of the provider: [place], [place] Location of the patient: [facility]  This consult was provided via telemedicine with 2-way video and audio communication. The patient/family was informed that care would be provided in this way and agreed to receive care in this manner.   _ _ _   _ __   _ __ _ _  __ __   _ __   __ _  History of Present Illness   This is a 47 yo woman with hx bipolar, PTSD, HL, mitral valve disease who presents with chief complaint of 9/10 chest pain radiating to her back and down her L arm. Accompanied by this she reports a squeezing sensation in her head (not painful) and a R facial droop with slight dysarthria. LKW 1330. She has a history of migraines but typically they occur with frank head pain and without neurologic sx. Exam remarkable for R facial droop and slurred speech. Initially she had no movement against gravity in LUE but with extensive coaching she was able to hold up all extremities with no drift. No other deficits. NIHSS = 2. Personal review of head CT showed no acute process. TNK not administered 2/2 mild symptoms and low suspicion for stroke (favor migraine or process related to her chief complaint of chest pain which would not be explained by stroke). CTA not performed 2/2 exam not c/w LVO.    ROS   Per HPI; all other systems reviewed and are negative  Past History   The following was personally reviewed:  Past Medical History:  Diagnosis Date   Anxiety    Asthma    Atopy    Bipolar disorder ([place])    Degenerative disc disease, lumbar    Depression    Hiatal hernia    History of mitral valve disease    Hyperlipidemia    IBS (irritable bowel syndrome)    Osteoporosis    PTSD  (post-traumatic stress disorder)    Past Surgical History:  Procedure Laterality Date   ABDOMINAL HYSTERECTOMY     ABDOMINAL HYSTERECTOMY     CESAREAN SECTION     x 3    GALLBLADDER SURGERY     TONSILLECTOMY AND ADENOIDECTOMY     Family History  Problem Relation Age of Onset   Hypertension Father    Hyperlipidemia Father    Hypertension Mother    Hyperlipidemia Mother    Arrhythmia Mother        A-fib   Breast cancer Mother 74   Breast cancer Paternal Aunt 83   Breast cancer Paternal Grandmother 38   Social History   Socioeconomic History   Marital status: Single    Spouse name: Not on file   Number of children: Not on file   Years of education: Not on file   Highest education level: Not on file  Occupational History   Not on file  Tobacco Use   Smoking status: Every Day    Packs/day: 1.50    Years: 13.00    Total pack years: 19.50    Types: Cigarettes   Smokeless tobacco: Never  Vaping Use   Vaping Use: Never used  Substance and Sexual Activity   Alcohol use: No   Drug use: Yes    Types: Marijuana  Sexual activity: Not Currently  Other Topics Concern   Not on file  Social History Narrative   Not on file   Social Determinants of Health   Financial Resource Strain: Not on file  Food Insecurity: Not on file  Transportation Needs: Not on file  Physical Activity: Not on file  Stress: Not on file  Social Connections: Not on file   Allergies  Allergen Reactions   Penicillins Shortness Of Breath, Itching and Swelling    Has patient had a PCN reaction causing immediate rash, facial/tongue/throat swelling, SOB or lightheadedness with hypotension: No Has patient had a PCN reaction causing severe rash involving mucus membranes or skin necrosis: No Has patient had a PCN reaction that required hospitalization: No Has patient had a PCN reaction occurring within the last 10 years: No If all of the above answers are "NO", then may proceed with Cephalosporin use.    Tramadol     Breathing difficulty  Blurred vision Chest pain    Medications   (Not in a hospital admission)     Current Facility-Administered Medications:    sodium chloride flush (NS) 0.9 % injection 3 mL, 3 mL, Intravenous, Once, Lucillie Garfinkel, MD  Current Outpatient Medications:    azelastine (OPTIVAR) 0.05 % ophthalmic solution, INSTILL 1 DROP INTO BOTH EYES TWICE A DAY FOR 10 DAYS AS NEEDED, Disp: , Rfl:    diclofenac (VOLTAREN) 75 MG EC tablet, Take 75 mg by mouth 2 (two) times daily., Disp: , Rfl:    esomeprazole (NEXIUM) 40 MG capsule, Take by mouth., Disp: , Rfl:    furosemide (LASIX) 20 MG tablet, , Disp: , Rfl:    guaiFENesin-codeine 100-10 MG/5ML syrup, Take 5 mLs by mouth every 4 (four) hours as needed., Disp: 120 mL, Rfl: 0   hydrOXYzine (VISTARIL) 25 MG capsule, TAKE 1 CAPSULE BY MOUTH 2 (TWO) TIMES DAILY AS NEEDED. FOR SEVERE ANXIETY ATTACKS ONLY, SLEEP, Disp: 180 capsule, Rfl: 1   ipratropium-albuterol (DUONEB) 0.5-2.5 (3) MG/3ML SOLN, Take 3 mLs by nebulization every 6 (six) hours as needed., Disp: 360 mL, Rfl: 1   lubiprostone (AMITIZA) 24 MCG capsule, Take by mouth., Disp: , Rfl:    omeprazole (PRILOSEC) 20 MG capsule, Take 1 capsule (20 mg total) by mouth daily., Disp: 30 capsule, Rfl: 6   umeclidinium bromide (INCRUSE ELLIPTA) 62.5 MCG/INH AEPB, , Disp: , Rfl:    ziprasidone (GEODON) 40 MG capsule, TAKE 1 CAPSULE (40 MG TOTAL) BY MOUTH DAILY WITH SUPPER. MOOD, Disp: 90 capsule, Rfl: 1  Vitals   Vitals:   07/29/22 1725  Weight: 135.6 kg  Height: 5' 5"$  (1.651 m)     Body mass index is 49.75 kg/m.  Physical Exam   Exam performed over telemedicine with 2-way video and audio communication and with assistance of bedside RN  Physical Exam Gen: A&O x4, NAD Resp: normal WOB CV: extremities appear well-perfused  Neuro: *MS: A&O x4. Follows multi-step commands.  *Speech: mildly dysarthric, no aphasia, able to name and repeat *CN: PERRL 86m, EOMI, VFF by  confrontation, sensation intact, R UMN facial droop, hearing intact to voice *Motor:   Normal bulk.  No tremor, rigidity or bradykinesia. No pronator drift. Initially no movement against gravity LUE but with extensive coaching was able to hold up all extremities without drift. *Sensory: SILT. Symmetric. No double-simultaneous extinction.  *Coordination:  Finger-to-nose, heel-to-shin, rapid alternating motions were intact. *Reflexes:  UTA 2/2 tele-exam *Gait: deferred  NIHSS = 2 for facial droop and dysarthria   Premorbid  mRS = 0   Labs   CBC: No results for input(s): "WBC", "NEUTROABS", "HGB", "HCT", "MCV", "PLT" in the last 168 hours.  Basic Metabolic Panel:  Lab Results  Component Value Date   NA 142 03/12/2022   K 3.5 03/12/2022   CO2 27 03/12/2022   GLUCOSE 90 03/12/2022   BUN 14 03/12/2022   CREATININE 0.90 03/12/2022   CALCIUM 9.3 03/12/2022   GFRNONAA >60 03/12/2022   GFRAA >60 12/08/2019   Lipid Panel: No results found for: "LDLCALC" HgbA1c: No results found for: "HGBA1C" Urine Drug Screen: No results found for: "LABOPIA", "COCAINSCRNUR", "LABBENZ", "AMPHETMU", "THCU", "LABBARB"  Alcohol Level No results found for: "ETH"   Impression   This is a 47 yo woman with hx bipolar, PTSD, HL, mitral valve disease, migraines who presents with chief complaint of 9/10 chest pain radiating to her back and down her L arm. Accompanied by this she reports a squeezing sensation in her head (not painful) and a R facial droop with slight dysarthria. Exam has some functional features (initially she had no movement against gravity in LUE but with extensive coaching she was able to hold up all extremities with no drift). No other deficits. NIHSS = 2. Personal review of head CT showed no acute process. TNK not administered 2/2 mild symptoms and low suspicion for stroke (favor migraine or process related to her chief complaint of chest pain which would not be explained by stroke). CTA not  performed 2/2 exam not c/w LVO.   Recommendations   - Recommend further workup of CP per ED to include CTA chest dissection protocol. Please include CTA H&N given her (mild) accompanying neurologic sx - If no dissection, give ASA 340m once - After emergent workup completed, recommend brain MRI wo contrast. If neg for ischemia, no indication for further stroke or other in-hospital neurologic workup and can discontinue aspirin at that time unless she has another indication for it ______________________________________________________________________   Thank you for the opportunity to take part in the care of this patient. If you have any further questions, please contact the neurology consultation attending.  Signed,  CSu Monks MD [facility] [phone] If 7pm- 7am, please page neurology on call as listed in [place]  **Any copied and pasted documentation in this note was written by me in another application not billed for and pasted by me into this document.

## 2022-07-29 NOTE — Progress Notes (Addendum)
Telestroke RN note: Code stroke cart activated at 1721. Patient already in CT. Reported LKW 1345. MRS 0. Patient with new right facial droop. Neurologist paged at 1723. Dr. Quinn Axe connected to telestroke cart at 1724. Patient returned to room from CT at 1728 via wheelchair. Exam completed. Dr. Quinn Axe has low suspicion for stroke. No TNK at that time. Dr. Quinn Axe and telestroke RN disconnected from cart at 1739. Dr. Quinn Axe to call and update EDP with recommendations.

## 2022-07-30 MED FILL — REPATHA SURECLICK 140 MG/ML SUBCUTANEOUS PEN INJECTOR: SUBCUTANEOUS | 84 days supply | Qty: 6 | Fill #1

## 2022-08-07 ENCOUNTER — Ambulatory Visit: Admit: 2022-08-07 | Discharge: 2022-08-08 | Payer: MEDICARE | Attending: Adult Health | Primary: Adult Health

## 2022-08-07 DIAGNOSIS — F4310 Post-traumatic stress disorder, unspecified: Principal | ICD-10-CM

## 2022-08-07 DIAGNOSIS — F331 Major depressive disorder, recurrent, moderate: Principal | ICD-10-CM

## 2022-08-07 DIAGNOSIS — I2584 Coronary atherosclerosis due to calcified coronary lesion: Principal | ICD-10-CM

## 2022-08-07 DIAGNOSIS — F419 Anxiety disorder, unspecified: Principal | ICD-10-CM

## 2022-08-07 DIAGNOSIS — I2510 Atherosclerotic heart disease of native coronary artery without angina pectoris: Principal | ICD-10-CM

## 2022-08-07 DIAGNOSIS — F17210 Nicotine dependence, cigarettes, uncomplicated: Principal | ICD-10-CM

## 2022-08-07 MED ORDER — BUPROPION HCL XL 150 MG 24 HR TABLET, EXTENDED RELEASE
ORAL_TABLET | Freq: Every day | ORAL | 3 refills | 90 days | Status: CP
Start: 2022-08-07 — End: 2023-08-07

## 2022-08-07 NOTE — Unmapped (Signed)
DIVISION OF CARDIOLOGY   [facility], [place]                                                                         Date of Service:  08/07/2022     Assessment/Plan     1. Coronary artery calcification  PET perfusion study showed coronary artery calcifications even though there is no ischemic burden. Strong FHx of CVD. No anginal symptoms   - continue ASA, statin, repatha  - restart regular exercise, wt loss    2. Hyperlipidemia, unspecified hyperlipidemia type  Prior intolerance to higher intensity statins.   On atorva 10 + repatha w/ excellent lipid control (LDL 162>>58). Tolerating well.   Recheck lipids next visit    Lab Results   Component Value Date    LDL 58 08/06/2021     3. TIA  08/06/21 ED visit for likely TIA.   Recent ED visit for same sx  Continue ASA, statin  Smoking cessation    4. Tobacco abuse  Extensive counseling today  Start wellbutrin 1 wk prior to quitting  Continue for at least 12 weeks if successful    3. Anxiety  4. PTSD (post-traumatic stress disorder)  5. MDD (major depressive disorder), recurrent episode, moderate (CMS-HCC)  Starting wellbutrin as above  Needs to e/w therapist  - Ambulatory referral to [facility]; Future      Return to clinic:  Return in about 6 weeks (around 09/18/2022).    I personally spent 34 minutes face-to-face and non-face-to-face in the care of this patient, which includes all pre, intra, and post visit time on the date of service.     Return in about 6 weeks (around 09/18/2022).    Subjective:   [id number], Alyssa Rose, FNP  Chief complaint:  47 y.o. female with a history of coronary calcium, HLD, GERD, obesity, PTSD, TIA, depression and anxiety  is referred by [name], FNP for f/up    History of Present Illness:  Patient has been seen in our clinic starting in April 2022 for chest pain.  Echo and PET stress test were both unremarkable.  Coronary  calcifications were noted and she subsequently been started on aspirin, Repatha and low-dose atorvastatin (history of statin intolerance).  She is also had work-up of palpitations but Zio patch showed no significant arrhythmias, just 1 brief SVT. She was seen in ED for likely TIA.   I last saw her in August 2023 at which time she was overall doing well but had relapsed smoking.     Today Ms. Alyssa Herrera presents for routine follow-up.  She denies any chest pain, shortness of breath, palpitations.   She now has custody of her 36-year-old granddaughter who is experienced significant trauma.     Seen in [facility] ED  earlier this month for facial weakness, chest pain pain radiating to b/l neck down to back and L arm, vision went in & out, facial droop/tingling. Head felt full and chest hurt - evaluated for stroke. Head CT negative - told bell's palsy. R/o for ACS. Symptoms self resolved. Was put on steroid and abx.   Since then, has been doing ok - dealing with a lot of anxiety. Not having any more  chest pain. Will randomly have neck/shoulder pain.     Continues to smoke.      Has a lot of anxiety, sometimes has panic attacks. Currently not on SSRI. Previously saw therapist but not currently.     Mother had heart attack at age of 13, with history of atrial fibrillation. Sister had a heart attack at 72, and history of strokes. Lives with 52 year old granddaughter. Has two children.     Past Medical History  Patient Active Problem List   Diagnosis    Anxiety    GERD (gastroesophageal reflux disease)    Obesity    Vitamin D deficiency    Morbid (severe) obesity due to excess calories (CMS-HCC)    Moderate persistent asthma without complication    Asthma    Arthralgia    Coronary artery calcification    Degeneration of intervertebral disc    Hyperlipidemia    Irritable bowel syndrome    Low back pain    Mitral valve disorder    Nicotine dependence, uncomplicated    Other seasonal allergic rhinitis    Other chronic pain    TIA (transient ischemic attack)    MDD (major depressive disorder), recurrent episode, moderate (CMS-HCC)    PTSD (post-traumatic stress disorder)       Medications:  Current Outpatient Medications   Medication Sig Dispense Refill    acetaminophen (TYLENOL ARTHRITIS ORAL) Take by mouth daily as needed.      albuterol 2.5 mg /3 mL (0.083 %) nebulizer solution Inhale 3 mL (2.5 mg total) by nebulization four (4) times a day. (Patient taking differently: Inhale 3 mL (2.5 mg total) by nebulization four (4) times a day. PRN) 100 mL 3    albuterol HFA 90 mcg/actuation inhaler INHALE 2 PUFFS BY MOUTH EVERY 6 HOURS AS NEEDED FOR WHEEZE 18 g 5    aspirin (ECOTRIN) 81 MG tablet Take 1 tablet (81 mg total) by mouth daily.      atorvastatin (LIPITOR) 10 MG tablet Take 1 tablet (10 mg total) by mouth daily. 90 tablet 3    blood-glucose meter kit Use as instructed 1 each 0    empty container (SHARPS-A-GATOR DISPOSAL SYSTEM) Misc Use as directed for sharps disposal 1 each 2    ergocalciferol-1,250 mcg, 50,000 unit, (DRISDOL) 1,250 mcg (50,000 unit) capsule 1 capsule weekly for 12 weeks 12 capsule 2    evolocumab 140 mg/mL PnIj Inject the contents of one pen (140 mg) under the skin every fourteen (14) days. 6 mL 3    fluticasone propionate (FLOVENT HFA) 110 mcg/actuation inhaler Inhale 2 puffs Two (2) times a day. As needed for cough 12 g 2    furosemide (LASIX) 20 MG tablet Take 1 tablet (20 mg total) by mouth Two (2) times a day. 180 tablet 1    ipratropium-albuterol (DUO-NEB) 0.5-2.5 mg/3 mL nebulizer Inhale 3 mL every six (6) hours as needed. PRN      linaCLOtide (LINZESS) 145 mcg capsule Take 1 capsule (145 mcg total) by mouth daily. 90 capsule 1    MOUNJARO 5 mg/0.5 mL PnIj INJECT 5 MILLIGRAMS SUBCUTANEOUSLY ONCE A WEEK      multivitamin (TAB-A-VITE/THERAGRAN) per tablet Take 1 tablet by mouth daily.      omeprazole (PRILOSEC) 20 MG capsule TAKE 1 CAPSULE BY MOUTH EVERY DAY 90 capsule 0    tizanidine (ZANAFLEX) 4 MG tablet Take 1 tablet (4 mg total) by mouth every six (6) hours as needed. 120 tablet 1 buPROPion [facility]  XL) 150 MG 24 hr tablet Take 1 tablet (150 mg total) by mouth daily. 90 tablet 3    naltrexone 4.5 mg capsule Take by mouth daily. 30 capsule 0     No current facility-administered medications for this visit.       Allergies  Allergies   Allergen Reactions    Penicillins Hives and Shortness Of Breath     Can take amoxicillian    Tramadol Hives and Shortness Of Breath     Breathing difficulty   Blurred vision  Chest pain    Gabapentin Other (See Comments)     Seizure      Pravastatin Muscle Pain    Simvastatin Muscle Pain       Social History:   Social History     Tobacco Use    Smoking status: Some Days     Current packs/day: 0.25     Average packs/day: 0.3 packs/day for 15.0 years (3.8 ttl pk-yrs)     Types: Cigarettes     Passive exposure: Never    Smokeless tobacco: Never    Tobacco comments:     Working on [employer] - 1PPD   Vaping Use    Vaping status: Never Used   Substance Use Topics    Alcohol use: No    Drug use: Yes     Types: Marijuana     Comment: 1-3x monthly        Family History:  Family History   Problem Relation Age of Onset    Colorectal Cancer Father     Arthritis Father     Diabetes Father     Drug abuse Father     Heart disease Father     Hypertension Father     Stroke Father     Clotting disorder Father     Ulcers Father     Colorectal Cancer Paternal Uncle     Ovarian cancer Mother     Arthritis Mother     Asthma Mother     Cancer Mother         Uteral    COPD Mother     Depression Mother     Diabetes Mother     Heart disease Mother     Hyperlipidemia Mother     Hypertension Mother     Kidney disease Mother     Mental illness Mother     Stroke Mother     Vision loss Mother         Due to massive heart attack    Clotting disorder Mother     Liver disease Mother     Ulcers Mother     Glaucoma Mother     Breast cancer Paternal Grandmother     Asthma Sister     Depression Sister     Diabetes Sister     Heart disease Sister     Hypertension Sister     Stroke Sister     Birth defects Sister         Prime    Clotting disorder Sister     Ulcers Sister     Cancer Maternal Grandmother         Breast    Early death Sister     Early death Brother     Learning disabilities Son     Learning disabilities Son     Aneurysm Paternal Grandfather        ROS- 12 system review is negative other than what is specified  in the History of Present Illness.      Objective:   Physical Exam  Vitals:    08/07/22 0846   BP: 126/84   BP Site: L Arm   BP Position: Sitting   BP Cuff Size: Medium   Pulse: 73   SpO2: 99%   Weight: (!) 137.3 kg (302 lb 12.8 oz)      General-  Obese appearing female in no apparent distress.  Neurologic- Alert and oriented X3.  Cranial nerve II-XII grossly intact.  HEENT-  Normocephalic atraumatic head.  No scleral icterus.  MMM  Neck- Supple, no carotid bruis, no JVD  Lungs- Clear to auscultation, no wheezes, rhonchi, or rhales.  Heart-  RRR, no MRG.  Extremities-  No clubbing or cyanosis.  No pitting edema to LE bilaterally.  Pulses- 2+ pulses in radial and dorsalis pedis bilaterally.  Psych- Normal mood, appropriate.    Laboratory data:    I have personally reviewed the images of the following diagnostic studies.      Electrocardiogram:  From 09/28/20 showed SR, borderline low voltage.    Echocardiogram:  From 06/2015 at [facility] showed LV systolic function and size were normal. Estimated EF 60-65%. Wall motion was normal; there were no regional wall motion abnormalities. Left ventricular diastolic function parameters were normal. LA normal in size. RV systolic function was normal. Pulmonary artery systolic pressure was within the normal range.     From 09/21/20 showed LV upper normal in size with normal wall thickness, LVEF 55%, normal RV systolic function, no significant valvular abnormalities.    Nuclear stress test:  PET CT stress test showed 10/11/20 showed normal myocardial perfusion study. No evidence of any significant ischemia or scar. Left ventricular systolic function is normal. Post stress the ejection fraction is > 60%. Minimal coronary calcifications are noted. Status post cholecystectomy.    Cardiac monitor:  From 10/16/20 showed patient had a min HR of 48 bpm, max HR of 169 bpm, and avg HR of 80 bpm. Predominant underlying rhythm was Sinus Rhythm. 1 run of Supraventricular Tachycardia occurred lasting 4 beats with a max rate of 169 bpm (avg 150 bpm). Isolated SVEs were rare (<1.0%), SVE Couplets were rare (<1.0%), and SVE Triplets were rare (<1.0%). Isolated VEs were rare (<1.0%), VE Couplets were rare (<1.0%), and no VE Triplets were Present. There were 55 patient triggered events.  49 of the episodes corresponded to normal sinus rhythm.  6 episodes corresponded to isolated PVCs.      Glade Stanford, AGNP-C  Cardiology Nurse Practitioner  [facility]

## 2022-08-07 NOTE — Unmapped (Addendum)
Start wellbutrin    Quit date should be at least 1 week after starting wellbutrin    Continue for at least 12 weeks if successful    Www.psychologytoday.com  Referral to Behavioral Health    Get lipids checked next viist    SMOKING CESSATION:  [facility] provides free cessation services to any [place] resident who needs help quitting tobacco use. Quit Coaching is available in different forms, which can be used separately or together to help any tobacco user give up tobacco.   Call or go online to take advantage of this free smoking cessation program.  WWW.QUITLINENC.COM   1-800-QUIT-NOW 587 037 7214)

## 2022-09-06 IMAGING — US US EXTREM LOW VENOUS*R*
1 series · 14 of 24 positions shown · non-contrast
Comparison: None available

CLINICAL DATA: A 46-year-old female presents for evaluation of leg
pain post fall.

EXAM:
RIGHT LOWER EXTREMITY VENOUS DOPPLER ULTRASOUND
TECHNIQUE: Gray-scale sonography with compression, as well as color and duplex
ultrasound, were performed to evaluate the deep venous system(s)
from the level of the common femoral vein through the popliteal and
proximal calf veins.

[Series 1: us venous img lower uni right (dvt) · portal-venous · 14 of 33 slices shown]
[im 1/33]
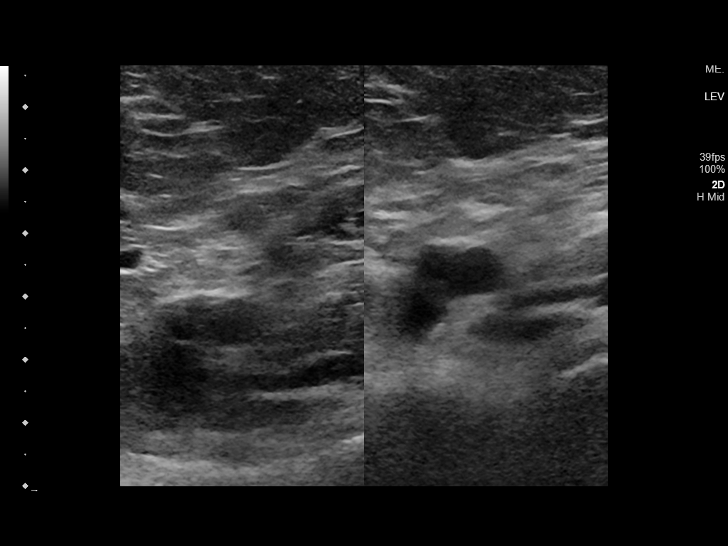
[im 3/33]
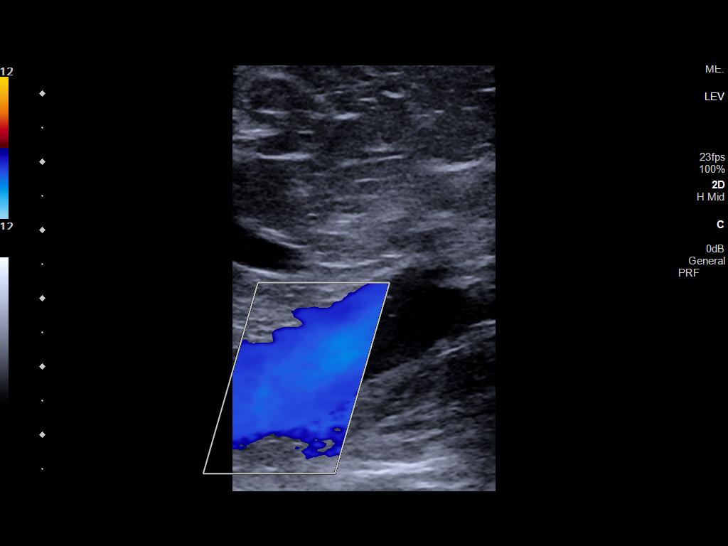
[im 6/33]
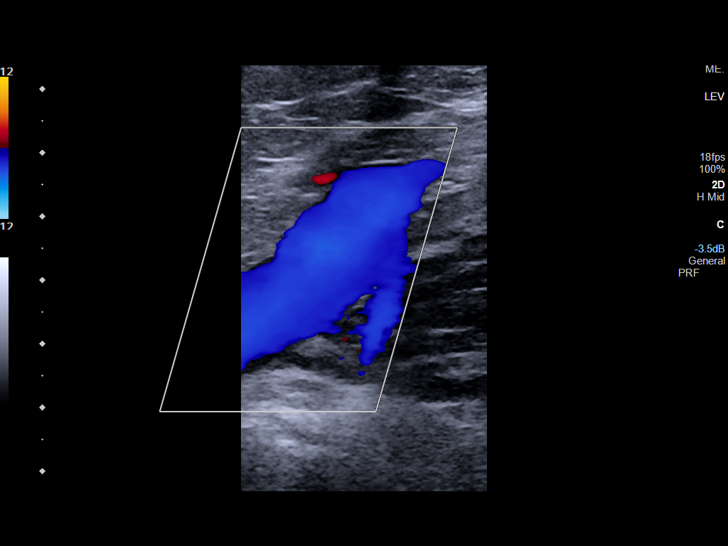
[im 9/33]
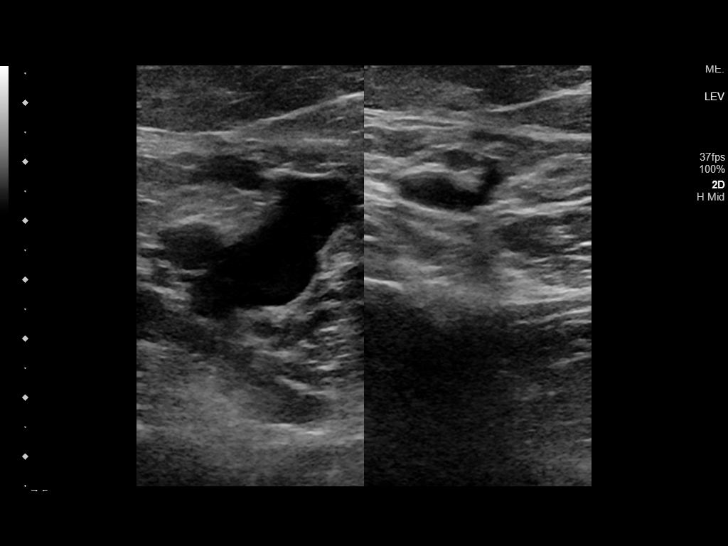
[im 10/33]
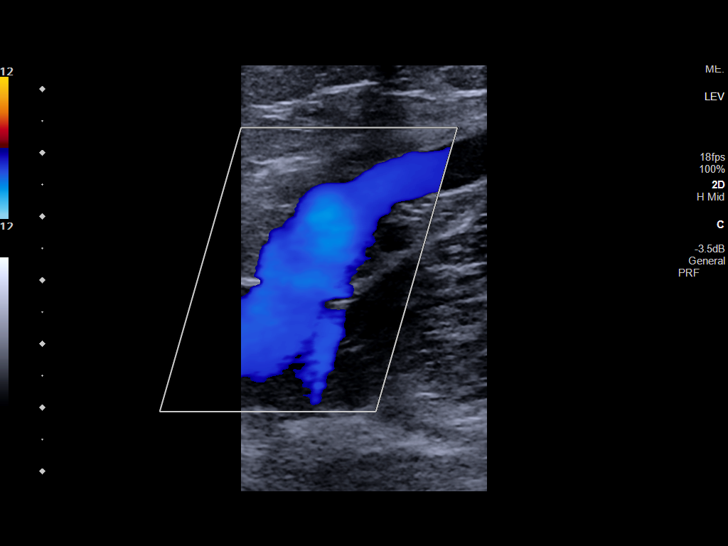
[im 13/33]
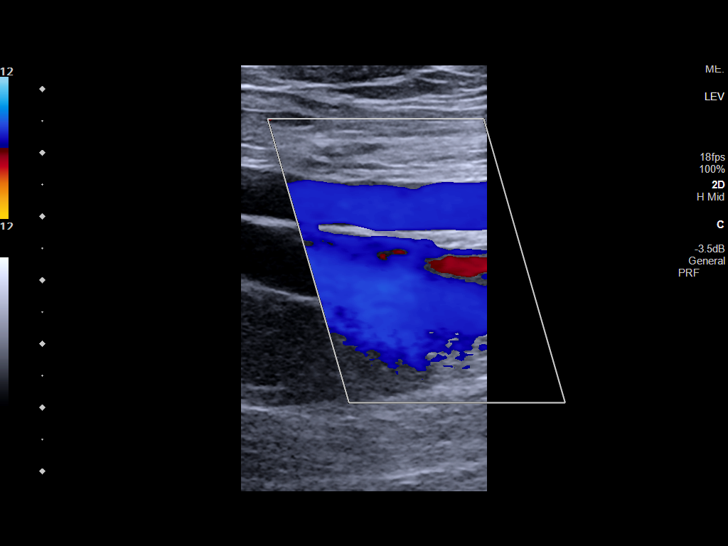
[im 16/33]
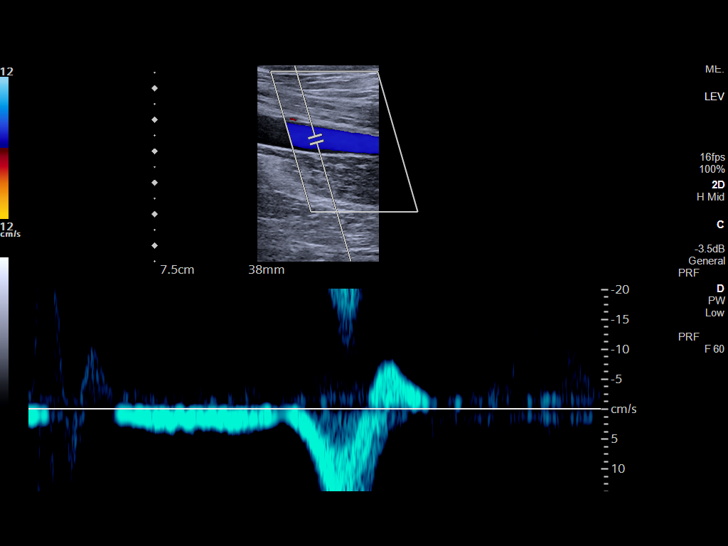
[im 17/33]
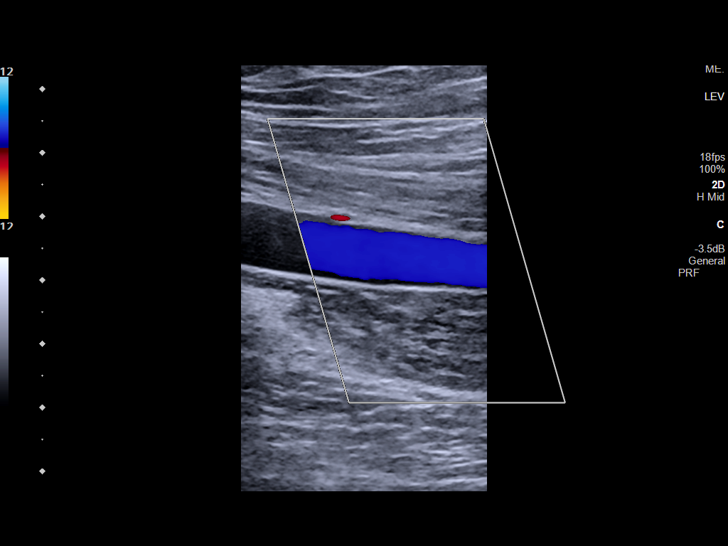
[im 20/33]
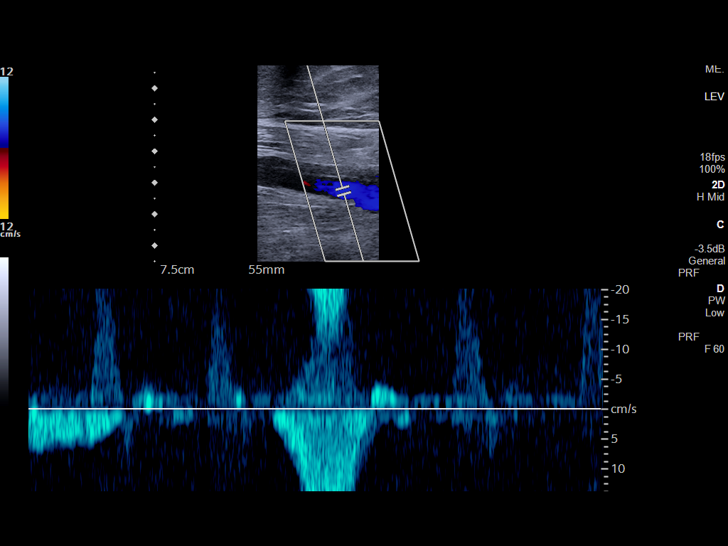
[im 23/33]
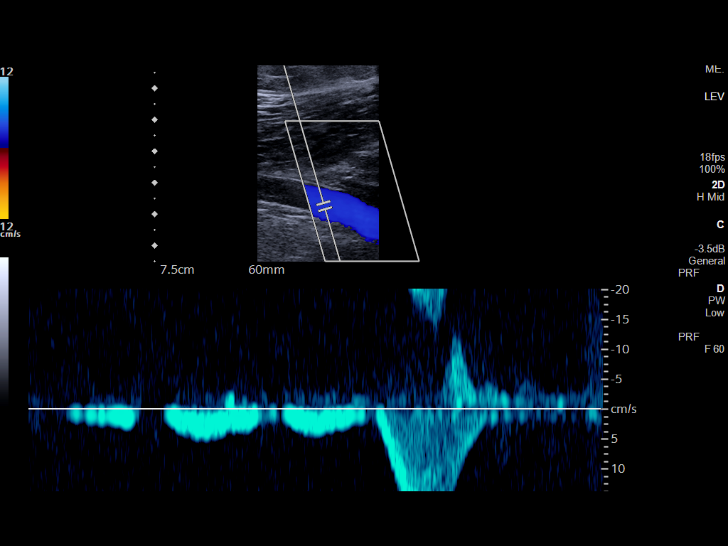
[im 26/33]
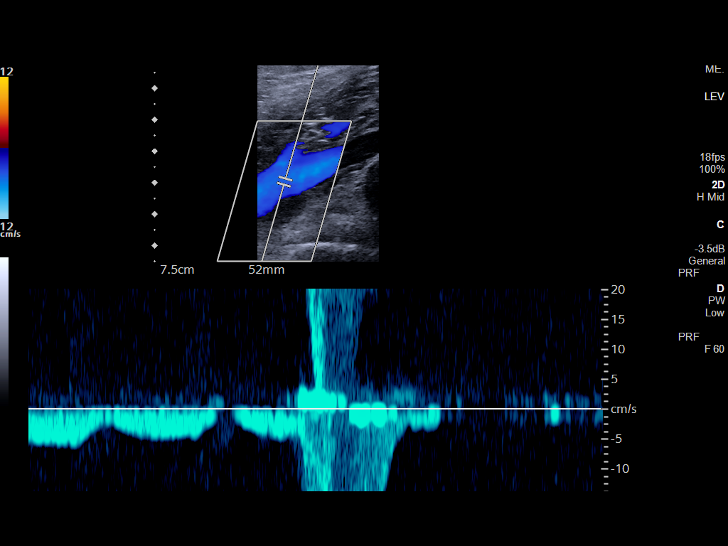
[im 27/33]
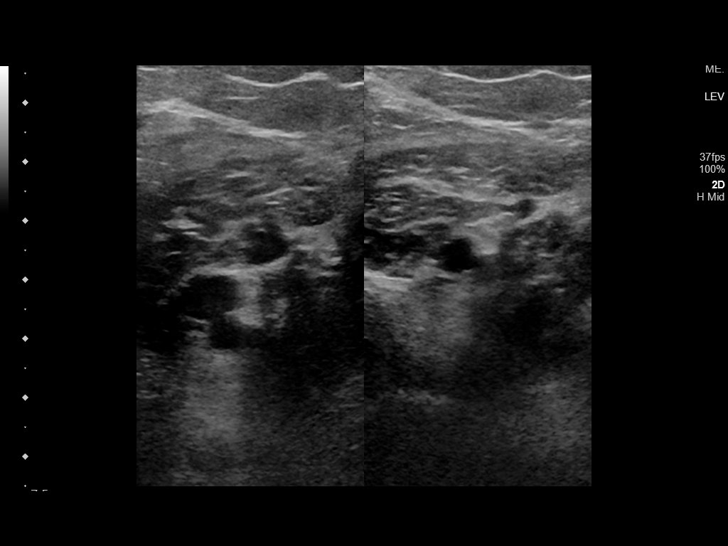
[im 30/33]
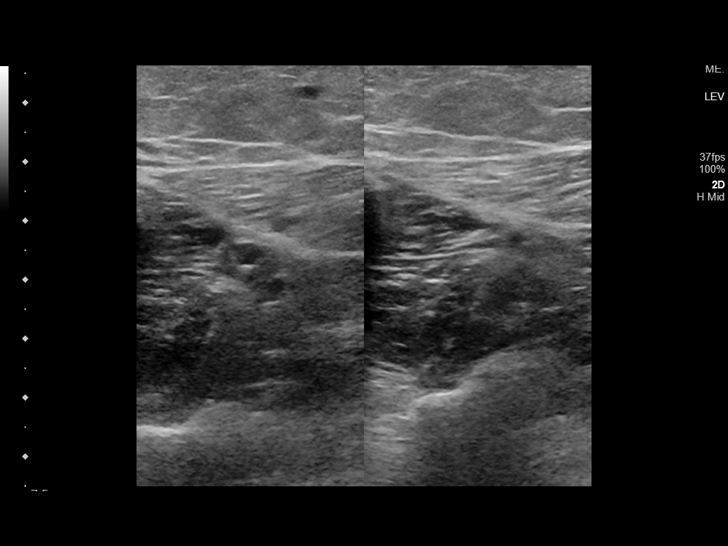
[im 33/33]
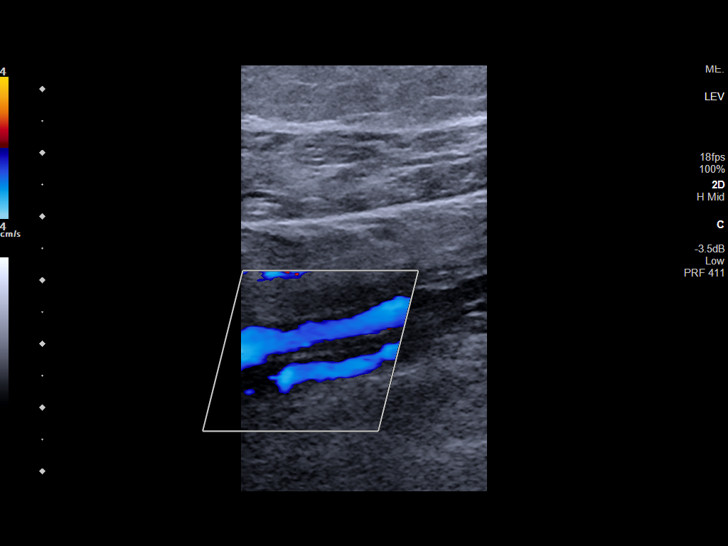

[14 of 24 positions shown; findings below may reference images not displayed]

FINDINGS: VENOUS

Normal compressibility of the common femoral, superficial femoral,
and popliteal veins, as well as the visualized calf veins.
Visualized portions of profunda femoral vein and great saphenous
vein unremarkable. No filling defects to suggest DVT on grayscale or
color Doppler imaging. Doppler waveforms show normal direction of
venous flow, normal respiratory plasticity and response to
augmentation.

Limited views of the contralateral common femoral vein are
unremarkable.

OTHER

None.

Limitations: none
IMPRESSION: No sonographic evidence of RIGHT lower extremity DVT.

## 2022-09-22 MED ORDER — TIZANIDINE 4 MG TABLET
ORAL_TABLET | Freq: Four times a day (QID) | ORAL | 1 refills | 30 days | PRN
Start: 2022-09-22 — End: 2022-11-21

## 2022-09-22 NOTE — Unmapped (Signed)
Refill request received for patient.      Medication Requested: Tizanidine 4 mg Tablet  Last Office Visit: 07/23/2022   Next Office Visit: Visit date not found  Last Prescriber: Doylene Canard     Please refill if appropriate   90 day supply request

## 2022-09-23 MED ORDER — TIZANIDINE 4 MG TABLET
ORAL_TABLET | Freq: Four times a day (QID) | ORAL | 1 refills | 30 days | PRN
Start: 2022-09-23 — End: 2022-11-22

## 2022-09-25 MED ORDER — TIZANIDINE 4 MG TABLET
ORAL_TABLET | Freq: Four times a day (QID) | ORAL | 2 refills | 30.00000 days | Status: CP | PRN
Start: 2022-09-25 — End: 2022-09-25

## 2022-09-25 NOTE — Unmapped (Signed)
LM for pt to call and schedule appointment

## 2022-09-25 NOTE — Unmapped (Signed)
Addended by: Gilmore Laroche on: 09/25/2022 08:23 AM     Modules accepted: Orders

## 2022-09-25 NOTE — Unmapped (Signed)
-----   Message from Jenel Lucks, RN sent at 09/25/2022  8:06 AM EDT -----  Regarding: FW: 90-day refill request from [facility]  See message below from Dr. Vear Clock.  Prescription cannot be refilled without an appointment with the provider.  Please contact patient to schedule an appointment.    Thank you,  Darl Pikes  ----- Message -----  From: Leanor Kail, MD  Sent: 09/25/2022   7:50 AM EDT  To: Jenel Lucks, RN; Gilmore Laroche, MD  Subject: RE: 90-day refill request from [facility]      She needs a follow up appointment.  She cancelled her appointment with Asher Muir yesterday  ----- Message -----  From: Jenel Lucks, RN  Sent: 09/24/2022   2:23 PM EDT  To: Leanor Kail, MD; Gilmore Laroche, MD  Subject: 90-day refill request from [facility]          Received fax from [facility] requesting 90-day refill for patient's 4mg  Tizanidine  If appropriate, please consider a 90-day refill.    Thank you,  Darl Pikes

## 2022-10-07 NOTE — Unmapped (Unsigned)
Looks like she has appt with Gene tomorrow

## 2022-10-07 NOTE — Unmapped (Signed)
[facility] at [facility] Note:  [place], [place] [address]. Phone [phone]    10/09/2022    Patient Name:   Alyssa Herrera    MRN: [id number]    Demographics:    Age-  47 y.o.     Date of Birth-  [birth year]    Chief complaint (CC):    Chief Complaint   Patient presents with    Abdominal Pain     Bloating, burping, stomach pain for one day.   Patient also complaining of headache today.       Assessment/Plan:    Evetta is a pleasant 47 y.o. female with PMHx of IBS, Pancreatitis, peptic ulers, GERD, hiatal hernia, asthma, allergic rhinitis, COPD, TIA, mitral valve prolapse, Coronary artery calcification, aneurysm, cervical DDD, vitamin D deficiency, Seizures, chronic pain, PTSD, obesity, nicotine dependence, HLD, depression, anxiety. Primary concern today regarding x1 day of persistent bloating, belching, epigastric pain with new-onset headache this AM. PE today with hypoactive bowel sounds, question whether recent dose increase in Mounjaro (5 -> 7.5 mg weekly x3 weeks ago) is contributing to current symptoms. Advised thorough bowel clean out, transitioning from Linzess -> Miralax 17 g daily x1 week (Rx provided). Also suggested patient increase her omeprazole from 20 mg daily -> 20 mg BID dosing, Rx adjusted. Agree with plan to pursue liquid/low-FODMAP diet for next several days, relevant handout provided. ER precautions reviewed, follow up if symptoms fail to improve or worsen.      Diagnosis ICD-10-CM Associated Orders   1. Epigastric pain  R10.13 polyethylene glycol (MIRALAX) 17 gram packet     omeprazole (PRILOSEC) 20 MG capsule        30 minutes of clinical time > 1/2 the office visit was face to face time. We discussed medical, dietary, lifestyle, and health maintenance modifications to optimize health. Standard precautions followed during visit. Medication adherence and barriers to the treatment plan have been addressed.  Patient voiced understanding, needs F/U visit if symptoms fail to improve or worsen.     2.  Health Maintenance Review:   Health Maintenance Due   Topic Date Due    COVID-19 Vaccine (4 - 2023-24 season) 02/15/2023     Subjective:    History of present illness (HPI):       Alyssa Herrera is a 47 y.o. female who presented to [facility] for evaluation regarding abdominal pain. Patient reports x1 day of persistent bloating, belching with sulfurous odor, and stromach pain. Also notes new-onset headache earlier this AM. Patient's medical history was reviewed, see Past Medical History. Medications used in the past were reviewed, see medications. Increased her Mounjaro dose x3 weeks ago. Patient reports eating and eliminating well. Pt is sleeping well. Patient has not required emergency room treatment for these symptoms, and has not required hospitalization.     Denies HA, fever, chest pain, shortness of breath, emesis, dizziness, bowel or bladder issues, vision or hearing changes.     Relevant ROS: Reviewed 12 systems, positive findings listed, all others negative.    Pertinent Past Med Hx:    Past Medical History:   Diagnosis Date    ADD (attention deficit disorder)     Aneurysm (CMS-HCC)     cerebral    Anxiety     Asthma     At risk for falls     off balance, arthritis (hips, knees, feet) - last fall 02/2020    Borderline diabetes     Caregiver burden  5 yo granddaughter     Cerebrovascular disease     Constipation     COPD (chronic obstructive pulmonary disease) (CMS-HCC) 08/05/2010    DDD (degenerative disc disease), cervical     Depression     Financial difficulties     house repairs, dental work    GERD (gastroesophageal reflux disease)     Hiatal hernia     Hyperlipidemia     Hypertension     IBS (irritable bowel syndrome)     Impaired mobility     off balance, arthritis (hips, knees, feet)    Irregular heartbeat     Moderate asthma     MVP (mitral valve prolapse)     Obesity     OCD (obsessive compulsive disorder)     Osteoporosis     Pancreatitis     Peptic ulceration     Seizures (CMS-HCC)     last episode last week, focal with right facial droop/right arm weakness    Stroke (CMS-HCC) 2016    right arm weakness when she gets tired    Visual impairment     contacts    Vitamin D deficiency        Medications:       Current Outpatient Medications:     acetaminophen (TYLENOL ARTHRITIS ORAL), Take by mouth daily as needed., Disp: , Rfl:     albuterol 2.5 mg /3 mL (0.083 %) nebulizer solution, Inhale 3 mL (2.5 mg total) by nebulization four (4) times a day. (Patient taking differently: Inhale 3 mL (2.5 mg total) by nebulization four (4) times a day. PRN), Disp: 100 mL, Rfl: 3    albuterol HFA 90 mcg/actuation inhaler, INHALE 2 PUFFS BY MOUTH EVERY 6 HOURS AS NEEDED FOR WHEEZE, Disp: 18 g, Rfl: 5    aspirin (ECOTRIN) 81 MG tablet, Take 1 tablet (81 mg total) by mouth daily., Disp: , Rfl:     atorvastatin (LIPITOR) 10 MG tablet, Take 1 tablet (10 mg total) by mouth daily., Disp: 90 tablet, Rfl: 3    blood-glucose meter kit, Use as instructed, Disp: 1 each, Rfl: 0    empty container (SHARPS-A-GATOR DISPOSAL SYSTEM) Misc, Use as directed for sharps disposal, Disp: 1 each, Rfl: 2    ergocalciferol-1,250 mcg, 50,000 unit, (DRISDOL) 1,250 mcg (50,000 unit) capsule, 1 capsule weekly for 12 weeks, Disp: 12 capsule, Rfl: 2    evolocumab 140 mg/mL PnIj, Inject the contents of one pen (140 mg) under the skin every fourteen (14) days., Disp: 6 mL, Rfl: 3    fluticasone propionate (FLOVENT HFA) 110 mcg/actuation inhaler, Inhale 2 puffs Two (2) times a day. As needed for cough, Disp: 12 g, Rfl: 2    furosemide (LASIX) 20 MG tablet, Take 1 tablet (20 mg total) by mouth Two (2) times a day., Disp: 180 tablet, Rfl: 1    ipratropium-albuterol (DUO-NEB) 0.5-2.5 mg/3 mL nebulizer, Inhale 3 mL every six (6) hours as needed. PRN, Disp: , Rfl:     linaCLOtide (LINZESS) 145 mcg capsule, Take 1 capsule (145 mcg total) by mouth daily., Disp: 90 capsule, Rfl: 1    MOUNJARO 5 mg/0.5 mL PnIj, 7.5 mg., Disp: , Rfl: multivitamin (TAB-A-VITE/THERAGRAN) per tablet, Take 1 tablet by mouth daily., Disp: , Rfl:     naltrexone 4.5 mg capsule, Take by mouth daily., Disp: 30 capsule, Rfl: 0    buPROPion (WELLBUTRIN XL) 150 MG 24 hr tablet, Take 1 tablet (150 mg total) by mouth daily. (Patient not taking: Reported  on 10/08/2022), Disp: 90 tablet, Rfl: 3    omeprazole (PRILOSEC) 20 MG capsule, Take 1 capsule (20 mg total) by mouth daily. May take 2nd capsule as needed for gastritis, Disp: 90 capsule, Rfl: 0    polyethylene glycol (MIRALAX) 17 gram packet, Take 17 g by mouth daily. For 1 week then every other day for 1 week., Disp: 30 packet, Rfl: 3     Allergies:   Allergies   Allergen Reactions    Penicillins Hives and Shortness Of Breath     Can take amoxicillian    Tramadol Hives and Shortness Of Breath     Breathing difficulty   Blurred vision  Chest pain    Gabapentin Other (See Comments)     Seizure      Pravastatin Muscle Pain    Simvastatin Muscle Pain       Pertinent Social Hx and Habits: EMR reviewed    Pertinent Family Hx: EMR reviewed    Objective:      BP Readings from Last 3 Encounters:   10/08/22 104/60   08/07/22 126/84   07/23/22 121/76        Vitals:    10/08/22 1054   BP: 104/60   BP Site: R Arm   BP Position: Sitting   Pulse: 86   SpO2: 98%   Weight: (!) 136.1 kg (300 lb)   Height: 165.1 cm (5' 5)        Physical Exam:    General Appearance: WDWN in NAD. BMI 49.92 kg/m2.  Skin: W, D, I  HEENT: PERRLA, EOMI.  Respiratory: Clear throughout  Cardio: RRR  Abdomen: Soft and non-tender. Hypoactive bowel sounds.  Neurologic: A & O X 4, Grossly intact, stable gait  PSYCH: Behavior calm and cooperative    Diagnostics:     Lab Results   Component Value Date    WBC 8.4 08/06/2021    HGB 14.2 04/02/2022    HCT 44.8 (H) 08/06/2021    PLT 321 08/06/2021       Lab Results   Component Value Date    NA 140 07/03/2022    K 3.8 07/03/2022    CL 109 (H) 07/03/2022    CO2 26.0 07/03/2022    BUN 11 07/03/2022    CREATININE 0.70 07/03/2022 GLU 88 07/03/2022    CALCIUM 9.6 07/03/2022    MG 2.1 08/06/2021       Lab Results   Component Value Date    BILITOT 0.6 08/06/2021    PROT 7.4 08/06/2021    ALBUMIN 3.9 08/06/2021    ALT 14 08/06/2021    AST 19 08/06/2021    ALKPHOS 62 08/06/2021       No results found for: PT, INR, APTT     TSH   Date Value Ref Range Status   03/21/2021 0.365 (L) 0.550 - 4.780 uIU/mL Final   08/13/2020 0.825 0.550 - 4.780 uIU/mL Final        I attest that I, Benita Stabile, personally documented this note while acting as scribe for Olena Leatherwood, NP.      Benita Stabile, Scribe.  10/08/2022     The documentation recorded by the scribe accurately reflects the service I personally performed and the decisions made by me.     Olena Leatherwood, NP    Dr. Betha Loa, DNP, FNP-BC  [facility] at [facility] Certified Doctor of Nursing Practice   [phone]

## 2022-10-08 ENCOUNTER — Ambulatory Visit: Admit: 2022-10-08 | Discharge: 2022-10-09 | Payer: MEDICARE | Attending: Family | Primary: Family

## 2022-10-08 DIAGNOSIS — R1013 Epigastric pain: Principal | ICD-10-CM

## 2022-10-08 MED ORDER — POLYETHYLENE GLYCOL 3350 17 GRAM ORAL POWDER PACKET
PACK | Freq: Every day | ORAL | 3 refills | 30 days | Status: CP
Start: 2022-10-08 — End: ?

## 2022-10-08 MED ORDER — OMEPRAZOLE 20 MG CAPSULE,DELAYED RELEASE
ORAL_CAPSULE | Freq: Every day | ORAL | 0 refills | 90 days | Status: CP
Start: 2022-10-08 — End: ?

## 2022-10-24 DIAGNOSIS — R635 Abnormal weight gain: Principal | ICD-10-CM

## 2022-10-24 MED ORDER — FUROSEMIDE 20 MG TABLET
ORAL_TABLET | Freq: Two times a day (BID) | ORAL | 1 refills | 90 days | Status: CP
Start: 2022-10-24 — End: ?

## 2022-10-27 NOTE — Unmapped (Signed)
The [facility] has made a second and final attempt to reach this patient to refill the following medication:Repatha.      We have been unable to leave messages on the following phone numbers: [phone] and have sent a Mychart questionnaire..    Dates contacted: 10/21/22, 10/27/22  Last scheduled delivery: 07/29/22    The patient may be at risk of non-compliance with this medication. The patient should call the [facility] at [phone]  Option 4, then Option 5: Cardiology, Endocrinology to refill medication.    Moshe Salisbury   [facility] Specialty Technician

## 2022-11-06 ENCOUNTER — Ambulatory Visit: Admit: 2022-11-06 | Discharge: 2022-11-06 | Payer: MEDICARE

## 2022-11-06 DIAGNOSIS — M2550 Pain in unspecified joint: Principal | ICD-10-CM

## 2022-11-06 DIAGNOSIS — M5440 Lumbago with sciatica, unspecified side: Principal | ICD-10-CM

## 2022-11-06 DIAGNOSIS — G8929 Other chronic pain: Principal | ICD-10-CM

## 2022-11-06 LAB — RHEUMATOID FACTOR, QUANT: RHEUMATOID FACTOR: 24.4 [IU]/mL — ABNORMAL HIGH (ref <14.0)

## 2022-11-06 NOTE — Unmapped (Signed)
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      Let me know if you have any questions.    Dr. Sullivan Lone

## 2022-11-06 NOTE — Unmapped (Signed)
You were seen by [facility] today.      Your physician today was Dr. Danella Maiers    [facility]  [address] 3rd [address]   [place], [place] [address]  Phone:  [phone]  Fax:  [phone]     Summary of Plan for 11/06/22    Today we discussed the following:    I think it is unlikely you have inflammatory arthritis, that is arthritis where you immune system is confused and starts to attack your joints.   However, given how much pain you are having for a long time, we decided to repeat a couple blood tests (which were previously very slightly high) and x-rays.     Your pain in your thighs is most likely from bursitis. (More info below.)    I think at least some of your pain is most likely due to fibromyalgia which is a condition where you have amplified pain.  I am providing you with information from the [employer] of Rheumatology as well as my own recommendations on treatment which focuses on three pillars of treatment including physical activity, good restorative health, and mental health.      The three pillars of treating fibromyalgia are the following:    1. Regular physical activity where you get your heart beating a little faster and you breath a little harder than normal.   I recommend you start an aerobic program such as walking. Start with 10 minutes 5 days/week.    After 2 weeks, gradually add 3-5 minutes per session every 1-2 weeks.  Keep a diary of your progress.  Reward yourself for success!   Walk With Ease is an excellent walking program.   Resources:   Exerciseismedicine.org  Everybodywalk.org    2. Good restorative sleep.   Practice good sleep practices including going to bed at the same time every night, only using your bed for sleep, limit caffeine 4-6 hours before bedtime, limit TV and computer use for 1-2 hours before bed, etc.  Some people benefit from a sleep study to look for sleep apnea. You can discuss this more with your primary care physician.  Resource: http://blog.BeepThis.co.uk    3. Individuals with fibromyalgia very often have symptoms of depression, anxiety, and symptoms of post-traumatic stress from prior traumatic event or abuse. It is important to address these symptoms to help improve your pain. You can talk more to your primary care physician or to a therapist.       [facility] of [place] has a helpful website:  DeathPrevention.it     Diagnostic testing recommendations:  Labs today (in clinic or on the first floor of the [facility] [employer])  X-rays today (on the first floor of the [facility] [employer] [facility])    Therapeutic / Treatment recommendations:   No new medicines    Referrals and follow-up recommendations:  We will let you know if tests are abnormal and we recommend follow-up in rheumatology clinic.  Otherwise, please let us know if symptoms persists, new questions or concerns arise, or we can otherwise be of assistance and we would be happy to see you back.      When should I expect results?   Please note that it may take up to 21 days for results to return.  If you have not been notified by phone, mail or [facility] (if applicable) in 21 days, please contact our office at [phone] or via [facility] messaging [email or web address])  What if I have questions or concerns after the visit?   If you have non-urgent questions, the best way to contact your provider is to send a MyChart message by visiting [email or web address].  You can also use MyChart to request refills and access test results.  We will do our best to respond within 2-3 business days, although it may take longer in some circumstances.  If you have immediate concerns, please contact our clinic by phone  at [phone].       Thank you for allowing [facility] to be involved in your care!          Bursitis: Care Instructions  Your Care Instructions     Bursitis is inflammation of the bursa. A bursa is a small sac of fluid that cushions a joint and helps it move easily. A bursa sits between a bone in the hip and the muscles and tendons in the thigh and buttock. Injury or overuse of the hip can cause bursitis. Activities that can lead to bursitis include twisting and rapid joint movement. Bursitis can cause hip pain.  Bursitis usually gets better if you avoid the activity that caused it. If pain lasts or gets worse despite home treatment, your doctor may draw fluid from the bursa through a needle. This may relieve your pain and help your doctor know if you have an infection. If so, your doctor will prescribe antibiotics. If you have inflammation only, you may get a corticosteroid shot to reduce swelling and pain. Sometimes surgery is needed to drain or remove the bursa.  Follow-up care is a key part of your treatment and safety. Be sure to make and go to all appointments, and call your doctor if you are having problems. It's also a good idea to know your test results and keep a list of the medicines you take.  How can you care for yourself at home?  Put ice or a cold pack on your hip for 10 to 20 minutes at a time. Put a thin cloth between the ice and your skin.  After 3 days of using ice, you may use heat on your hip. You can use a hot water bottle, a heating pad set on low, or a warm, moist towel.  Rest your hip. Stop any activities that cause pain. Switch to activities that do not stress your hip.  Take your medicines exactly as prescribed. Call your doctor if you think you are having a problem with your medicine.  Ask your doctor if you can take an over-the-counter pain medicine, such as acetaminophen (Tylenol), ibuprofen (Advil, Motrin), or naproxen (Aleve). Be safe with medicines. Read and follow all instructions on the label.  To prevent stiffness, gently move the hip joint as much as you can without pain every day. As the pain gets better, keep doing range-of-motion exercises. Ask your doctor for exercises that will make the muscles around the hip joint stronger. Do these as directed.  You can slowly return to the activity that caused the pain, but do it with less effort until you can do it without pain or swelling. Be sure to warm up before and stretch after you do the activity.  When should you call for help?   Call your doctor now or seek immediate medical care if:    You have a fever.     You have increased swelling or redness in your hip.     You cannot use your hip, or the pain in your hip  gets worse.   Watch closely for changes in your health, and be sure to contact your doctor if:    You have pain for 2 weeks or longer despite home treatment.   Where can you learn more?  Go to [facility] at [email or web address]  Select Patient Education under [employer]. Enter [phone] in the search box to learn more about Hip Bursitis: Care Instructions.  Current as of: May 02, 2019               Content Version: 12.8  ?? 2006-2021 [employer].   Care instructions adapted under license by [facility]. If you have questions about a medical condition or this instruction, always ask your healthcare professional. Healthwise, Incorporated disclaims any warranty or liability for your use of this information.            Hip Bursitis: Exercises  Introduction  Here are some examples of exercises for you to try. The exercises may be suggested for a condition or for rehabilitation. Start each exercise slowly. Ease off the exercises if you start to have pain.  You will be told when to start these exercises and which ones will work best for you.  How to do the exercises  Hip rotator stretch   Lie on your back with both knees bent and your feet flat on the floor.  Put the ankle of your affected leg on your opposite thigh near your knee.  Use your hand to gently push your knee away from your body until you feel a gentle stretch around your hip.  Hold the stretch for 15 to 30 seconds.  Repeat 2 to 4 times.  Repeat steps 1 through 5, but this time use your hand to gently pull your knee toward your opposite shoulder.    Iliotibial band stretch   Lean sideways against a wall. If you are not steady on your feet, hold on to a chair or counter.  Stand on the leg with the affected hip, with that leg close to the wall. Then cross your other leg in front of it.  Let your affected hip drop out to the side of your body and against wall. Then lean away from your affected hip until you feel a stretch.  Hold the stretch for 15 to 30 seconds.  Repeat 2 to 4 times.    Straight-leg raises to the outside   Lie on your side, with your affected hip on top.  Tighten the front thigh muscles of your top leg to keep your knee straight.  Keep your hip and your leg straight in line with the rest of your body, and keep your knee pointing forward. Do not drop your hip back.  Lift your top leg straight up toward the ceiling, about 12 inches off the floor. Hold for about 6 seconds, then slowly lower your leg.  Repeat 8 to 12 times.    Clamshell   Lie on your side, with your affected hip on top and your head propped on a pillow. Keep your feet and knees together and your knees bent.  Raise your top knee, but keep your feet together. Do not let your hips roll back. Your legs should open up like a clamshell.  Hold for 6 seconds.  Slowly lower your knee back down. Rest for 10 seconds.  Repeat 8 to 12 times.    Follow-up care is a key part of your treatment and safety. Be sure to make and go to all appointments,  and call your doctor if you are having problems. It's also a good idea to know your test results and keep a list of the medicines you take.  Where can you learn more?  Go to [facility] at [email or web address]  Select Patient Education under [employer]. Enter [phone] in the search box to learn more about Hip Bursitis: Exercises.  Current as of: May 02, 2019               Content Version: 12.9         FIBROMYALGIA (Information from the [employer] of Rheumatology)    Fast Facts  Fibromyalgia affects 2 - 4 percent of people, women more often than men.  Fibromyalgia is not an autoimmune or inflammation based illness, but research suggests the nervous system is involved.  Doctors diagnose fibromyalgia based on all the patient???s relevant symptoms (what you feel), no longer just on the number of tender places during an examination.  There is no test to detect this disease, but you may need lab tests or X-rays to rule out other health problems.  Though there is no cure, medications can reduce symptoms in some patients.  Patients also may feel better with proper self-care, such as exercise and getting enough sleep.    Fibromyalgia is a common neurologic health problem that causes widespread pain and tenderness (sensitivity to touch). The pain and tenderness tend to come and go, and move about the body. Most often, people with this chronic (long-term) illness are fatigued (very tired) and have sleep problems. The diagnosis can be made with a careful examination.    Fibromyalgia is most common in women, though it can occur in men. It most often starts in middle adulthood, but can occur in the teen years and in old age. You are at higher risk for fibromyalgia if you have a rheumatic disease (health problem that affects the joints, muscles and bones). These include osteoarthritis, lupus, rheumatoid arthritis, or ankylosing spondylitis.    What is fibromyalgia?  Fibromyalgia is a neurologic chronic health condition that causes pain all over the body and other symptoms. Other symptoms of fibromyalgia that patients most often have are:    Tenderness to touch or pressure affecting muscles and sometimes joints or even the skin  Severe fatigue  Sleep problems (waking up unrefreshed)  Problems with memory or thinking clearly    Some patients also may have:    Depression or anxiety  Migraine or tension headaches  Digestive problems: irritable bowel syndrome (commonly called IBS) or gastroesophageal reflux disease (often referred to as GERD)  Irritable or overactive bladder  Pelvic pain  Temporomandibular disorder - often called TMJ (a set of symptoms including face or jaw pain, jaw clicking, and ringing in the ears)    What causes fibromyalgia?  The causes of fibromyalgia are unclear. They may be different in different people. Current research suggests involvement of the nervous system, particularly the central nervous system (brain and spinal cord). Fibromyalgia is not from an autoimmune, inflammation, joint, or muscle disorder. Fibromyalgia may run in families. There likely are certain genes that can make people more prone to getting fibromyalgia and the other health problems that can occur with it. Genes alone, though, do not cause fibromyalgia.    There is most often some triggering factor that sets off fibromyalgia. It may be spine problems, arthritis, injury, or other type of physical stress. Emotional stress also may trigger this illness. The result is a change in the way the  body ???talks??? with the spinal cord and brain. Levels of brain chemicals and proteins may change. More recently, Fibromyalgia has been described as Central Pain Amplification disorder, meaning the volume of pain sensation in the brain is turned up too high.    Although Fibromyalgia can affect quality of life, it is still considered medically benign. It does not cause any heart attacks, stroke, cancer, physical deformities, or loss of life.    How is fibromyalgia diagnosed?  A doctor will suspect fibromyalgia based on your symptoms. Doctors may require that you have tenderness to pressure or tender points at a specific number of certain spots before saying you have fibromyalgia, but they are not required to make the diagnosis (see the Box). A physical exam can be helpful to detect tenderness and to exclude other causes of muscle pain. There are no diagnostic tests (such as X-rays or blood tests) for this problem. Yet, you may need tests to rule out another health problem that can be confused with fibromyalgia.    Because widespread body pain is the main feature of fibromyalgia, health care providers will ask you to describe your pain. This may help tell the difference between fibromyalgia and other diseases with similar symptoms. Other conditions such as hypothyroidism (underactive thyroid gland) and polymyalgia rheumatica sometimes mimic fibromyalgia. Blood tests can tell if you have either of these problems. Sometimes, fibromyalgia is confused with rheumatoid arthritis or lupus. But, again, there is a difference in the symptoms, physical findings and blood tests that will help your health care provider detect these health problems. Unlike fibromyalgia, these rheumatic diseases cause inflammation in the joints and tissues.    Criteria Needed for a Fibromyalgia Diagnosis  Pain and symptoms over the past week, based on the total of number of painful areas out of 19 parts of the body plus level of severity of these symptoms:  a. Fatigue  b. Waking unrefreshed  c. Cognitive (memory or thought) problems  Symptoms lasting at least three months at a similar level  No other health problem that would explain the pain and other symptoms    How is fibromyalgia treated?  There is no cure for fibromyalgia. However, symptoms can be treated with both non-drug and medication based treatments. Many times the best outcomes are achieved by using multiple types of treatments.    Non-Drug Therapies: People with fibromyalgia should use non-drug treatments as well as any medicines their doctors suggest. Research shows that the most effective treatment for fibromyalgia is physical exercise. Physical exercise should be used in addition to any drug treatment. Patients benefit most from regular aerobic exercises. Other body-based therapies, including Tai Chi and yoga, can ease fibromyalgia symptoms. Although you may be in pain, low impact physical exercise will not be harmful.    Cognitive behavioral therapy is a type of therapy focused on understanding how thoughts and behaviors affect pain and other symptoms. CBT and related treatments, such as mindfulness, can help patients learn symptom reduction skills that lessen pain. Mindfulness is a Engineer, drilling that cultivates present moment awareness. Mindfulness based stress reduction has been shown to significantly improve symptoms of fibromyalgia.    Other complementary and alternative therapies (sometimes called CAM or integrative medicine), such as acupuncture, chiropractic and massage therapy, can be useful to manage fibromyalgia symptoms. Many of these treatments, though, have not been well tested in patients with fibromyalgia.    It is important to address risk factors and triggers for fibromyalgia including sleep disorders, such as sleep  apnea, and mood problems such as stress, anxiety, panic disorder, and depression. This may require involvement of other specialists such as a Sleep Medicine doctor, Psychiatrist, and therapist.    Medications: The U.S. Food and Drug Administration has approved three drugs for the treatment of fibromyalgia. They include two drugs that change some of the brain chemicals (serotonin and norepinephrine) that help control pain levels: duloxetine (Cymbalta) and milnacipran (Savella). Older drugs that affect these same brain chemicals also may be used to treat fibromyalgia. These include amitriptyline (Elavil) and cyclobenzaprine (Flexeril). Other antidepressant drugs can be helpful in some patients. Side effects vary by the drug. Ask your doctor about the risks and benefits of your medicine.    The other drug approved for fibromyalgia is pregabalin (Lyrica). Pregabalin and another drug, gabapentin (Neurontin), work by blocking the over activity of nerve cells involved in pain transmission. These medicines may cause dizziness, sleepiness, swelling and weight gain.    It is strongly recommended to avoid opioid narcotic medications for treating fibromyalgia. The reason for this is that research evidence shows these drugs are not of helpful to most people with fibromyalgia, and will cause greater pain sensitivity or make pain persist. Tramadol (Ultram) may be used to treat fibromyalgia pain if short-term use of an opioid narcotic is needed. Over-the-counter medicines such as acetaminophen (Tylenol) or nonsteroidal anti-inflammatory drugs (commonly called NSAIDs) like ibuprofen (Advil, Motrin) or naproxen (Aleve, Anaprox) are not effective for fibromyalgia pain. Yet, these drugs may be useful to treat the pain triggers of fibromyalgia. Thus, they are most useful in people who have other causes for pain such as arthritis in addition to fibromyalgia.    For sleep problems, some of the medicines that treat pain also improve sleep. These include cyclobenzaprine (Flexeril), amitriptyline (Elavil), gabapentin (Neurontin) or pregabalin (Lyrica). It is not recommended that patients with fibromyalgia take sleeping medicines like zolpidem (Ambien) or benzodiazepine medications.    Living with fibromyalgia  Even with the many treatment options, patient self-care is vital to improving symptoms and daily function. In concert with medical treatment, healthy lifestyle behaviors can reduce pain, increase sleep quality, lessen fatigue and help you cope better with fibromyalgia. With proper treatment and self-care, you can get better and live a more normal life. Here are some self-care tips for living with fibromyalgia:    Make time to relax each day. Deep-breathing exercises and meditation will help reduce the stress that can bring on symptoms.  Set a regular sleep pattern. Go to bed and wake up at the same time each day. Getting enough sleep lets your body repair itself, physically and mentally. Also, avoid daytime napping and limit caffeine intake, which can disrupt sleep. Nicotine is a stimulant, so those fibromyalgia patients with sleep problems should stop smoking.  Exercise often. This is a very important part of fibromyalgia treatment. While difficult at first, regular exercise often reduces pain symptoms and fatigue. Patients should follow the saying, ???Start low, go slow.??? Slowly add daily fitness into your routine. For instance, take the stairs instead of the elevator, or park further away from the store. As your symptoms decrease with drug treatments, start increasing your activity. Add in some walking, swimming, water aerobics and/or stretching exercises, and begin to do things that you stopped doing because of your pain and other symptoms. It takes time to create a comfortable routine. Just get moving, stay active and don't give up!  Educate yourself. Nationally recognized organizations like the [employer] and the [employer] Fibromyalgia Association  are great resources for information. Share this information with family, friends and co-workers.  Look forward, not backward. Focus on what you need to do to get better, not what caused your illness.    The role of the rheumatologist  Fibromyalgia is not a form of arthritis (joint disease). It does not cause inflammation or damage to joints, muscles or other tissues. However, because fibromyalgia can cause chronic pain and fatigue similar to arthritis, some people may advise you to see a rheumatologist. As a result, often a rheumatologist detects this disease (and rules out rheumatic diseases). For long term care, you do not need to follow with a rheumatologist. Your primary care physician can provide all the other care and treatment of fibromyalgia that you need.    Additional Information  The [employer] of Rheumatology has compiled this list to give you a starting point for your own additional research. The ACR does not endorse or maintain these websites and is not responsible for any information or claims provided on them. It is always best to talk with your rheumatologist for more information and before making any decisions about your care.    [employer] of Arthritis and Musculoskeletal and Skin Diseases  National Fibromyalgia Association  National Fibromyalgia and Chronic Pain Association  National Fibromyalgia Partnership, Inc.  The American Fibromyalgia Syndrome Association, Inc.    Updated March 2017 by Anson Oregon, MD and reviewed by the [facility] of Rheumatology Committee on Communications and Marketing.    This information is provided for general education only. Individuals should consult a qualified health care provider for professional medical advice, diagnosis and treatment of a medical or health condition.    ?? 2017 [employer] of Rheumatology.
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
## 2022-11-06 NOTE — Unmapped (Signed)
Sent patient the following message:    Your sacroiliac joint x-rays were normal or without clinically significant abnormality.    Let me know if you have any questions.    Dr. Libbie Bartley

## 2022-11-11 LAB — CYCLIC CITRUL PEPTIDE ANTIBODY, IGG
CCP ANTIBODIES: 19 {ELISA'U} — ABNORMAL HIGH (ref <7.0)
CCP IGG ANTIBODIES: POSITIVE — AB

## 2022-11-12 NOTE — Unmapped (Signed)
Alyssa Herrera requested a refill of their Repatha Sureclick via IVR. The [facility] has scheduled delivery per the patients request via Same Day Courier to be delivered to their prescription address on 11/13/22.

## 2022-11-13 MED FILL — REPATHA SURECLICK 140 MG/ML SUBCUTANEOUS PEN INJECTOR: SUBCUTANEOUS | 84 days supply | Qty: 6 | Fill #2

## 2022-11-15 DIAGNOSIS — R1013 Epigastric pain: Principal | ICD-10-CM

## 2022-11-15 MED ORDER — OMEPRAZOLE 20 MG CAPSULE,DELAYED RELEASE
ORAL_CAPSULE | Freq: Every day | ORAL | 1 refills | 0 days
Start: 2022-11-15 — End: ?

## 2022-11-17 MED ORDER — OMEPRAZOLE 20 MG CAPSULE,DELAYED RELEASE
ORAL_CAPSULE | Freq: Every day | ORAL | 1 refills | 180 days | Status: CP
Start: 2022-11-17 — End: 2023-11-17

## 2022-11-20 NOTE — Unmapped (Signed)
Sent patient the following note:    Both of these tests were a little high. These are antibodies that you can see in Rheumatoid Arthritis (RA) however they are just a little high and your symptoms did not seem to fit with RA. I recommend you keep an eye on how you are feeling and if you have more joint pain or stiffness in the morning let us know and we can reevaluate.    Dr. Sullivan Lone

## 2022-12-02 DIAGNOSIS — Z8673 Personal history of transient ischemic attack (TIA), and cerebral infarction without residual deficits: Principal | ICD-10-CM

## 2022-12-03 ENCOUNTER — Ambulatory Visit: Admit: 2022-12-03 | Payer: MEDICARE

## 2022-12-23 ENCOUNTER — Emergency Department: Payer: 59

## 2022-12-23 ENCOUNTER — Emergency Department
Admission: EM | Admit: 2022-12-23 | Discharge: 2022-12-23 | Disposition: A | Discharge status: Home / Self Care | Payer: 59 | Source: Home / Self Care | Attending: Emergency Medicine | Admitting: Emergency Medicine

## 2022-12-23 ENCOUNTER — Other Ambulatory Visit: Payer: Self-pay

## 2022-12-23 ENCOUNTER — Encounter: Payer: Self-pay | Admitting: Emergency Medicine

## 2022-12-23 DIAGNOSIS — N390 Urinary tract infection, site not specified: Secondary | ICD-10-CM | POA: Diagnosis not present

## 2022-12-23 DIAGNOSIS — R197 Diarrhea, unspecified: Secondary | ICD-10-CM | POA: Diagnosis not present

## 2022-12-23 DIAGNOSIS — R112 Nausea with vomiting, unspecified: Secondary | ICD-10-CM | POA: Insufficient documentation

## 2022-12-23 DIAGNOSIS — R531 Weakness: Secondary | ICD-10-CM | POA: Insufficient documentation

## 2022-12-23 DIAGNOSIS — Z20822 Contact with and (suspected) exposure to covid-19: Secondary | ICD-10-CM | POA: Diagnosis not present

## 2022-12-23 DIAGNOSIS — K529 Noninfective gastroenteritis and colitis, unspecified: Secondary | ICD-10-CM

## 2022-12-23 LAB — URINALYSIS, ROUTINE W REFLEX MICROSCOPIC
Bilirubin Urine: NEGATIVE
Glucose, UA: NEGATIVE mg/dL
Hgb urine dipstick: NEGATIVE
Ketones, ur: 5 mg/dL — AB
Leukocytes,Ua: NEGATIVE
Nitrite: NEGATIVE
Protein, ur: 30 mg/dL — AB
Specific Gravity, Urine: 1.023 (ref 1.005–1.030)
pH: 5 (ref 5.0–8.0)

## 2022-12-23 LAB — BASIC METABOLIC PANEL
Anion gap: 8 (ref 5–15)
BUN: 11 mg/dL (ref 6–20)
CO2: 19 mmol/L — ABNORMAL LOW (ref 22–32)
Calcium: 8.5 mg/dL — ABNORMAL LOW (ref 8.9–10.3)
Chloride: 107 mmol/L (ref 98–111)
Creatinine, Ser: 0.72 mg/dL (ref 0.44–1.00)
GFR, Estimated: >60 mL/min (ref >60)
Glucose, Bld: 100 mg/dL — ABNORMAL HIGH (ref 70–99)
Potassium: 3.5 mmol/L (ref 3.5–5.1)
Sodium: 134 mmol/L — ABNORMAL LOW (ref 135–145)

## 2022-12-23 LAB — SARS CORONAVIRUS 2 BY RT PCR: SARS Coronavirus 2 by RT PCR: NEGATIVE

## 2022-12-23 LAB — CBC
HCT: 45.3 % (ref 36.0–46.0)
Hemoglobin: 15.5 g/dL — ABNORMAL HIGH (ref 12.0–15.0)
MCH: 30.9 pg (ref 26.0–34.0)
MCHC: 34.2 g/dL (ref 30.0–36.0)
MCV: 90.4 fL (ref 80.0–100.0)
Platelets: 326 10*3/uL (ref 150–400)
RBC: 5.01 MIL/uL (ref 3.87–5.11)
RDW: 12.3 % (ref 11.5–15.5)
WBC: 8.1 10*3/uL (ref 4.0–10.5)
nRBC: 0 % (ref 0.0–0.2)

## 2022-12-23 MED ORDER — KETOROLAC TROMETHAMINE 15 MG/ML IJ SOLN
15.0000 mg | Freq: Once | INTRAMUSCULAR | Status: AC
  Administered 2022-12-23: 15 mg via INTRAVENOUS
  Filled 2022-12-23: qty 1

## 2022-12-23 MED ORDER — NITROFURANTOIN MONOHYD MACRO 100 MG PO CAPS
100.0000 mg | ORAL_CAPSULE | Freq: Once | ORAL | Status: AC
  Administered 2022-12-23: 100 mg via ORAL
  Filled 2022-12-23: qty 1

## 2022-12-23 MED ORDER — NITROFURANTOIN MONOHYD MACRO 100 MG PO CAPS: 100.0000 mg | ORAL_CAPSULE | Freq: Two times a day (BID) | ORAL | 0 refills | Status: AC

## 2022-12-23 MED ORDER — SODIUM CHLORIDE 0.9 % IV BOLUS
1000.0000 mL | Freq: Once | INTRAVENOUS | Status: AC
  Administered 2022-12-23: 1000 mL via INTRAVENOUS

## 2022-12-23 MED ORDER — ONDANSETRON HCL 4 MG/2ML IJ SOLN
4.0000 mg | Freq: Once | INTRAMUSCULAR | Status: AC
  Administered 2022-12-23: 4 mg via INTRAVENOUS
  Filled 2022-12-23: qty 2

## 2022-12-23 MED ORDER — ONDANSETRON HCL 4 MG PO TABS: 4.0000 mg | ORAL_TABLET | Freq: Every day | ORAL | 1 refills | Status: AC | PRN

## 2022-12-23 MED ORDER — IOHEXOL 350 MG/ML SOLN
100.0000 mL | Freq: Once | INTRAVENOUS | Status: AC | PRN
  Administered 2022-12-23: 100 mL via INTRAVENOUS

## 2022-12-23 NOTE — ED Triage Notes (Signed)
Pt here with weakness since yesterday morning. Pt states she has not been able to eat or drink. Pt states she had a 102 temp at home. Pt also states she is aching all over. Pt endorses NVD.

## 2022-12-23 NOTE — ED Provider Notes (Signed)
[facility] Provider Note    Event Date/Time   First MD Initiated Contact with Patient 12/23/22 1111     (approximate)   History   Weakness   HPI  Aaliyha Guerriero Zar is a 47 y.o. female   Past medical history of hyperlipidemia, IBS, PTSD, bipolar, anxiety who presents emergency department with aching all over her body reported temperature of 102 at home, nausea vomiting diarrhea, suprapubic discomfort.  Denies dysuria.  Denies flank pain.  No vaginal discharge.  External Medical Documents Reviewed: Office visit from family medicine from April 2024 reviewing past medical history and medications      Physical Exam   Triage Vital Signs: ED Triage Vitals  Enc Vitals Group     BP 12/23/22 0927 103/70     Pulse Rate 12/23/22 0927 83     Resp 12/23/22 0927 18     Temp 12/23/22 0927 98.1 F (36.7 C)     Temp Source 12/23/22 0927 Oral     SpO2 12/23/22 0927 95 %     Weight 12/23/22 0926 298 lb 15.1 oz (135.6 kg)     Height 12/23/22 0926 5\' 5"  (1.651 m)     Head Circumference --      Peak Flow --      Pain Score 12/23/22 0926 9     Pain Loc --      Pain Edu? --      Excl. in GC? --     Most recent vital signs: Vitals:   12/23/22 0927  BP: 103/70  Pulse: 83  Resp: 18  Temp: 98.1 F (36.7 C)  SpO2: 95%    General: Awake, no distress.  CV:  Good peripheral perfusion. Resp:  Normal effort.  Abd:  No distention.  Other:  RLQ tender without rigidity or guarding, nontoxic-appearing comfortable patient with normal vital signs and afebrile.   ED Results / Procedures / Treatments   Labs (all labs ordered are listed, but only abnormal results are displayed) Labs Reviewed  BASIC METABOLIC PANEL - Abnormal; Notable for the following components:      Result Value   Sodium 134 (*)    CO2 19 (*)    Glucose, Bld 100 (*)    Calcium 8.5 (*)    All other components within normal limits  CBC - Abnormal; Notable for the following components:    Hemoglobin 15.5 (*)    All other components within normal limits  URINALYSIS, ROUTINE W REFLEX MICROSCOPIC - Abnormal; Notable for the following components:   Color, Urine YELLOW (*)    APPearance HAZY (*)    Ketones, ur 5 (*)    Protein, ur 30 (*)    Bacteria, UA MANY (*)    All other components within normal limits  SARS CORONAVIRUS 2 BY RT PCR  CBG MONITORING, ED     I ordered and reviewed the above labs they are notable for white blood cell count is normal.  EKG  ED ECG REPORT I, Pilar Jarvis, the attending physician, personally viewed and interpreted this ECG.   Date: 12/23/2022  EKG Time: 0934  Rate: 91  Rhythm: nsr  Axis: nl  Intervals:none  ST&T Change: no stemi    RADIOLOGY I independently reviewed and interpreted CT of the abdomen pelvis and see no obvious inflammatory changes or obstructive changes   PROCEDURES:  Critical Care performed: No  Procedures   MEDICATIONS ORDERED IN ED: Medications  sodium chloride 0.9 % bolus 1,000 mL (  0 mLs Intravenous Stopped 12/23/22 1416)  ondansetron (ZOFRAN) injection 4 mg (4 mg Intravenous Given 12/23/22 1133)  ketorolac (TORADOL) 15 MG/ML injection 15 mg (15 mg Intravenous Given 12/23/22 1155)  nitrofurantoin (macrocrystal-monohydrate) (MACROBID) capsule 100 mg (100 mg Oral Given 12/23/22 1231)  iohexol (OMNIPAQUE) 350 MG/ML injection 100 mL (100 mLs Intravenous Contrast Given 12/23/22 1244)     IMPRESSION / MDM / ASSESSMENT AND PLAN / ED COURSE  I reviewed the triage vital signs and the nursing notes.                                Patient's presentation is most consistent with acute presentation with potential threat to life or bodily function.  Differential diagnosis includes, but is not limited to, appendicitis, gastroenteritis, urinary tract infection, renal colic kidney stone, ovarian torsion   The patient is on the cardiac monitor to evaluate for evidence of arrhythmia and/or significant heart rate  changes.  MDM:    Most likely gastroenteritis in the setting of nausea vomiting diarrhea and abdominal pain.  Overall well-appearing nontoxic patient but does have some right lower quadrant tenderness for which I ordered a CT scan to rule out appendicitis, fortunately looks negative.  Ordered IV fluids, IV Zofran and IV morphine while workup is pending.  Urinalysis shows bacteriuria no inflammatory changes will give short course of urinary tract infection coverage.  Considered but doubt ovarian torsion especially now in the setting of no reported masses in the ovaries on CT scan highly unlikely.  Given stability and overall unremarkable exam and findings as above, plan will be for discharge anticipatory guidance and follow-up with PMD.       FINAL CLINICAL IMPRESSION(S) / ED DIAGNOSES   Final diagnoses:  Lower urinary tract infectious disease     Rx / DC Orders   ED Discharge Orders          Ordered    nitrofurantoin, macrocrystal-monohydrate, (MACROBID) 100 MG capsule  2 times daily        12/23/22 1151    ondansetron (ZOFRAN) 4 MG tablet  Daily PRN        12/23/22 1151             Note:  This document was prepared using Dragon voice recognition software and may include unintentional dictation errors.    Pilar Jarvis, MD 12/23/22 [phone]

## 2022-12-23 NOTE — Discharge Instructions (Addendum)
Take antibiotics for the full course as prescribed.  Take Zofran as needed for nausea and vomiting.  Take acetaminophen 650 mg and ibuprofen 400 mg every 6 hours for pain.  Take with food. Drink plenty of fluids to stay well-hydrated.  Find Pedialyte or similar electrolyte rehydration formulas at your local pharmacy. Thank you for choosing [place] for your health care today!  Please see your primary doctor this week for a follow up appointment.   If you have any new, worsening, or unexpected symptoms call your doctor right away or come back to the emergency department for reevaluation.  It was my pleasure to care for you today.   Daneil Dan Modesto Charon, MD

## 2022-12-31 DIAGNOSIS — K581 Irritable bowel syndrome with constipation: Principal | ICD-10-CM

## 2022-12-31 MED ORDER — MOUNJARO 12.5 MG/0.5 ML SUBCUTANEOUS PEN INJECTOR
0 refills | 0 days
Start: 2022-12-31 — End: ?

## 2022-12-31 MED ORDER — LINZESS 145 MCG CAPSULE
ORAL_CAPSULE | Freq: Every day | ORAL | 1 refills | 90 days | Status: CP
Start: 2022-12-31 — End: ?

## 2022-12-31 NOTE — Unmapped (Signed)
Patient is requesting the following refill  Requested Prescriptions     Pending Prescriptions Disp Refills    LINZESS 145 mcg capsule [Pharmacy Med Name: [name] 145 MCG CAPSULE] 90 capsule 1     Sig: TAKE 1 CAPSULE BY MOUTH EVERY DAY       Recent Visits  Date Type Provider Dept   10/08/22 Office Visit Desmond Dike, NP [facility] [address] [facility]   07/03/22 Office Visit Tessie Eke, MD [facility] [address] [facility]   04/02/22 Office Visit Johnn Hai, Loleta Rose, FNP [facility] [address] [facility]   03/17/22 Office Visit Jenell Milliner, MD [facility] [address] [facility]   02/13/22 Office Visit Johnn Hai, Loleta Rose, FNP [facility] [address] [facility]   Showing recent visits within past 365 days and meeting all other requirements  Future Appointments  No visits were found meeting these conditions.  Showing future appointments within next 365 days and meeting all other requirements

## 2023-01-01 MED FILL — MOUNJARO 12.5 MG/0.5 ML SUBCUTANEOUS PEN INJECTOR: 28 days supply | Qty: 2 | Fill #0

## 2023-01-30 NOTE — Unmapped (Signed)
The [facility] has made a second and final attempt to reach this patient to refill the following medication:Repatha.      We have been unable to leave messages on the following phone numbers: [phone], have sent a text message to the following phone numbers: [phone], and have sent a Mychart questionnaire..    Dates contacted: 01/22/23, 01/30/23  Last scheduled delivery: 11/12/22    The patient may be at risk of non-compliance with this medication. The patient should call the [facility] at [phone]  Option 4, then Option 5: Cardiology, Endocrinology to refill medication.    Moshe Salisbury   [facility] Specialty Technician

## 2023-02-24 ENCOUNTER — Telehealth: Admit: 2023-02-24 | Discharge: 2023-02-25 | Payer: MEDICARE

## 2023-02-24 DIAGNOSIS — U071 COVID-19 virus infection: Principal | ICD-10-CM

## 2023-02-24 NOTE — Unmapped (Signed)
[employer] [employer]ator Encounter  This medical encounter was conducted virtually using [email or web address]  TeleHealth protocols.    Patient ID: Alyssa Herrera is a 47 y.o. female who presents by video interaction for evaluation.    I have identified myself to the patient and conveyed my credentials to Jettie Pagan.   Patient has signed informed consent on file in medical record.    Present on Video Call: Is there someone else in the room? No..    Assessment/Plan:      [name] was seen today for sinusitis.    Diagnoses and all orders for this visit:    COVID-19 virus infection    - reviewed recommendation for Paxlovid, lack of appropriateness/benefit of using an antibiotic    - can let us know if Paxlovid is not at her pharmacy, if intolerant to Paxlovid then could try Molnupiravir   - symptom management   - isolation guidelines reviewed   - fu as needed       -- Patient verbalized an understanding of today's assessment and recommendations, as well as the purpose of ongoing medications.    Follow-up as Needed  and Follow-up with PCP        Medication adherence and barriers to the treatment plan have been addressed. Opportunities to optimize healthy behaviors have been discussed. Patient / caregiver voiced understanding.        Subjective:     Sinusitis  Associated symptoms include chills and coughing.     Alyssa Herrera is 47 y.o. and presents today in the [facility] with COVID related concerns.  The PCP for this patient is [name], FNP. Tested positive yesterday. Was seen at [facility] for sinus symptoms. She was started on Tessalon, Hydroxyzine, Ipratropium nasal spray, Ibuprofen. Symptoms started two days prior.   She is wanting a second opinion on whether or not tot ake the Paxlovid. She reports the urgent care sent it in for her, but she wonders if she should get an antibiotic or steroid instead.      ROS  Review of Systems   Constitutional:  Positive for chills.   Respiratory: Positive for cough.         All other ROS per HPI.    I have reviewed the problem list, past medical history, past family history, medications, and allergies and have updated/reconciled them if needed.          Objective:   Physical Exam  As part of this Video Visit, no in-person exam was conducted.  Video interaction permitted the following observations.    General: No acute distress. Wearing a mask. Nasal congestion.   RESP: Relaxed respiratory effort. No conversational dyspnea.   SKIN: No rashes noted.     NEURO: Normal coordination.  No tremors observed.  PSYCH: Alert and oriented.  Speech fluent and sensible.  Calm affect.            The patient reports they are physically located in [place] and is currently: at home. I conducted a audio/video visit. I spent  37m 48s on the video call with the patient. I spent an additional 6 minutes on pre- and post-visit activities on the date of service .

## 2023-03-25 MED ORDER — PEG 3350-ELECTROLYTES 236 GRAM-22.74 GRAM-6.74 GRAM-5.86 GRAM SOLUTION
Freq: Once | ORAL | 0 refills | 1 days | Status: CP
Start: 2023-03-25 — End: 2023-03-25

## 2023-03-25 NOTE — Unmapped (Signed)
Colonoscopy  Procedure #1     Procedure #2   [id number]  MRN     Endoscopist     Is the patient's health insurance [employer], [employer], [facility]), [employer], [employer], or [employer]?     Urgent procedure     Are you pregnant?     Are you in the process of scheduling or awaiting results of a heart ultrasound, stress test, or catheterization to evaluate new or worsening chest pain, dizziness, or shortness of breath?     Do you take: Plavix (clopidogrel), Coumadin (warfarin), Lovenox (enoxaparin), Pradaxa (dabigatran), Effient (prasugrel), Xarelto (rivaroxaban), Eliquis (apixaban), Pletal (cilostazol), or Brilinta (ticagrelor)?          Did ordering provider indicate how long to hold this medication in the order comments?          Which of the above medications are you taking?          What is the name of the medical practice that manages this medication?          What is the name of the medical provider who manages this medication?     Do you have hemophilia, von Willebrand disease, or low platelets?     Do you have a pacemaker or implanted cardiac defibrillator?     Has a [place] [facility] provider specified the location(s)?     Which location(s) did the [facility] provider specify?          [facility]          [place]          [facility]     Do you see a liver specialist for chronic liver disease?     Is the procedure indication for variceal screening?     Is procedure indication for variceal banding (this does NOT include variceal screening)?     Have you had a heart attack, stroke or heart stent placement within the past 6 months?     Month of event     Year of event (ONLY ENTER LAST 2 DIGITS)        5  Height (feet)   5  Height (inches)   274  Weight (pounds)   45.6  BMI          Did the ordering provider specify a bowel prep?          What bowel prep was specified?     Do you have an ostomy (bag on your stomach that collects your stool)?          Is it an ileostomy?          Is it a colostomy? Patient doesn't know.     Do you have chronic kidney disease?     Do you have chronic constipation or have you had poor quality bowel preps for past colonoscopies?     Do you have Crohn's disease or ulcerative colitis?     Have you had weight loss surgery?          When you walk around your house or grocery store, do you have to stop and rest due to shortness of breath, chest pain, or light-headedness?     Do you ever use supplemental oxygen?     Have you been hospitalized for cirrhosis of the liver or heart failure in the last 12 months?     Have you been treated for mouth or throat cancer with radiation or surgery?  Have you been told that it is difficult for doctors to insert a breathing tube in you during anesthesia?     Have you had a heart or lung transplant?          Are you on dialysis?     Do you have cirrhosis of the liver?     Do you have myasthenia gravis?     Is the patient a prisoner?   ################# ## ###################################################################################################################   MRN:  [id number]   Anticoag Review  No   Nurse Triage  No   GI clinic consult  No   Procedure(s):  Colonoscopy     0   Endoscopist:  0   Urgent:  No   Prep:  Nulytely Prep                  --------------------------- --- ----------------------------------------------------------------------------------------------------------------------------------------------------------------------------   [facility]ations:  [facility]     [facility]             Requested Locations:              ################# ## ###################################################################################################################

## 2023-03-26 DIAGNOSIS — E785 Hyperlipidemia, unspecified: Principal | ICD-10-CM

## 2023-03-26 MED ORDER — REPATHA SURECLICK 140 MG/ML SUBCUTANEOUS PEN INJECTOR
SUBCUTANEOUS | 3 refills | 84 days
Start: 2023-03-26 — End: ?

## 2023-04-01 MED ORDER — PEG 3350-ELECTROLYTES 236 GRAM-22.74 GRAM-6.74 GRAM-5.86 GRAM SOLUTION
0 refills | 0 days | Status: CP
Start: 2023-04-01 — End: ?

## 2023-04-01 MED ORDER — REPATHA SURECLICK 140 MG/ML SUBCUTANEOUS PEN INJECTOR
SUBCUTANEOUS | 0 refills | 84 days | Status: CP
Start: 2023-04-01 — End: ?

## 2023-04-06 DIAGNOSIS — R1310 Dysphagia, unspecified: Principal | ICD-10-CM

## 2023-04-06 DIAGNOSIS — K219 Gastro-esophageal reflux disease without esophagitis: Principal | ICD-10-CM

## 2023-04-22 ENCOUNTER — Ambulatory Visit: Admit: 2023-04-22 | Discharge: 2023-04-23 | Payer: MEDICARE | Attending: Adult Health | Primary: Adult Health

## 2023-04-22 LAB — COMPREHENSIVE METABOLIC PANEL
ALBUMIN: 3.4 g/dL (ref 3.4–5.0)
ALKALINE PHOSPHATASE: 52 U/L (ref 46–116)
ALT (SGPT): 12 U/L (ref 10–49)
ANION GAP: 6 mmol/L (ref 5–14)
AST (SGOT): 21 U/L (ref <=34)
BILIRUBIN TOTAL: 0.2 mg/dL — ABNORMAL LOW (ref 0.3–1.2)
BLOOD UREA NITROGEN: 9 mg/dL (ref 9–23)
BUN / CREAT RATIO: 11
CALCIUM: 9.6 mg/dL (ref 8.7–10.4)
CHLORIDE: 111 mmol/L — ABNORMAL HIGH (ref 98–107)
CO2: 26.6 mmol/L (ref 20.0–31.0)
CREATININE: 0.81 mg/dL
EGFR CKD-EPI (2021) FEMALE: >90 mL/min/{1.73_m2} (ref >=60)
GLUCOSE RANDOM: 80 mg/dL (ref 70–179)
POTASSIUM: 4.5 mmol/L (ref 3.4–4.8)
PROTEIN TOTAL: 6.6 g/dL (ref 5.7–8.2)
SODIUM: 144 mmol/L (ref 135–145)

## 2023-04-22 LAB — CBC
HEMATOCRIT: 41.4 % (ref 34.0–44.0)
HEMOGLOBIN: 13.7 g/dL (ref 11.3–14.9)
MEAN CORPUSCULAR HEMOGLOBIN CONC: 33.1 g/dL (ref 32.0–36.0)
MEAN CORPUSCULAR HEMOGLOBIN: 31.3 pg (ref 25.9–32.4)
MEAN CORPUSCULAR VOLUME: 94.8 fL (ref 77.6–95.7)
MEAN PLATELET VOLUME: 8.8 fL (ref 6.8–10.7)
PLATELET COUNT: 250 10*9/L (ref 150–450)
RED BLOOD CELL COUNT: 4.37 10*12/L (ref 3.95–5.13)
RED CELL DISTRIBUTION WIDTH: 13.2 % (ref 12.2–15.2)
WBC ADJUSTED: 11.3 10*9/L — ABNORMAL HIGH (ref 3.6–11.2)

## 2023-04-22 LAB — FERRITIN: FERRITIN: 127.1 ng/mL

## 2023-04-22 LAB — TSH: THYROID STIMULATING HORMONE: 0.652 u[IU]/mL (ref 0.550–4.780)

## 2023-04-22 LAB — LDL CHOLESTEROL, DIRECT: LDL CHOLESTEROL DIRECT: 60 mg/dL

## 2023-04-22 MED ORDER — ATORVASTATIN 10 MG TABLET
ORAL_TABLET | ORAL | 3 refills | 90 days | Status: CP
Start: 2023-04-22 — End: ?

## 2023-04-22 MED ORDER — REPATHA SURECLICK 140 MG/ML SUBCUTANEOUS PEN INJECTOR
SUBCUTANEOUS | 3 refills | 84 days | Status: CP
Start: 2023-04-22 — End: ?

## 2023-04-22 NOTE — Unmapped (Signed)
DIVISION OF CARDIOLOGY   [employer] [facility], [place]                                                                         Date of Service:  04/22/2023     Assessment/Plan     1. Coronary artery calcification  PET perfusion study showed coronary artery calcifications even though there is no ischemic burden. Strong FHx of CVD. No anginal symptoms   - continue ASA, statin, repatha  - now on GLP1 with ~20 lb wt loss  - continue regular exercise    2. Hyperlipidemia, unspecified hyperlipidemia type  Prior intolerance to higher intensity statins.   On atorva 10 mg every other day + repatha w/ excellent lipid control (LDL 162>>58). Tolerating well.   Recheck lipids     Lab Results   Component Value Date    LDL 58 08/06/2021     3. TIA  Has had 2 ED visits for likely TIA, none recent  Continue ASA, statin  Smoking cessation    4. Tobacco abuse  Did not tolerate wellbutrin  She had reduced smoking to 1/4 ppd  Working with [employer]    5. Fatigue, unspecified type  Has upcoming pcp appt, will check basic labs.   - TSH  - Comprehensive metabolic panel  - CBC  - Ferritin    6. Encounter for immunization  - INFLUENZA VACCINE IIV3(IM)(PF)6 [facility]      Return to clinic:    Return in about 6 months (around 10/20/2023).    Subjective:   Alyssa Herrera, Alyssa Rose, FNP  Chief complaint:  47 y.o. female with a history of coronary calcium, HLD, GERD, obesity, PTSD, TIA, depression and anxiety  is referred by Alyssa Dike, NP for f/up    History of Present Illness:  Patient has been seen in our clinic starting in April 2022 for chest pain.  Echo and PET stress test were both unremarkable.  Coronary calcifications were noted and she subsequently been started on aspirin, Repatha and low-dose atorvastatin (history of statin intolerance).  She is also had work-up of palpitations but Zio patch showed no significant arrhythmias, just 1 brief SVT. She has had TIAx2.     Last visit 07/2022. Here today for routine f/up. Continues to randomly have neck/shoulder pain. No exertional chest pain, SOB, or neurological symptoms. C/o fatigue. Denies snoring. Does not have period.   She tried wellbutrin but had SE. She has been working on smoking cessation on own - 1 pack lasts about 3-4 days.   She is now on mounjaro and lost about 20 lbs. She is now walking for exercise and doing water aerobics twice weekly.   Has not reestablished with therapist - feels mental health has improved a lot with exercise.   Her granddaughter is doing well, living with her (has custody, hx significant trauma).     Mother had heart attack at age of 62, with history of atrial fibrillation. Sister had a heart attack at 63, and history of strokes. Lives with 21 year old granddaughter. Has two children.     Past Medical History  Patient Active Problem List   Diagnosis    Anxiety    GERD (gastroesophageal reflux  disease)    Obesity    Vitamin D deficiency    Morbid (severe) obesity due to excess calories (CMS-HCC)    Moderate persistent asthma without complication    Asthma    Arthralgia    Coronary artery calcification    Degeneration of intervertebral disc    Hyperlipidemia    Irritable bowel syndrome    Low back pain    Mitral valve disorder    Nicotine dependence, uncomplicated    Other seasonal allergic rhinitis    Other chronic pain    TIA (transient ischemic attack)    MDD (major depressive disorder), recurrent episode, moderate (CMS-HCC)    PTSD (post-traumatic stress disorder)    History of TIA (transient ischemic attack)       Medications:  Current Outpatient Medications   Medication Sig Dispense Refill    acetaminophen (TYLENOL ARTHRITIS ORAL) Take by mouth daily as needed.      albuterol 2.5 mg /3 mL (0.083 %) nebulizer solution Inhale 3 mL (2.5 mg total) by nebulization four (4) times a day. (Patient taking differently: Inhale 3 mL (2.5 mg total) by nebulization four (4) times a day. PRN) 100 mL 3    albuterol HFA 90 mcg/actuation inhaler INHALE 2 PUFFS BY MOUTH EVERY 6 HOURS AS NEEDED FOR WHEEZE 18 g 5    aspirin (ECOTRIN) 81 MG tablet Take 1 tablet (81 mg total) by mouth daily.      blood-glucose meter kit Use as instructed 1 each 0    cholecalciferol, vitamin D3, (VITAMIN D3 ORAL) Take by mouth.      cyanocobalamin, vitamin B-12, (VITAMIN B-12 ORAL) Take by mouth.      empty container (SHARPS-A-GATOR DISPOSAL SYSTEM) Misc Use as directed for sharps disposal 1 each 2    ENULOSE 10 gram/15 mL solution TAKE 30 MILLILITERS ORALLY 2 TIMES A DAY AS NEEDED FOR CONSTIPATION      ergocalciferol-1,250 mcg, 50,000 unit, (DRISDOL) 1,250 mcg (50,000 unit) capsule 1 capsule weekly for 12 weeks 12 capsule 2    furosemide (LASIX) 20 MG tablet TAKE 1 TABLET BY MOUTH TWO TIMES A DAY. 180 tablet 1    hydrOXYzine (ATARAX) 10 MG tablet TAKE 1 TO 3 TAB BY MOUTH EVERY 4 TO 6 HOURS AS NEEDED      ipratropium-albuterol (DUO-NEB) 0.5-2.5 mg/3 mL nebulizer Inhale 3 mL every six (6) hours as needed. PRN      LINZESS 145 mcg capsule TAKE 1 CAPSULE BY MOUTH EVERY DAY 90 capsule 1    MOUNJARO 15 mg/0.5 mL PnIj       multivitamin (TAB-A-VITE/THERAGRAN) per tablet Take 1 tablet by mouth daily.      naltrexone 4.5 mg capsule Take by mouth daily. 30 capsule 0    omeprazole (PRILOSEC) 20 MG capsule Take 1 capsule (20 mg total) by mouth daily. May take 2nd capsule as needed for gastritis 180 capsule 1    ondansetron (ZOFRAN) 4 MG tablet TAKE 1 TABLET BY MOUTH DAILY AS NEEDED FOR NAUSEA OR VOMITING.      polyethylene glycol (GOLYTELY) 236-22.74-6.74 gram solution Take by mouth as directed per [facility] GI prep instructions, for split bowel prep. (Patient taking differently: Take by mouth as directed per [facility] GI prep instructions, for split bowel prep. Not yet) 4000 mL 0    polyethylene glycol (MIRALAX) 17 gram packet Take 17 g by mouth daily. For 1 week then every other day for 1 week. 30 packet 3    atorvastatin (LIPITOR) 10 MG tablet  Take 1 tablet (10 mg total) by mouth every other day. 45 tablet 3 evolocumab (REPATHA SURECLICK) 140 mg/mL PnIj Inject the contents of one pen (140 mg) under the skin every fourteen (14) days. 6 mL 3    ipratropium (ATROVENT) 42 mcg (0.06 %) nasal spray 2 sprays into each nostril. (Patient not taking: Reported on 04/22/2023)      MOUNJARO 10 mg/0.5 mL PnIj INJECT 10 MILLIGRAMS SUBCUTANEOUSLY ONCE A WEEK (Patient not taking: Reported on 04/22/2023)      tirzepatide (MOUNJARO) 12.5 mg/0.5 mL PnIj Inject 12.5 Milligrams subcutaneous injection once a week (Patient not taking: Reported on 04/22/2023) 2 mL 0     No current facility-administered medications for this visit.       Allergies  Allergies   Allergen Reactions    Penicillins Hives, Shortness Of Breath and Anaphylaxis     Can take amoxicillian    Tramadol Hives, Shortness Of Breath and Anaphylaxis     Breathing difficulty     Blurred vision    Chest pain    Gabapentin Other (See Comments)     Seizure      Pravastatin Muscle Pain    Simvastatin Muscle Pain       Social History:   Social History     Tobacco Use    Smoking status: Some Days     Current packs/day: 0.25     Average packs/day: 0.3 packs/day for 15.0 years (3.8 ttl pk-yrs)     Types: Cigarettes     Passive exposure: Never    Smokeless tobacco: Never    Tobacco comments:     Working on [employer] - 1PPD   Vaping Use    Vaping status: Never Used   Substance Use Topics    Alcohol use: No    Drug use: Yes     Types: Marijuana     Comment: 1-3x monthly        Family History:  Family History   Problem Relation Age of Onset    Colorectal Cancer Father     Arthritis Father     Diabetes Father     Drug abuse Father     Heart disease Father     Hypertension Father     Stroke Father     Clotting disorder Father     Ulcers Father     Colorectal Cancer Paternal Uncle     Ovarian cancer Mother     Arthritis Mother     Asthma Mother     Cancer Mother         Uteral    COPD Mother     Depression Mother     Diabetes Mother     Heart disease Mother     Hyperlipidemia Mother Hypertension Mother     Kidney disease Mother     Mental illness Mother     Stroke Mother     Vision loss Mother         Due to massive heart attack    Clotting disorder Mother     Liver disease Mother     Ulcers Mother     Glaucoma Mother     Breast cancer Paternal Grandmother     Asthma Sister     Depression Sister     Diabetes Sister     Heart disease Sister     Hypertension Sister     Stroke Sister     Birth defects Sister  Prime    Clotting disorder Sister     Ulcers Sister     Cancer Maternal Grandmother         Breast    Early death Sister     Early death Brother     Learning disabilities Son     Learning disabilities Son     Aneurysm Paternal Grandfather        ROS- 12 system review is negative other than what is specified in the History of Present Illness.      Objective:   Physical Exam  Vitals:    04/22/23 1529   BP: 134/67   BP Position: Sitting   Pulse: 83   SpO2: 100%   Weight: (!) 126.6 kg (279 lb)   Height: 165.1 cm (5' 5)      General-  Obese appearing female in no apparent distress.  Neurologic- Alert and oriented X3.  Cranial nerve II-XII grossly intact.  HEENT-  Normocephalic atraumatic head.  No scleral icterus.  MMM  Neck- Supple, no carotid bruis, no JVD  Lungs- Clear to auscultation, no wheezes, rhonchi, or rhales.  Heart-  RRR, no MRG.  Extremities-  No clubbing or cyanosis.  No pitting edema to LE bilaterally.  Pulses- 2+ pulses in radial and dorsalis pedis bilaterally.  Psych- Normal mood, appropriate.    Laboratory data:    I have personally reviewed the images of the following diagnostic studies.      Electrocardiogram:  From 09/28/20 showed SR, borderline low voltage.    Echocardiogram:  From 06/2015 at [facility] showed LV systolic function and size were normal. Estimated EF 60-65%. Wall motion was normal; there were no regional wall motion abnormalities. Left ventricular diastolic function parameters were normal. LA normal in size. RV systolic function was normal. Pulmonary artery systolic pressure was within the normal range.     From 09/21/20 showed LV upper normal in size with normal wall thickness, LVEF 55%, normal RV systolic function, no significant valvular abnormalities.    Nuclear stress test:  PET CT stress test showed 10/11/20 showed normal myocardial perfusion study. No evidence of any significant ischemia or scar. Left ventricular systolic function is normal. Post stress the ejection fraction is > 60%. Minimal coronary calcifications are noted. Status post cholecystectomy.    Cardiac monitor:  From 10/16/20 showed patient had a min HR of 48 bpm, max HR of 169 bpm, and avg HR of 80 bpm. Predominant underlying rhythm was Sinus Rhythm. 1 run of Supraventricular Tachycardia occurred lasting 4 beats with a max rate of 169 bpm (avg 150 bpm). Isolated SVEs were rare (<1.0%), SVE Couplets were rare (<1.0%), and SVE Triplets were rare (<1.0%). Isolated VEs were rare (<1.0%), VE Couplets were rare (<1.0%), and no VE Triplets were Present. There were 55 patient triggered events.  49 of the episodes corresponded to normal sinus rhythm.  6 episodes corresponded to isolated PVCs.      Glade Stanford, AGNP-C  Cardiology Nurse Practitioner  [facility]

## 2023-04-22 NOTE — Unmapped (Signed)
Pt in for routine follow up.

## 2023-04-23 NOTE — Unmapped (Addendum)
Get labs    Flu shot today    Great job with exercise and cutting back smoking

## 2023-04-28 NOTE — Unmapped (Signed)
Specialty Medication(s): Repatha    Ms.Filipiak has been dis-enrolled from the [facility] specialty pharmacy services due to a pharmacy change. The patient is now filling at [facility] .    Additional information provided to the patient: n/a    Camillo Flaming, PharmD  [facility] Specialty Pharmacist

## 2023-04-30 ENCOUNTER — Ambulatory Visit: Admit: 2023-04-30 | Discharge: 2023-05-01 | Payer: MEDICARE

## 2023-04-30 DIAGNOSIS — K219 Gastro-esophageal reflux disease without esophagitis: Principal | ICD-10-CM

## 2023-04-30 DIAGNOSIS — R1310 Dysphagia, unspecified: Principal | ICD-10-CM

## 2023-05-19 DIAGNOSIS — R1013 Epigastric pain: Principal | ICD-10-CM

## 2023-05-19 DIAGNOSIS — Q394 Esophageal web: Principal | ICD-10-CM

## 2023-05-19 DIAGNOSIS — K219 Gastro-esophageal reflux disease without esophagitis: Principal | ICD-10-CM

## 2023-05-19 MED ORDER — OMEPRAZOLE 20 MG CAPSULE,DELAYED RELEASE
ORAL_CAPSULE | Freq: Every day | ORAL | 1 refills | 180 days | Status: CP
Start: 2023-05-19 — End: 2024-05-18

## 2023-05-22 MED ORDER — PEG 3350-ELECTROLYTES 236 GRAM-22.74 GRAM-6.74 GRAM-5.86 GRAM SOLUTION
0 refills | 0 days | Status: CP
Start: 2023-05-22 — End: ?

## 2023-06-04 ENCOUNTER — Ambulatory Visit: Admit: 2023-06-04 | Discharge: 2023-06-05 | Payer: MEDICARE

## 2023-06-04 DIAGNOSIS — I2510 Atherosclerotic heart disease of native coronary artery without angina pectoris: Principal | ICD-10-CM

## 2023-06-04 DIAGNOSIS — Z1231 Encounter for screening mammogram for malignant neoplasm of breast: Principal | ICD-10-CM

## 2023-06-04 DIAGNOSIS — E559 Vitamin D deficiency, unspecified: Principal | ICD-10-CM

## 2023-06-04 DIAGNOSIS — G8929 Other chronic pain: Principal | ICD-10-CM

## 2023-06-04 DIAGNOSIS — M2550 Pain in unspecified joint: Principal | ICD-10-CM

## 2023-06-04 DIAGNOSIS — G459 Transient cerebral ischemic attack, unspecified: Principal | ICD-10-CM

## 2023-06-04 DIAGNOSIS — M5440 Lumbago with sciatica, unspecified side: Principal | ICD-10-CM

## 2023-06-04 DIAGNOSIS — R5383 Other fatigue: Principal | ICD-10-CM

## 2023-06-04 DIAGNOSIS — R7309 Other abnormal glucose: Principal | ICD-10-CM

## 2023-06-04 DIAGNOSIS — Z0000 Encounter for general adult medical examination without abnormal findings: Principal | ICD-10-CM

## 2023-06-09 ENCOUNTER — Ambulatory Visit: Admit: 2023-06-09 | Discharge: 2023-06-10 | Payer: MEDICARE

## 2023-06-09 DIAGNOSIS — R928 Other abnormal and inconclusive findings on diagnostic imaging of breast: Principal | ICD-10-CM

## 2023-06-15 DIAGNOSIS — M2550 Pain in unspecified joint: Principal | ICD-10-CM

## 2023-06-15 DIAGNOSIS — E559 Vitamin D deficiency, unspecified: Principal | ICD-10-CM

## 2023-06-15 MED ORDER — ERGOCALCIFEROL (VITAMIN D2) 1,250 MCG (50,000 UNIT) CAPSULE
ORAL_CAPSULE | 2 refills | 0.00 days | Status: CP
Start: 2023-06-15 — End: ?

## 2023-06-15 MED ORDER — PREDNISONE 20 MG TABLET
ORAL_TABLET | 0 refills | 0.00 days | Status: CP
Start: 2023-06-15 — End: ?

## 2023-06-22 MED ORDER — PEG 3350-ELECTROLYTES 236 GRAM-22.74 GRAM-6.74 GRAM-5.86 GRAM SOLUTION
Freq: Once | ORAL | 0 refills | 1.00 days | Status: CP
Start: 2023-06-22 — End: 2023-06-22

## 2023-06-22 MED ORDER — BISACODYL 5 MG TABLET,DELAYED RELEASE
ORAL_TABLET | ORAL | 0 refills | 0.00 days | Status: CP
Start: 2023-06-22 — End: ?

## 2023-06-22 MED ORDER — POLYETHYLENE GLYCOL 3350 17 GRAM/DOSE ORAL POWDER
0 refills | 0.00 days | Status: CP
Start: 2023-06-22 — End: ?

## 2023-06-25 MED ORDER — ATORVASTATIN 10 MG TABLET
ORAL_TABLET | Freq: Every day | ORAL | 3 refills | 0.00 days
Start: 2023-06-25 — End: ?

## 2023-06-26 MED ORDER — ATORVASTATIN 10 MG TABLET
ORAL_TABLET | Freq: Every day | ORAL | 3 refills | 90 days | Status: CP
Start: 2023-06-26 — End: ?

## 2023-07-01 ENCOUNTER — Other Ambulatory Visit: Payer: Self-pay | Admitting: Medical Genetics

## 2023-07-08 ENCOUNTER — Other Ambulatory Visit: Payer: Self-pay | Attending: Medical Genetics

## 2023-07-12 DIAGNOSIS — B379 Candidiasis, unspecified: Principal | ICD-10-CM

## 2023-07-12 MED ORDER — FLUCONAZOLE 150 MG TABLET
ORAL_TABLET | Freq: Once | ORAL | 0 refills | 1.00 days | Status: CP
Start: 2023-07-12 — End: 2023-07-12

## 2023-07-17 ENCOUNTER — Inpatient Hospital Stay: Admit: 2023-07-17 | Discharge: 2023-07-18 | Payer: MEDICARE

## 2023-07-17 DIAGNOSIS — R928 Other abnormal and inconclusive findings on diagnostic imaging of breast: Principal | ICD-10-CM

## 2023-07-23 MED ORDER — PEG 3350-ELECTROLYTES 236 GRAM-22.74 GRAM-6.74 GRAM-5.86 GRAM SOLUTION
0 refills | 0.00 days | Status: CP
Start: 2023-07-23 — End: ?

## 2023-09-14 DIAGNOSIS — K581 Irritable bowel syndrome with constipation: Principal | ICD-10-CM

## 2023-09-14 MED ORDER — LINZESS 145 MCG CAPSULE
ORAL_CAPSULE | Freq: Every day | ORAL | 1 refills | 90 days | Status: CP
Start: 2023-09-14 — End: ?

## 2023-10-19 ENCOUNTER — Other Ambulatory Visit
Admission: RE | Admit: 2023-10-19 | Discharge: 2023-10-19 | Disposition: A | Discharge status: Home / Self Care | Payer: Self-pay | Source: Ambulatory Visit | Attending: Medical Genetics | Admitting: Medical Genetics

## 2023-10-19 ENCOUNTER — Inpatient Hospital Stay: Admit: 2023-10-19 | Discharge: 2023-10-20 | Payer: Medicare (Managed Care)

## 2023-10-19 DIAGNOSIS — R928 Other abnormal and inconclusive findings on diagnostic imaging of breast: Principal | ICD-10-CM

## 2023-10-30 LAB — GENECONNECT MOLECULAR SCREEN: Genetic Analysis Overall Interpretation: NEGATIVE

## 2023-11-20 DIAGNOSIS — R1013 Epigastric pain: Principal | ICD-10-CM

## 2023-11-20 MED ORDER — OMEPRAZOLE 20 MG CAPSULE,DELAYED RELEASE
ORAL_CAPSULE | Freq: Every day | ORAL | 1 refills | 180.00000 days
Start: 2023-11-20 — End: 2024-11-19

## 2023-12-09 DIAGNOSIS — R635 Abnormal weight gain: Principal | ICD-10-CM

## 2023-12-09 DIAGNOSIS — K581 Irritable bowel syndrome with constipation: Principal | ICD-10-CM

## 2023-12-09 MED ORDER — LINZESS 145 MCG CAPSULE
ORAL_CAPSULE | Freq: Every day | ORAL | 1 refills | 90.00000 days | Status: CP
Start: 2023-12-09 — End: ?

## 2023-12-09 MED ORDER — FUROSEMIDE 20 MG TABLET
ORAL_TABLET | Freq: Two times a day (BID) | ORAL | 1 refills | 90.00000 days | Status: CP
Start: 2023-12-09 — End: ?

## 2023-12-10 ENCOUNTER — Ambulatory Visit
Admit: 2023-12-10 | Payer: Medicare (Managed Care) | Attending: Student in an Organized Health Care Education/Training Program | Primary: Student in an Organized Health Care Education/Training Program

## 2024-01-07 ENCOUNTER — Encounter
Admit: 2024-01-07 | Discharge: 2024-01-07 | Payer: Medicare (Managed Care) | Attending: Anesthesiology | Primary: Anesthesiology

## 2024-01-07 ENCOUNTER — Inpatient Hospital Stay: Admit: 2024-01-07 | Discharge: 2024-01-07 | Payer: Medicare (Managed Care)

## 2024-01-16 ENCOUNTER — Emergency Department: Admission: EM | Admit: 2024-01-16 | Discharge: 2024-01-16 | Disposition: A | Discharge status: Home / Self Care

## 2024-01-16 DIAGNOSIS — R519 Headache, unspecified: Secondary | ICD-10-CM

## 2024-01-16 DIAGNOSIS — J45909 Unspecified asthma, uncomplicated: Secondary | ICD-10-CM | POA: Diagnosis not present

## 2024-01-16 DIAGNOSIS — J020 Streptococcal pharyngitis: Secondary | ICD-10-CM | POA: Diagnosis not present

## 2024-01-16 DIAGNOSIS — R509 Fever, unspecified: Secondary | ICD-10-CM | POA: Diagnosis present

## 2024-01-16 LAB — RESP PANEL BY RT-PCR (RSV, FLU A&B, COVID)  RVPGX2
Influenza A by PCR: NEGATIVE
Influenza B by PCR: NEGATIVE
Resp Syncytial Virus by PCR: NEGATIVE
SARS Coronavirus 2 by RT PCR: NEGATIVE

## 2024-01-16 LAB — GROUP A STREP BY PCR: Group A Strep by PCR: DETECTED — AB

## 2024-01-16 MED ORDER — NYSTATIN 100000 UNIT/ML MT SUSP: 5.0000 mL | Freq: Three times a day (TID) | OROMUCOSAL | 0 refills | Status: AC

## 2024-01-16 MED ORDER — CLINDAMYCIN HCL 150 MG PO CAPS
300.0000 mg | ORAL_CAPSULE | Freq: Once | ORAL | Status: AC
  Administered 2024-01-16: 300 mg via ORAL
  Filled 2024-01-16: qty 2

## 2024-01-16 MED ORDER — CLINDAMYCIN HCL 150 MG PO CAPS: 300.0000 mg | ORAL_CAPSULE | Freq: Three times a day (TID) | ORAL | 0 refills | Status: AC

## 2024-01-16 MED ORDER — KETOROLAC TROMETHAMINE 15 MG/ML IJ SOLN
15.0000 mg | Freq: Once | INTRAMUSCULAR | Status: AC
  Administered 2024-01-16: 15 mg via INTRAMUSCULAR
  Filled 2024-01-16: qty 1

## 2024-01-16 NOTE — Discharge Instructions (Signed)
 Follow-up with your regular doctor if not improved in 3 days.  Return for worsening. Take your medication as prescribed Gargle with warm salt water

## 2024-01-16 NOTE — ED Provider Notes (Signed)
 [facility] Provider Note    Event Date/Time   First MD Initiated Contact with Patient 01/16/24 1201     (approximate)   History   Sore Throat and Fever   HPI  Kendra Clark is a 48 y.o. female osteoporosis, IV hyperlipidemia asthma bipolar urgency department with sore throat and headache.  Patient states sore throat is been ongoing since Tuesday.  No fever.  States she has very sore throat.  Gargle salt water this morning with no relief.  Has had some diarrhea.      Physical Exam   Triage Vital Signs: ED Triage Vitals  Encounter Vitals Group     BP 01/16/24 1135 121/85     Girls Systolic BP Percentile --      Girls Diastolic BP Percentile --      Boys Systolic BP Percentile --      Boys Diastolic BP Percentile --      Pulse Rate 01/16/24 1135 95     Resp 01/16/24 1135 20     Temp 01/16/24 1135 99.6 F (37.6 C)     Temp Source 01/16/24 1135 Oral     SpO2 01/16/24 1135 100 %     Weight --      Height --      Head Circumference --      Peak Flow --      Pain Score 01/16/24 1134 7     Pain Loc --      Pain Education --      Exclude from Growth Chart --     Most recent vital signs: Vitals:   01/16/24 1135  BP: 121/85  Pulse: 95  Resp: 20  Temp: 99.6 F (37.6 C)  SpO2: 100%     General: Awake, no distress.   CV:  Good peripheral perfusion.  Resp:  Normal effort.  Abd:  No distention.   Other:  Throat is red no swelling, neck supple, no lymphadenopathy   ED Results / Procedures / Treatments   Labs (all labs ordered are listed, but only abnormal results are displayed) Labs Reviewed  GROUP A STREP BY PCR - Abnormal; Notable for the following components:      Result Value   Group A Strep by PCR DETECTED (*)    All other components within normal limits  RESP PANEL BY RT-PCR (RSV, FLU A&B, COVID)  [id number]     EKG     RADIOLOGY     PROCEDURES:   Procedures  Critical Care:  no Chief Complaint  Patient  presents with   Sore Throat   Fever      MEDICATIONS ORDERED IN ED: Medications  ketorolac  (TORADOL ) 15 MG/ML injection 15 mg (15 mg Intramuscular Given 01/16/24 1246)  clindamycin  (CLEOCIN ) capsule 300 mg (300 mg Oral Given 01/16/24 1247)     IMPRESSION / MDM / ASSESSMENT AND PLAN / ED COURSE  I reviewed the triage vital signs and the nursing notes.                              Differential diagnosis includes, but is not limited to, strep throat, COVID, influenza, RSV, mono, viral illness  Patient's presentation is most consistent with acute illness / injury with system symptoms.    Respiratory panel reassuring  Strep test positive  Will give the patient Toradol  for her headache, 1 dose of clindamycin  here in the ED.  She is  to follow-up with her regular doctor if not improving in 3 days.  Return if worsening.  Work note provided.  She is given a prescription for clindamycin  and [facility] Magic mouthwash for pain.  Discharged in stable condition.  In agreement treatment plan.      FINAL CLINICAL IMPRESSION(S) / ED DIAGNOSES   Final diagnoses:  Acute streptococcal pharyngitis  Bad headache     Rx / DC Orders   ED Discharge Orders          Ordered    clindamycin  (CLEOCIN ) 150 MG capsule  3 times daily        01/16/24 1234    magic mouthwash (nystatin , hydrocortisone, diphenhydrAMINE, lidocaine ) suspension  3 times daily       Note to Pharmacy: Equal parts of each medication   01/16/24 1234             Note:  This document was prepared using Dragon voice recognition software and may include unintentional dictation errors.    Gasper Devere ORN, PA-C 01/16/24 1249    Nicholaus Rolland BRAVO, MD 01/16/24 [phone]

## 2024-01-16 NOTE — ED Triage Notes (Signed)
 Pt to ED from home with sore throat/fever since Tuesday. Pt states she feels like there is something stuck in my throat, respirations unlabored even in triage. Pt speaking in complete sentences.

## 2024-01-20 DIAGNOSIS — T63301A Toxic effect of unspecified spider venom, accidental (unintentional), initial encounter: Principal | ICD-10-CM

## 2024-01-20 DIAGNOSIS — J020 Streptococcal pharyngitis: Principal | ICD-10-CM

## 2024-01-20 DIAGNOSIS — L309 Dermatitis, unspecified: Principal | ICD-10-CM

## 2024-01-20 MED ORDER — TRIAMCINOLONE ACETONIDE 0.1 % TOPICAL CREAM
Freq: Two times a day (BID) | TOPICAL | 0 refills | 0.00000 days | Status: CP
Start: 2024-01-20 — End: 2025-01-19

## 2024-01-30 DIAGNOSIS — W57XXXA Bitten or stung by nonvenomous insect and other nonvenomous arthropods, initial encounter: Principal | ICD-10-CM

## 2024-01-30 DIAGNOSIS — L309 Dermatitis, unspecified: Principal | ICD-10-CM

## 2024-01-30 MED ORDER — DOXYCYCLINE HYCLATE 100 MG CAPSULE
ORAL_CAPSULE | Freq: Two times a day (BID) | ORAL | 0 refills | 7.00000 days | Status: CP
Start: 2024-01-30 — End: 2024-02-06

## 2024-01-30 MED ORDER — TRIAMCINOLONE ACETONIDE 0.1 % TOPICAL OINTMENT
Freq: Two times a day (BID) | TOPICAL | 1 refills | 0.00000 days | Status: CP
Start: 2024-01-30 — End: 2025-01-29

## 2024-02-25 ENCOUNTER — Encounter: Admit: 2024-02-25 | Discharge: 2024-02-25 | Payer: Medicare (Managed Care)

## 2024-02-25 DIAGNOSIS — F419 Anxiety disorder, unspecified: Principal | ICD-10-CM

## 2024-02-25 DIAGNOSIS — K581 Irritable bowel syndrome with constipation: Principal | ICD-10-CM

## 2024-02-25 DIAGNOSIS — Z124 Encounter for screening for malignant neoplasm of cervix: Principal | ICD-10-CM

## 2024-02-25 DIAGNOSIS — F4321 Adjustment disorder with depressed mood: Principal | ICD-10-CM

## 2024-02-25 DIAGNOSIS — R7309 Other abnormal glucose: Principal | ICD-10-CM

## 2024-02-25 MED ORDER — CLONAZEPAM 0.5 MG TABLET
ORAL_TABLET | Freq: Two times a day (BID) | ORAL | 0 refills | 5.00000 days | Status: CP
Start: 2024-02-25 — End: 2025-02-24

## 2024-02-25 MED ORDER — LINZESS 290 MCG CAPSULE
ORAL_CAPSULE | Freq: Every day | ORAL | 3 refills | 90.00000 days | Status: CP
Start: 2024-02-25 — End: ?

## 2024-04-04 DIAGNOSIS — R051 Acute cough: Principal | ICD-10-CM

## 2024-04-04 DIAGNOSIS — J988 Other specified respiratory disorders: Principal | ICD-10-CM

## 2024-04-04 DIAGNOSIS — J45901 Unspecified asthma with (acute) exacerbation: Principal | ICD-10-CM

## 2024-04-04 MED ORDER — DOXYCYCLINE HYCLATE 100 MG CAPSULE
ORAL_CAPSULE | Freq: Two times a day (BID) | ORAL | 0 refills | 7.00000 days | Status: CP
Start: 2024-04-04 — End: 2024-04-11

## 2024-04-04 MED ORDER — AZELASTINE 137 MCG (0.1 %) NASAL SPRAY
Freq: Two times a day (BID) | NASAL | 1 refills | 109.00000 days | Status: CP
Start: 2024-04-04 — End: 2025-04-04

## 2024-04-04 MED ORDER — IPRATROPIUM 0.5 MG-ALBUTEROL 3 MG (2.5 MG BASE)/3 ML NEBULIZATION SOLN
Freq: Four times a day (QID) | RESPIRATORY_TRACT | 0 refills | 7.00000 days | Status: CP | PRN
Start: 2024-04-04 — End: ?

## 2024-04-04 MED ORDER — PREDNISONE 20 MG TABLET
ORAL_TABLET | Freq: Every day | ORAL | 0 refills | 5.00000 days | Status: CP
Start: 2024-04-04 — End: 2024-04-09

## 2024-04-09 DIAGNOSIS — E785 Hyperlipidemia, unspecified: Principal | ICD-10-CM

## 2024-04-09 MED ORDER — REPATHA SURECLICK 140 MG/ML SUBCUTANEOUS PEN INJECTOR
SUBCUTANEOUS | 3 refills | 0.00000 days
Start: 2024-04-09 — End: ?

## 2024-04-11 MED ORDER — REPATHA SURECLICK 140 MG/ML SUBCUTANEOUS PEN INJECTOR
SUBCUTANEOUS | 0 refills | 84.00000 days | Status: CP
Start: 2024-04-11 — End: ?

## 2024-04-13 ENCOUNTER — Encounter: Admit: 2024-04-13 | Discharge: 2024-04-13 | Payer: Medicare (Managed Care)

## 2024-04-27 MED ORDER — AZELASTINE 137 MCG (0.1 %) NASAL SPRAY
Freq: Two times a day (BID) | NASAL | 1 refills | 109.00000 days | Status: CP
Start: 2024-04-27 — End: 2025-04-27

## 2024-05-17 DIAGNOSIS — T50905A Adverse effect of unspecified drugs, medicaments and biological substances, initial encounter: Principal | ICD-10-CM

## 2024-05-17 DIAGNOSIS — T783XXA Angioneurotic edema, initial encounter: Principal | ICD-10-CM

## 2024-05-17 MED ORDER — METHYLPREDNISOLONE 4 MG TABLETS IN A DOSE PACK
ORAL | 0 refills | 0.00000 days | Status: CP
Start: 2024-05-17 — End: ?

## 2024-05-17 MED ORDER — EPINEPHRINE 0.3 MG/0.3 ML INJECTION, AUTO-INJECTOR
Freq: Once | INTRAMUSCULAR | 0 refills | 1.00000 days | Status: CP
Start: 2024-05-17 — End: 2024-05-17

## 2024-06-30 DIAGNOSIS — R635 Abnormal weight gain: Principal | ICD-10-CM

## 2024-06-30 MED ORDER — FUROSEMIDE 20 MG TABLET
ORAL_TABLET | Freq: Two times a day (BID) | ORAL | 1 refills | 90.00000 days | Status: CP
Start: 2024-06-30 — End: ?

## 2024-07-03 DIAGNOSIS — E785 Hyperlipidemia, unspecified: Principal | ICD-10-CM

## 2024-07-03 MED ORDER — REPATHA SURECLICK 140 MG/ML SUBCUTANEOUS PEN INJECTOR
SUBCUTANEOUS | 0.00000 days
Start: 2024-07-03 — End: ?

## 2024-07-05 MED ORDER — REPATHA SURECLICK 140 MG/ML SUBCUTANEOUS PEN INJECTOR
SUBCUTANEOUS | 0.00000 days
Start: 2024-07-05 — End: ?

## 2024-07-06 MED ORDER — ALIROCUMAB 75 MG/ML SUBCUTANEOUS PEN INJECTOR
SUBCUTANEOUS | 11 refills | 0.00000 days | Status: CP
Start: 2024-07-06 — End: ?

## 2024-07-14 MED ORDER — ATORVASTATIN 10 MG TABLET
ORAL_TABLET | Freq: Every day | ORAL | 3 refills | 90.00000 days | Status: CP
Start: 2024-07-14 — End: ?
